# Patient Record
Sex: Male | Born: 1980 | Race: White | Hispanic: No | Marital: Married | Smoking: Current every day smoker
Health system: Southern US, Community
[De-identification: ages and names within clinical notes are randomized; demographics above are authoritative.]

## PROBLEM LIST (undated history)

## (undated) DIAGNOSIS — G4730 Sleep apnea, unspecified: Secondary | ICD-10-CM

## (undated) DIAGNOSIS — F909 Attention-deficit hyperactivity disorder, unspecified type: Secondary | ICD-10-CM

## (undated) DIAGNOSIS — T7840XA Allergy, unspecified, initial encounter: Secondary | ICD-10-CM

## (undated) DIAGNOSIS — F32A Depression, unspecified: Secondary | ICD-10-CM

## (undated) DIAGNOSIS — I301 Infective pericarditis: Secondary | ICD-10-CM

## (undated) DIAGNOSIS — F4310 Post-traumatic stress disorder, unspecified: Secondary | ICD-10-CM

## (undated) DIAGNOSIS — G40909 Epilepsy, unspecified, not intractable, without status epilepticus: Secondary | ICD-10-CM

## (undated) DIAGNOSIS — F329 Major depressive disorder, single episode, unspecified: Secondary | ICD-10-CM

## (undated) DIAGNOSIS — E119 Type 2 diabetes mellitus without complications: Secondary | ICD-10-CM

## (undated) DIAGNOSIS — M722 Plantar fascial fibromatosis: Secondary | ICD-10-CM

## (undated) DIAGNOSIS — G43909 Migraine, unspecified, not intractable, without status migrainosus: Secondary | ICD-10-CM

## (undated) DIAGNOSIS — Z21 Asymptomatic human immunodeficiency virus [HIV] infection status: Secondary | ICD-10-CM

## (undated) DIAGNOSIS — M1990 Unspecified osteoarthritis, unspecified site: Secondary | ICD-10-CM

## (undated) DIAGNOSIS — G4733 Obstructive sleep apnea (adult) (pediatric): Secondary | ICD-10-CM

## (undated) DIAGNOSIS — B20 Human immunodeficiency virus [HIV] disease: Secondary | ICD-10-CM

## (undated) HISTORY — DX: Type 2 diabetes mellitus without complications: E11.9

## (undated) HISTORY — DX: Allergy, unspecified, initial encounter: T78.40XA

## (undated) HISTORY — DX: Depression, unspecified: F32.A

## (undated) HISTORY — DX: Plantar fascial fibromatosis: M72.2

## (undated) HISTORY — DX: Obstructive sleep apnea (adult) (pediatric): G47.33

## (undated) HISTORY — PX: HAND SURGERY: SHX662

## (undated) HISTORY — DX: Sleep apnea, unspecified: G47.30

## (undated) HISTORY — PX: OTHER SURGICAL HISTORY: SHX169

## (undated) HISTORY — DX: Post-traumatic stress disorder, unspecified: F43.10

## (undated) HISTORY — DX: Major depressive disorder, single episode, unspecified: F32.9

## (undated) HISTORY — DX: Asymptomatic human immunodeficiency virus (hiv) infection status: Z21

## (undated) HISTORY — DX: Attention-deficit hyperactivity disorder, unspecified type: F90.9

## (undated) HISTORY — DX: Unspecified osteoarthritis, unspecified site: M19.90

## (undated) HISTORY — DX: Migraine, unspecified, not intractable, without status migrainosus: G43.909

## (undated) HISTORY — DX: Human immunodeficiency virus (HIV) disease: B20

---

## 2015-11-29 ENCOUNTER — Other Ambulatory Visit: Payer: Self-pay

## 2015-11-29 ENCOUNTER — Encounter: Payer: Self-pay | Admitting: Neurology

## 2015-11-29 ENCOUNTER — Ambulatory Visit (INDEPENDENT_AMBULATORY_CARE_PROVIDER_SITE_OTHER): Payer: 59 | Admitting: Neurology

## 2015-11-29 VITALS — BP 124/84 | HR 80 | Resp 20 | Ht 68.0 in | Wt 178.0 lb

## 2015-11-29 DIAGNOSIS — G4733 Obstructive sleep apnea (adult) (pediatric): Secondary | ICD-10-CM | POA: Diagnosis not present

## 2015-11-29 DIAGNOSIS — G4710 Hypersomnia, unspecified: Secondary | ICD-10-CM | POA: Diagnosis not present

## 2015-11-29 DIAGNOSIS — B20 Human immunodeficiency virus [HIV] disease: Secondary | ICD-10-CM

## 2015-11-29 DIAGNOSIS — R0683 Snoring: Secondary | ICD-10-CM

## 2015-11-29 DIAGNOSIS — G4730 Sleep apnea, unspecified: Secondary | ICD-10-CM

## 2015-11-29 DIAGNOSIS — E782 Mixed hyperlipidemia: Secondary | ICD-10-CM

## 2015-11-29 MED ORDER — ELVITEG-COBIC-EMTRICIT-TENOFAF 150-150-200-10 MG PO TABS: 1.0000 | ORAL_TABLET | Freq: Every day | ORAL | Status: DC

## 2015-11-29 NOTE — Progress Notes (Signed)
New transfer. New patient labs needed.  Orders added.  Laurell Josephs, RN

## 2015-11-29 NOTE — Patient Instructions (Signed)

## 2015-11-29 NOTE — Telephone Encounter (Signed)
Patient is transferring care from [place] to [facility].  He will be out of medications in 4 days.   I have received his medical records and scheduled hiim for office visit and labs. Medical records will be in transfer folder.   Laurell Josephs, RN
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
## 2015-12-02 ENCOUNTER — Other Ambulatory Visit: Payer: Self-pay

## 2015-12-06 ENCOUNTER — Telehealth: Payer: Self-pay

## 2015-12-06 NOTE — Telephone Encounter (Signed)
Patient is call ing to say he is going to Primary Care on Tuesday for labs and wants to know if we can add our labs. I explained to the patient the need for speciality labs and it would be better to add labs here at our office  He will come on Tuesday, December 07, 2015.

## 2015-12-07 ENCOUNTER — Other Ambulatory Visit: Payer: 59

## 2015-12-07 ENCOUNTER — Other Ambulatory Visit: Payer: Self-pay

## 2015-12-07 DIAGNOSIS — E782 Mixed hyperlipidemia: Secondary | ICD-10-CM

## 2015-12-07 DIAGNOSIS — B20 Human immunodeficiency virus [HIV] disease: Secondary | ICD-10-CM

## 2015-12-07 DIAGNOSIS — Z136 Encounter for screening for cardiovascular disorders: Secondary | ICD-10-CM

## 2015-12-07 LAB — CBC WITH DIFFERENTIAL/PLATELET
BASOS ABS: 0 10*3/uL (ref 0.0–0.1)
BASOS PCT: 0 % (ref 0–1)
EOS PCT: 3 % (ref 0–5)
Eosinophils Absolute: 0.2 10*3/uL (ref 0.0–0.7)
HCT: 45 % (ref 39.0–52.0)
HEMOGLOBIN: 15.6 g/dL (ref 13.0–17.0)
LYMPHS PCT: 40 % (ref 12–46)
Lymphs Abs: 2.5 10*3/uL (ref 0.7–4.0)
MCH: 30.4 pg (ref 26.0–34.0)
MCHC: 34.7 g/dL (ref 30.0–36.0)
MCV: 87.5 fL (ref 78.0–100.0)
MONO ABS: 0.6 10*3/uL (ref 0.1–1.0)
MONOS PCT: 9 % (ref 3–12)
MPV: 9.8 fL (ref 8.6–12.4)
NEUTROS ABS: 3 10*3/uL (ref 1.7–7.7)
Neutrophils Relative %: 48 % (ref 43–77)
PLATELETS: 285 10*3/uL (ref 150–400)
RBC: 5.14 MIL/uL (ref 4.22–5.81)
RDW: 12.7 % (ref 11.5–15.5)
WBC: 6.3 10*3/uL (ref 4.0–10.5)

## 2015-12-07 LAB — COMPLETE METABOLIC PANEL WITH GFR
ALBUMIN: 4.4 g/dL (ref 3.6–5.1)
ALK PHOS: 58 U/L (ref 40–115)
ALT: 104 U/L — AB (ref 9–46)
AST: 39 U/L (ref 10–40)
BILIRUBIN TOTAL: 0.5 mg/dL (ref 0.2–1.2)
BUN: 15 mg/dL (ref 7–25)
CO2: 29 mmol/L (ref 20–31)
CREATININE: 0.99 mg/dL (ref 0.60–1.35)
Calcium: 9.5 mg/dL (ref 8.6–10.3)
Chloride: 100 mmol/L (ref 98–110)
GFR, Est Non African American: >89 mL/min (ref >=60)
GLUCOSE: 118 mg/dL — AB (ref 65–99)
Potassium: 4.7 mmol/L (ref 3.5–5.3)
SODIUM: 140 mmol/L (ref 135–146)
TOTAL PROTEIN: 7.4 g/dL (ref 6.1–8.1)

## 2015-12-07 LAB — TSH: TSH: 1.17 m[IU]/L (ref 0.40–4.50)

## 2015-12-07 LAB — LIPID PANEL
Cholesterol: 202 mg/dL — ABNORMAL HIGH (ref 125–200)
HDL: 30 mg/dL — ABNORMAL LOW (ref >=40)
LDL CALC: 132 mg/dL — AB (ref <130)
TRIGLYCERIDES: 198 mg/dL — AB (ref <150)
Total CHOL/HDL Ratio: 6.7 Ratio — ABNORMAL HIGH (ref <=5.0)
VLDL: 40 mg/dL — AB (ref <30)

## 2015-12-08 LAB — URINALYSIS
Bilirubin Urine: NEGATIVE
Glucose, UA: NEGATIVE
HGB URINE DIPSTICK: NEGATIVE
Ketones, ur: NEGATIVE
LEUKOCYTES UA: NEGATIVE
NITRITE: NEGATIVE
PH: 6 (ref 5.0–8.0)
PROTEIN: NEGATIVE
Specific Gravity, Urine: 1.023 (ref 1.001–1.035)

## 2015-12-08 LAB — HEPATITIS C ANTIBODY: HCV Ab: NEGATIVE

## 2015-12-08 LAB — RPR

## 2015-12-08 LAB — HEPATITIS A ANTIBODY, TOTAL: HEP A TOTAL AB: REACTIVE — AB

## 2015-12-08 LAB — HEPATITIS B SURFACE ANTIBODY,QUALITATIVE: Hep B S Ab: POSITIVE — AB

## 2015-12-08 LAB — HIV-1 RNA ULTRAQUANT REFLEX TO GENTYP+
HIV 1 RNA Quant: <20 copies/mL (ref <20)
HIV-1 RNA Quant, Log: <1.3 Log copies/mL (ref <1.30)

## 2015-12-08 LAB — URINE CYTOLOGY ANCILLARY ONLY
Chlamydia: NEGATIVE
Neisseria Gonorrhea: NEGATIVE

## 2015-12-08 LAB — T-HELPER CELL (CD4) - (RCID CLINIC ONLY)
CD4 % Helper T Cell: 33 % (ref 33–55)
CD4 T CELL ABS: 830 /uL (ref 400–2700)

## 2015-12-08 LAB — HEPATITIS B CORE ANTIBODY, TOTAL: Hep B Core Total Ab: NONREACTIVE

## 2015-12-08 LAB — HEPATITIS B SURFACE ANTIGEN: Hepatitis B Surface Ag: NEGATIVE

## 2015-12-09 LAB — QUANTIFERON TB GOLD ASSAY (BLOOD)
INTERFERON GAMMA RELEASE ASSAY: NEGATIVE
Mitogen value: >10 IU/mL
QUANTIFERON TB AG MINUS NIL: 0.1 [IU]/mL
Quantiferon Nil Value: 0.05 IU/mL
TB AG VALUE: 0.15 [IU]/mL

## 2015-12-15 ENCOUNTER — Encounter: Payer: Self-pay | Admitting: Internal Medicine

## 2015-12-15 DIAGNOSIS — Z87891 Personal history of nicotine dependence: Secondary | ICD-10-CM | POA: Insufficient documentation

## 2015-12-15 DIAGNOSIS — F4310 Post-traumatic stress disorder, unspecified: Secondary | ICD-10-CM | POA: Insufficient documentation

## 2015-12-15 DIAGNOSIS — F909 Attention-deficit hyperactivity disorder, unspecified type: Secondary | ICD-10-CM | POA: Insufficient documentation

## 2015-12-15 DIAGNOSIS — E785 Hyperlipidemia, unspecified: Secondary | ICD-10-CM | POA: Insufficient documentation

## 2015-12-15 DIAGNOSIS — G4730 Sleep apnea, unspecified: Secondary | ICD-10-CM | POA: Insufficient documentation

## 2015-12-15 DIAGNOSIS — R739 Hyperglycemia, unspecified: Secondary | ICD-10-CM | POA: Insufficient documentation

## 2015-12-15 DIAGNOSIS — B20 Human immunodeficiency virus [HIV] disease: Secondary | ICD-10-CM | POA: Insufficient documentation

## 2015-12-15 LAB — HLA B*5701: HLA-B 5701 W/RFLX HLA-B HIGH: NEGATIVE

## 2015-12-16 ENCOUNTER — Encounter: Payer: Self-pay | Admitting: Internal Medicine

## 2015-12-16 ENCOUNTER — Ambulatory Visit: Payer: 59 | Admitting: *Deleted

## 2015-12-16 ENCOUNTER — Ambulatory Visit (INDEPENDENT_AMBULATORY_CARE_PROVIDER_SITE_OTHER): Payer: 59 | Admitting: Internal Medicine

## 2015-12-16 VITALS — BP 134/87 | HR 77 | Temp 98.4°F | Ht 68.0 in | Wt 192.0 lb

## 2015-12-16 DIAGNOSIS — R739 Hyperglycemia, unspecified: Secondary | ICD-10-CM

## 2015-12-16 DIAGNOSIS — E785 Hyperlipidemia, unspecified: Secondary | ICD-10-CM | POA: Diagnosis not present

## 2015-12-16 DIAGNOSIS — F4310 Post-traumatic stress disorder, unspecified: Secondary | ICD-10-CM

## 2015-12-16 DIAGNOSIS — G4730 Sleep apnea, unspecified: Secondary | ICD-10-CM | POA: Diagnosis not present

## 2015-12-16 DIAGNOSIS — B20 Human immunodeficiency virus [HIV] disease: Secondary | ICD-10-CM | POA: Diagnosis not present

## 2015-12-16 DIAGNOSIS — Z87891 Personal history of nicotine dependence: Secondary | ICD-10-CM

## 2015-12-16 NOTE — Assessment & Plan Note (Signed)
I talked to him about our approach to his healthcare. We have a multidisciplinary team including behavioral health counselors. He did meet with Jenel Lucks, our behavioral health counselor today and agreed to schedule a follow-up visit with her.
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
## 2015-12-16 NOTE — Assessment & Plan Note (Addendum)
His HIV infection is under excellent control. He has had complete CD4 reconstitution. He will continue on Genvoya and follow-up here after lab work in 6 months.  His wife [name] has been tested and is HIV negative. They do not use condoms. They did discuss the use of PrEP with his doctor in [place] but she never started it. I encouraged him to have her come to his next visit so we can discuss this again.

## 2015-12-16 NOTE — Assessment & Plan Note (Signed)
He has dyslipidemia. He has never been on therapy for this. Given his HIV infection and family history he will probably benefit from statin therapy. I also talked him about the importance of lifestyle modification for his lipids and mild hyperglycemia. We will consider statin therapy at the time of his next visit and he will also discuss this with his primary care physician.

## 2016-01-05 ENCOUNTER — Ambulatory Visit: Payer: 59 | Admitting: *Deleted

## 2016-01-05 DIAGNOSIS — F411 Generalized anxiety disorder: Secondary | ICD-10-CM

## 2016-01-05 DIAGNOSIS — F430 Acute stress reaction: Principal | ICD-10-CM
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
## 2016-01-10 ENCOUNTER — Ambulatory Visit: Payer: 59 | Admitting: *Deleted

## 2016-01-10 DIAGNOSIS — F411 Generalized anxiety disorder: Secondary | ICD-10-CM
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
## 2016-01-28 ENCOUNTER — Ambulatory Visit: Payer: 59 | Admitting: *Deleted

## 2016-01-31 ENCOUNTER — Ambulatory Visit: Payer: 59 | Admitting: *Deleted

## 2016-01-31 DIAGNOSIS — F411 Generalized anxiety disorder: Secondary | ICD-10-CM

## 2016-01-31 DIAGNOSIS — Z605 Target of (perceived) adverse discrimination and persecution: Secondary | ICD-10-CM

## 2016-01-31 NOTE — BH Specialist Note (Signed)
Algernon HuxleyGlen was present for his scheduled appointment today with counselor.  Patient was oriented times four with good affect and dress.  Patient was alert and talkative.  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  Patient shared that he cheats chronically no matter who he is with, watches excessive porn, is dishonest about his whereabouts at times, feels need to be satisfied sexually multiple times a day etc.  Counselor provided support and encouragement with patient.  Counselor asked patient if he feels that is sexual choices are making his life unmanageable and or if he feels powerless over how he acts sexually.  Patient said "all he above".  Counselor encouraged patient to discuss with his wife the boundaries and deal breakers regarding him using porn as a coping mechanism when his wife is not feeling well and does not want to engage in sexual intimacy.  Patient agreed he should have a conversation with his wife and make sure they are on the same page. Counselor educated patient on genetic predisposition to emotional dysregulation and or fluctuating levels of hormones.  Patient indicated that he would like to proceed with counseling to address both these areas in his life.  Patient made another appointment for two weeks out.   Jenel LucksJodi Sitlaly Gudiel, MA [facility]

## 2016-02-09 ENCOUNTER — Encounter: Payer: Self-pay | Admitting: *Deleted

## 2016-02-16 ENCOUNTER — Ambulatory Visit: Payer: 59 | Admitting: *Deleted

## 2016-02-16 DIAGNOSIS — F411 Generalized anxiety disorder: Secondary | ICD-10-CM
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
## 2016-02-28 ENCOUNTER — Other Ambulatory Visit: Payer: Self-pay | Admitting: *Deleted

## 2016-02-28 DIAGNOSIS — B20 Human immunodeficiency virus [HIV] disease: Secondary | ICD-10-CM

## 2016-02-28 MED ORDER — ELVITEG-COBIC-EMTRICIT-TENOFAF 150-150-200-10 MG PO TABS
1.0000 | ORAL_TABLET | Freq: Every day | ORAL | Status: DC
Start: 2016-02-28 — End: 2016-06-06

## 2016-02-29 ENCOUNTER — Ambulatory Visit: Payer: 59 | Admitting: *Deleted

## 2016-03-08 ENCOUNTER — Ambulatory Visit: Payer: 59 | Admitting: *Deleted

## 2016-03-15 ENCOUNTER — Ambulatory Visit: Payer: 59 | Admitting: *Deleted

## 2016-05-23 ENCOUNTER — Other Ambulatory Visit: Payer: 59

## 2016-05-23 DIAGNOSIS — B20 Human immunodeficiency virus [HIV] disease: Secondary | ICD-10-CM

## 2016-05-23 LAB — CBC
HCT: 44.9 % (ref 38.5–50.0)
HEMOGLOBIN: 15.3 g/dL (ref 13.2–17.1)
MCH: 30.8 pg (ref 27.0–33.0)
MCHC: 34.1 g/dL (ref 32.0–36.0)
MCV: 90.5 fL (ref 80.0–100.0)
MPV: 10 fL (ref 7.5–12.5)
PLATELETS: 279 10*3/uL (ref 140–400)
RBC: 4.96 MIL/uL (ref 4.20–5.80)
RDW: 13 % (ref 11.0–15.0)
WBC: 8.2 10*3/uL (ref 3.8–10.8)

## 2016-05-24 LAB — COMPREHENSIVE METABOLIC PANEL
ALBUMIN: 4.7 g/dL (ref 3.6–5.1)
ALT: 61 U/L — ABNORMAL HIGH (ref 9–46)
AST: 32 U/L (ref 10–40)
Alkaline Phosphatase: 57 U/L (ref 40–115)
BILIRUBIN TOTAL: 0.4 mg/dL (ref 0.2–1.2)
BUN: 13 mg/dL (ref 7–25)
CHLORIDE: 101 mmol/L (ref 98–110)
CO2: 29 mmol/L (ref 20–31)
CREATININE: 1.12 mg/dL (ref 0.60–1.35)
Calcium: 9.6 mg/dL (ref 8.6–10.3)
GLUCOSE: 98 mg/dL (ref 65–99)
Potassium: 4.5 mmol/L (ref 3.5–5.3)
SODIUM: 139 mmol/L (ref 135–146)
Total Protein: 7.5 g/dL (ref 6.1–8.1)

## 2016-05-24 LAB — RPR

## 2016-05-24 LAB — LIPID PANEL
Cholesterol: 187 mg/dL (ref 125–200)
HDL: 27 mg/dL — ABNORMAL LOW (ref >=40)
LDL CALC: 87 mg/dL (ref <130)
TRIGLYCERIDES: 365 mg/dL — AB (ref <150)
Total CHOL/HDL Ratio: 6.9 Ratio — ABNORMAL HIGH (ref <=5.0)
VLDL: 73 mg/dL — AB (ref <30)

## 2016-05-25 LAB — HIV-1 RNA QUANT-NO REFLEX-BLD

## 2016-05-25 LAB — T-HELPER CELL (CD4) - (RCID CLINIC ONLY)
CD4 % Helper T Cell: 34 % (ref 33–55)
CD4 T Cell Abs: 1040 /uL (ref 400–2700)

## 2016-06-06 ENCOUNTER — Encounter: Payer: Self-pay | Admitting: Internal Medicine

## 2016-06-06 ENCOUNTER — Ambulatory Visit (INDEPENDENT_AMBULATORY_CARE_PROVIDER_SITE_OTHER): Payer: 59 | Admitting: Internal Medicine

## 2016-06-06 DIAGNOSIS — B20 Human immunodeficiency virus [HIV] disease: Secondary | ICD-10-CM | POA: Diagnosis not present

## 2016-06-06 DIAGNOSIS — F4310 Post-traumatic stress disorder, unspecified: Secondary | ICD-10-CM | POA: Diagnosis not present

## 2016-06-06 DIAGNOSIS — R634 Abnormal weight loss: Secondary | ICD-10-CM | POA: Diagnosis not present

## 2016-06-06 MED ORDER — ELVITEG-COBIC-EMTRICIT-TENOFAF 150-150-200-10 MG PO TABS
1.0000 | ORAL_TABLET | Freq: Every day | ORAL | 11 refills | Status: DC
Start: 2016-06-06 — End: 2016-11-21

## 2016-06-06 NOTE — Assessment & Plan Note (Signed)
I suspect his unintentional weight loss is due to the stress and depression. However, that may not be a bad thing. His glucose has normalized and carrying less weight may help prevent more wear and tear from arthritis on his left knee. I did encourage him to start getting some regular exercise.

## 2016-06-06 NOTE — Progress Notes (Signed)
Patient Active Problem List   Diagnosis Date Noted  . HIV disease (HCC) 12/15/2015    Priority: High  . Unintentional weight loss 06/06/2016  . Dyslipidemia 12/15/2015  . Hyperglycemia 12/15/2015  . Sleep apnea 12/15/2015  . Former smoker 12/15/2015  . Attention deficit disorder with hyperactivity 12/15/2015  . PTSD (post-traumatic stress disorder) 12/15/2015    Patient's Medications  New Prescriptions   No medications on file  Previous Medications   BUTALBITAL-ACETAMINOPHEN-CAFFEINE (FIORICET WITH CODEINE) 50-325-40-30 MG CAPSULE    Take 1 capsule by mouth every 4 (four) hours as needed for headache.   MONTELUKAST (SINGULAIR) 10 MG TABLET    Take 10 mg by mouth at bedtime.   MULTIPLE VITAMIN (MULTIVITAMIN) TABLET    Take 1 tablet by mouth daily.   ZINC GLUCONATE 50 MG TABLET    Take 50 mg by mouth daily.  Modified Medications   Modified Medication Previous Medication   ELVITEGRAVIR-COBICISTAT-EMTRICITABINE-TENOFOVIR (GENVOYA) 150-150-200-10 MG TABS TABLET elvitegravir-cobicistat-emtricitabine-tenofovir (GENVOYA) 150-150-200-10 MG TABS tablet      Take 1 tablet by mouth daily with breakfast.    Take 1 tablet by mouth daily with breakfast.  Discontinued Medications   No medications on file    Subjective: Jason Sheppard is in for his routine HIV follow-up visit. He denies missing any doses of his Genvoya since his last visit. He has been under a great deal of stress recently. He and his wife are divorcing and [place] back to [place]. They had initially agreed that it would be an amicable, uncontrasted divorce but it has turned out to be quite messy. He tells me that she has been posting all sorts of bad things about him on Facebook. He has been stressed and depressed. Last week he thought he was having an outbreak of shingles on his left trunk but the small red spot that he noted went away in 24 hours. He has not been eating well and has lost some weight. He is still not  getting any regular exercise although he wants to. He tells me that he is afraid that he might hurt his knee if he started exercising and then not be able to work.  Review of Systems: Review of Systems  Constitutional: Positive for weight loss. Negative for chills, diaphoresis, fever and malaise/fatigue.  HENT: Negative for sore throat.   Respiratory: Negative for cough, sputum production and shortness of breath.   Cardiovascular: Negative for chest pain.  Gastrointestinal: Negative for abdominal pain, diarrhea, heartburn, nausea and vomiting.  Genitourinary: Negative for dysuria.  Musculoskeletal: Positive for joint pain. Negative for myalgias.  Skin: Negative for rash.  Neurological: Negative for dizziness and headaches.  Psychiatric/Behavioral: Positive for depression. Negative for substance abuse. The patient is not nervous/anxious.     Past Medical History:  Diagnosis Date  . Allergy   . Attention deficit disorder with hyperactivity   . Depression   . HIV (human immunodeficiency virus infection) (HCC)   . Migraine   . OSA (obstructive sleep apnea)   . Plantar fascial fibromatosis   . PTSD (post-traumatic stress disorder)     Social History  Substance Use Topics  . Smoking status: Former Smoker    Quit date: 10/23/2005  . Smokeless tobacco: Never Used  . Alcohol use No    Family History  Problem Relation Age of Onset  . Diabetes Mother   . Heart disease Mother   . Hypertension Mother   . Diabetes Father   .  Heart disease Father   . Hyperlipidemia Father     Allergies  Allergen Reactions  . Iodine   . Latex   . Sulfa Antibiotics     Objective:  Vitals:   06/06/16 1606  BP: 134/85  Pulse: 88  Temp: 97.5 F (36.4 C)  TempSrc: Oral  Weight: 177 lb (80.3 kg)   Body mass index is 26.91 kg/m.  Physical Exam  Constitutional: He is oriented to person, place, and time.  He is calm but obviously upset when talking about his divorce. He is tearful at times.  He has lost 15 pounds since his last visit.  HENT:  Mouth/Throat: No oropharyngeal exudate.  Eyes: Conjunctivae are normal.  Cardiovascular: Normal rate and regular rhythm.   No murmur heard. Pulmonary/Chest: Effort normal and breath sounds normal. He has no rales.  Abdominal: Soft. He exhibits no mass. There is no tenderness.  Musculoskeletal: Normal range of motion. He exhibits no edema or tenderness.  Neurological: He is alert and oriented to person, place, and time.  Skin: No rash noted.  Psychiatric: Affect normal.    Lab Results Lab Results  Component Value Date   WBC 8.2 05/23/2016   HGB 15.3 05/23/2016   HCT 44.9 05/23/2016   MCV 90.5 05/23/2016   PLT 279 05/23/2016    Lab Results  Component Value Date   CREATININE 1.12 05/23/2016   BUN 13 05/23/2016   NA 139 05/23/2016   K 4.5 05/23/2016   CL 101 05/23/2016   CO2 29 05/23/2016    Lab Results  Component Value Date   ALT 61 (H) 05/23/2016   AST 32 05/23/2016   ALKPHOS 57 05/23/2016   BILITOT 0.4 05/23/2016    Lab Results  Component Value Date   CHOL 187 05/23/2016   HDL 27 (L) 05/23/2016   LDLCALC 87 05/23/2016   TRIG 365 (H) 05/23/2016   CHOLHDL 6.9 (H) 05/23/2016   HIV 1 RNA Quant (copies/mL)  Date Value  05/23/2016 <20  12/07/2015 <20   CD4 T Cell Abs (/uL)  Date Value  05/23/2016 1,040  12/07/2015 830     Problem List Items Addressed This Visit      High   HIV disease (HCC)    His infection remains under excellent control. He will continue Genvoya in follow-up after lab work in 6 months.      Relevant Medications   elvitegravir-cobicistat-emtricitabine-tenofovir (GENVOYA) 150-150-200-10 MG TABS tablet   Other Relevant Orders   T-helper cell (CD4)- ([facility] clinic only)   HIV 1 RNA quant-no reflex-bld   CBC   Comprehensive metabolic panel   Lipid panel   RPR     Unprioritized   PTSD (post-traumatic stress disorder)    He has been under a great deal of stress recently and is feeling  depressed. I had him meet with our behavioral health counselor, Jason Sheppard, today.      Unintentional weight loss    I suspect his unintentional weight loss is due to the stress and depression. However, that may not be a bad thing. His glucose has normalized and carrying less weight may help prevent more wear and tear from arthritis on his left knee. I did encourage him to start getting some regular exercise.       Other Visit Diagnoses    Human immunodeficiency virus (HIV) disease (HCC)       Relevant Medications   elvitegravir-cobicistat-emtricitabine-tenofovir (GENVOYA) 150-150-200-10 MG TABS tablet        Jason AstersJohn Pearlee Arvizu,  MD [facility] [phone] pager   [phone] cell 06/06/2016, 4:53 PM

## 2016-06-06 NOTE — Assessment & Plan Note (Signed)
His infection remains under excellent control. He will continue Genvoya in follow-up after lab work in 6 months. 

## 2016-06-06 NOTE — Assessment & Plan Note (Signed)
He has been under a great deal of stress recently and is feeling depressed. I had him meet with our behavioral health counselor, Jenel LucksJodi Herring, today.

## 2016-09-27 ENCOUNTER — Ambulatory Visit: Payer: 59 | Admitting: *Deleted

## 2016-09-27 DIAGNOSIS — F6081 Narcissistic personality disorder: Secondary | ICD-10-CM

## 2016-09-29 NOTE — Progress Notes (Signed)
Algernon HuxleyGlen showed today for his scheduled appointment. Patient was oriented times four with flat affect but talkative.  Patient was alert and shared freely.  Patient indicated that he has recently started having nightmares and lively dreams. Patient has a pattern of speaking of terms of victimization and entitlement.  Today was no different.  Counselor has not seen or heard from patient in several months.  Patient was a no show the last two appointments. Patient communicated today that he was divorced, in love, living with a woman now and was getting ready to marry her.  Counselor was taken back as patient was married and had just bought a new house with his wife at the last appointment which was in April.  Patient shared that he got divorced at the end of May, started dating a new woman, moved in together after 2 months dating, and was going to marry her.  Counselor inquired with patient as to if he had truly thought this through and did he not think he was moving fast.  Counselor educated patient about the need to heal prior to starting another relationship with someone, that this is a loss and adequate grieving time if necessary to be whole emotionally.  Patient said that he did not feel counselor was being supported of him.  Counselor explained that the role of the counselor is too try and work on the stated problem which is having nightmares recently.  Counselor educated patient that personal conflict with self can cause serious negative internalization and can trigger the nightmares he is having. Counselor shared with patient that she is working a notice and would no longer be his therapist.  Counselor referred patient to the other therapist on staff.  Patient agreed to meet with him to see if he clicks with him.   Jenel LucksJodi Shelli Portilla, MA, LPC [facility]

## 2016-11-14 ENCOUNTER — Other Ambulatory Visit: Payer: 59

## 2016-11-21 ENCOUNTER — Other Ambulatory Visit: Payer: Self-pay

## 2016-11-21 ENCOUNTER — Telehealth: Payer: Self-pay

## 2016-11-21 DIAGNOSIS — B20 Human immunodeficiency virus [HIV] disease: Secondary | ICD-10-CM

## 2016-11-21 MED ORDER — ELVITEG-COBIC-EMTRICIT-TENOFAF 150-150-200-10 MG PO TABS: 1.0000 | ORAL_TABLET | Freq: Every day | ORAL | 6 refills | Status: DC

## 2016-11-21 NOTE — Telephone Encounter (Signed)
Patient request new pharmacy with new script.   Medication sent to pharmacy.

## 2016-11-21 NOTE — Telephone Encounter (Signed)
Patient left message on voicemail. His insurance has changed and he will need a new script sent to pharmacy.  [facility] [address]   I will send Genvoya to pharmacy as requested.    Laurell Josephsammy K Exavior Kimmons, RN

## 2016-11-28 ENCOUNTER — Ambulatory Visit (INDEPENDENT_AMBULATORY_CARE_PROVIDER_SITE_OTHER): Payer: Managed Care, Other (non HMO) | Admitting: Internal Medicine

## 2016-11-28 ENCOUNTER — Encounter: Payer: Self-pay | Admitting: Internal Medicine

## 2016-11-28 DIAGNOSIS — R634 Abnormal weight loss: Secondary | ICD-10-CM | POA: Diagnosis not present

## 2016-11-28 DIAGNOSIS — F4310 Post-traumatic stress disorder, unspecified: Secondary | ICD-10-CM

## 2016-11-28 DIAGNOSIS — B20 Human immunodeficiency virus [HIV] disease: Secondary | ICD-10-CM

## 2016-11-28 LAB — LIPID PANEL
CHOLESTEROL: 178 mg/dL (ref <200)
HDL: 27 mg/dL — ABNORMAL LOW (ref >40)
LDL CALC: 75 mg/dL (ref <100)
TRIGLYCERIDES: 382 mg/dL — AB (ref <150)
Total CHOL/HDL Ratio: 6.6 Ratio — ABNORMAL HIGH (ref <5.0)
VLDL: 76 mg/dL — ABNORMAL HIGH (ref <30)

## 2016-11-28 LAB — CBC
HCT: 46.8 % (ref 38.5–50.0)
Hemoglobin: 16.2 g/dL (ref 13.2–17.1)
MCH: 31.1 pg (ref 27.0–33.0)
MCHC: 34.6 g/dL (ref 32.0–36.0)
MCV: 89.8 fL (ref 80.0–100.0)
MPV: 9.6 fL (ref 7.5–12.5)
PLATELETS: 257 10*3/uL (ref 140–400)
RBC: 5.21 MIL/uL (ref 4.20–5.80)
RDW: 13.7 % (ref 11.0–15.0)
WBC: 11 10*3/uL — ABNORMAL HIGH (ref 3.8–10.8)

## 2016-11-28 LAB — COMPREHENSIVE METABOLIC PANEL
ALBUMIN: 4.8 g/dL (ref 3.6–5.1)
ALK PHOS: 56 U/L (ref 40–115)
ALT: 29 U/L (ref 9–46)
AST: 22 U/L (ref 10–40)
BUN: 9 mg/dL (ref 7–25)
CO2: 30 mmol/L (ref 20–31)
CREATININE: 1.1 mg/dL (ref 0.60–1.35)
Calcium: 9.4 mg/dL (ref 8.6–10.3)
Chloride: 100 mmol/L (ref 98–110)
GLUCOSE: 97 mg/dL (ref 65–99)
Potassium: 4 mmol/L (ref 3.5–5.3)
SODIUM: 139 mmol/L (ref 135–146)
Total Bilirubin: 0.5 mg/dL (ref 0.2–1.2)
Total Protein: 7.5 g/dL (ref 6.1–8.1)

## 2016-11-29 LAB — RPR

## 2016-11-29 NOTE — Assessment & Plan Note (Signed)
His infection has been under excellent long-term control. He will continue Genvoya and get repeat lab work today. He will follow-up in 6 months. I did talk to him about having his new partner tested on a regular basis here with free partner testing. I also talked to him about condom use and the possibility of having her take Truvada for PrEP.

## 2016-11-29 NOTE — Assessment & Plan Note (Signed)
Although he is not expressing any recent depression, anger or anxiety I am concerned about his rapid rebound relationships. He was seen Jenel LucksJodi Herring our behavioral health counselor before she left recently and she also expressed concern about this. He does not see this as a problem.

## 2016-11-29 NOTE — Assessment & Plan Note (Signed)
He has not had any further weight loss.

## 2016-11-29 NOTE — Progress Notes (Signed)
[facility]  Patient Active Problem List   Diagnosis Date Noted  . HIV disease (HCC) 12/15/2015    Priority: High  . Unintentional weight loss 06/06/2016  . Dyslipidemia 12/15/2015  . Hyperglycemia 12/15/2015  . Sleep apnea 12/15/2015  . Former smoker 12/15/2015  . Attention deficit disorder with hyperactivity 12/15/2015  . PTSD (post-traumatic stress disorder) 12/15/2015    Patient's Medications  New Prescriptions   No medications on file  Previous Medications   BUTALBITAL-ACETAMINOPHEN-CAFFEINE (FIORICET WITH CODEINE) 50-325-40-30 MG CAPSULE    Take 1 capsule by mouth every 4 (four) hours as needed for headache.   ELVITEGRAVIR-COBICISTAT-EMTRICITABINE-TENOFOVIR (GENVOYA) 150-150-200-10 MG TABS TABLET    Take 1 tablet by mouth daily with breakfast.   MONTELUKAST (SINGULAIR) 10 MG TABLET    Take 10 mg by mouth at bedtime.   MULTIPLE VITAMIN (MULTIVITAMIN) TABLET    Take 1 tablet by mouth daily.   ZINC GLUCONATE 50 MG TABLET    Take 50 mg by mouth daily.  Modified Medications   No medications on file  Discontinued Medications   No medications on file    Subjective: Jason Sheppard is in for his routine HIV follow-up visit. He has had no problems obtaining, taking or tolerating his Genvoya. However, he did change insurance and when he got to the pharmacy in January he was told that there would be a $2000 co-pay. He went online and found a [employer] account planner at co-pay card and only had to pay $45 which was manageable. He does not recall missing any doses. He does not have any side effects. He went through with his divorce with his wife but it was quite acrimonious. He is already in a new relationship with another woman. I have talked about being sexually active but has not acted on that yet. She is HIV negative. He states that his mood is good. He has not had any unusual problems with depression, anger or anxiety recently.  Review of Systems: Review of Systems    Constitutional: Negative for chills, diaphoresis, fever, malaise/fatigue and weight loss.  HENT: Negative for sore throat.   Respiratory: Negative for cough, sputum production and shortness of breath.   Cardiovascular: Negative for chest pain.  Gastrointestinal: Negative for abdominal pain, diarrhea, heartburn, nausea and vomiting.  Genitourinary: Negative for dysuria and frequency.  Musculoskeletal: Negative for joint pain and myalgias.  Skin: Negative for rash.  Neurological: Negative for dizziness and headaches.  Psychiatric/Behavioral: Negative for depression and substance abuse. The patient is not nervous/anxious.     Past Medical History:  Diagnosis Date  . Allergy   . Attention deficit disorder with hyperactivity   . Depression   . HIV (human immunodeficiency virus infection) (HCC)   . Migraine   . OSA (obstructive sleep apnea)   . Plantar fascial fibromatosis   . PTSD (post-traumatic stress disorder)     Social History  Substance Use Topics  . Smoking status: Former Smoker    Packs/day: 0.50    Types: Cigarettes    Quit date: 10/23/2005  . Smokeless tobacco: Never Used  . Alcohol use No    Family History  Problem Relation Age of Onset  . Diabetes Mother   . Heart disease Mother   . Hypertension Mother   . Diabetes Father   . Heart disease Father   . Hyperlipidemia Father     Allergies  Allergen Reactions  . Iodine   . Latex   . Sulfa Antibiotics  Objective: Vitals:   11/28/16 1049  BP: (!) 137/94  Pulse: 81  Temp: 98.3 F (36.8 C)  TempSrc: Oral  Weight: 179 lb 12 oz (81.5 kg)  Height: 5\' 8"  (1.727 m)   Body mass index is 27.33 kg/m.  Physical Exam  Constitutional: He is oriented to person, place, and time.  He is in good spirits.  HENT:  Mouth/Throat: No oropharyngeal exudate.  Eyes: Conjunctivae are normal.  Cardiovascular: Normal rate and regular rhythm.   No murmur heard. Pulmonary/Chest: Effort normal and breath sounds normal.   Abdominal: Soft. He exhibits no mass. There is no tenderness.  Musculoskeletal: Normal range of motion.  Neurological: He is alert and oriented to person, place, and time.  Skin: No rash noted.  Psychiatric: Mood and affect normal.    Lab Results    Problem List Items Addressed This Visit      High   HIV disease (HCC)    His infection has been under excellent long-term control. He will continue Genvoya and get repeat lab work today. He will follow-up in 6 months. I did talk to him about having his new partner tested on a regular basis here with free partner testing. I also talked to him about condom use and the possibility of having her take Truvada for PrEP.      Relevant Orders   T-helper cell (CD4)- ([facility] clinic only)   HIV 1 RNA quant-no reflex-bld (Completed)   CBC (Completed)   Comprehensive metabolic panel (Completed)   RPR (Completed)   Lipid panel (Completed)     Unprioritized   PTSD (post-traumatic stress disorder)    Although he is not expressing any recent depression, anger or anxiety I am concerned about his rapid rebound relationships. He was seen Jenel LucksJodi Herring our behavioral health counselor before she left recently and she also expressed concern about this. He does not see this as a problem.      Unintentional weight loss    He has not had any further weight loss.          Jason AstersJohn Jaymason Ledesma, MD [facility] [phone] pager   [phone] cell 11/29/2016, 1:00 PM

## 2016-11-30 LAB — T-HELPER CELL (CD4) - (RCID CLINIC ONLY)
CD4 T CELL HELPER: 33 % (ref 33–55)
CD4 T Cell Abs: 820 /uL (ref 400–2700)

## 2016-12-01 LAB — HIV-1 RNA QUANT-NO REFLEX-BLD
HIV 1 RNA QUANT: NOT DETECTED {copies}/mL
HIV-1 RNA QUANT, LOG: NOT DETECTED {Log_copies}/mL

## 2017-01-09 ENCOUNTER — Encounter (HOSPITAL_COMMUNITY): Payer: Self-pay | Admitting: Emergency Medicine

## 2017-01-09 ENCOUNTER — Emergency Department (HOSPITAL_COMMUNITY)
Admission: EM | Admit: 2017-01-09 | Discharge: 2017-01-09 | Disposition: A | Discharge status: Home / Self Care | Payer: Managed Care, Other (non HMO) | Attending: Emergency Medicine | Admitting: Emergency Medicine

## 2017-01-09 DIAGNOSIS — F419 Anxiety disorder, unspecified: Secondary | ICD-10-CM | POA: Diagnosis not present

## 2017-01-09 DIAGNOSIS — Z79899 Other long term (current) drug therapy: Secondary | ICD-10-CM | POA: Diagnosis not present

## 2017-01-09 DIAGNOSIS — Z791 Long term (current) use of non-steroidal anti-inflammatories (NSAID): Secondary | ICD-10-CM | POA: Diagnosis not present

## 2017-01-09 DIAGNOSIS — Z21 Asymptomatic human immunodeficiency virus [HIV] infection status: Secondary | ICD-10-CM | POA: Insufficient documentation

## 2017-01-09 DIAGNOSIS — F17210 Nicotine dependence, cigarettes, uncomplicated: Secondary | ICD-10-CM | POA: Insufficient documentation

## 2017-01-09 LAB — COMPREHENSIVE METABOLIC PANEL
ALT: 35 U/L (ref 17–63)
ANION GAP: 8 (ref 5–15)
AST: 28 U/L (ref 15–41)
Albumin: 4.7 g/dL (ref 3.5–5.0)
Alkaline Phosphatase: 59 U/L (ref 38–126)
BUN: 17 mg/dL (ref 6–20)
CALCIUM: 9.4 mg/dL (ref 8.9–10.3)
CO2: 30 mmol/L (ref 22–32)
CREATININE: 1.07 mg/dL (ref 0.61–1.24)
Chloride: 101 mmol/L (ref 101–111)
GFR calc Af Amer: >60 mL/min (ref >60)
Glucose, Bld: 109 mg/dL — ABNORMAL HIGH (ref 65–99)
Potassium: 4 mmol/L (ref 3.5–5.1)
SODIUM: 139 mmol/L (ref 135–145)
TOTAL PROTEIN: 7.9 g/dL (ref 6.5–8.1)
Total Bilirubin: 1 mg/dL (ref 0.3–1.2)

## 2017-01-09 LAB — RAPID URINE DRUG SCREEN, HOSP PERFORMED
AMPHETAMINES: NOT DETECTED
BARBITURATES: NOT DETECTED
BENZODIAZEPINES: NOT DETECTED
Cocaine: NOT DETECTED
Opiates: NOT DETECTED
Tetrahydrocannabinol: NOT DETECTED

## 2017-01-09 LAB — CBC WITH DIFFERENTIAL/PLATELET
Basophils Absolute: 0 10*3/uL (ref 0.0–0.1)
Basophils Relative: 0 %
EOS ABS: 0.1 10*3/uL (ref 0.0–0.7)
EOS PCT: 1 %
HCT: 45.8 % (ref 39.0–52.0)
Hemoglobin: 16.4 g/dL (ref 13.0–17.0)
Lymphocytes Relative: 20 %
Lymphs Abs: 2.7 10*3/uL (ref 0.7–4.0)
MCH: 31.5 pg (ref 26.0–34.0)
MCHC: 35.8 g/dL (ref 30.0–36.0)
MCV: 87.9 fL (ref 78.0–100.0)
MONO ABS: 1 10*3/uL (ref 0.1–1.0)
Monocytes Relative: 7 %
Neutro Abs: 9.6 10*3/uL — ABNORMAL HIGH (ref 1.7–7.7)
Neutrophils Relative %: 72 %
PLATELETS: 291 10*3/uL (ref 150–400)
RBC: 5.21 MIL/uL (ref 4.22–5.81)
RDW: 12.7 % (ref 11.5–15.5)
WBC: 13.4 10*3/uL — AB (ref 4.0–10.5)

## 2017-01-09 LAB — ETHANOL: Alcohol, Ethyl (B): <5 mg/dL (ref <5)

## 2017-01-09 MED ORDER — LORAZEPAM 1 MG PO TABS
1.0000 mg | ORAL_TABLET | Freq: Once | ORAL | Status: AC
  Administered 2017-01-09: 1 mg via ORAL
  Filled 2017-01-09: qty 1

## 2017-01-09 NOTE — ED Notes (Addendum)
[facility] reported pt is on list to be assessed and reports telepsych should be completed within the hour.

## 2017-01-09 NOTE — ED Provider Notes (Signed)
[facility]-EM[facility]Y DEPT Provider Note   CSN: [id number] Arrival date & time: 01/09/17  1032   By signing my name below, I, Jason Sheppard, attest that this documentation has been prepared under the direction and in the presence of Bethann BerkshireJoseph Cheila Wickstrom, MD. Electronically Signed: Bobbie Stackhristopher Sheppard, Scribe. 01/09/17. 12:04 PM. History   Chief Complaint Chief Complaint  Patient presents with  . Anxiety   HPI Comments: Jason Sheppard is a 36 y.o. male who presents to the Emergency Department complaining of anxiety problems for the past couple of days. The patient states that he has a hx of PTSD from his time in the [employer]. He was told to come to the ED yesterday by police but he decided to wait. He denies any suicidal or homicidal ideas. He also denies any vomiting. He states that he has a hx of night terrors and is afraid that if he doesn't seek help something bad might happen.  The history is provided by the patient. No language interpreter was used.  Anxiety  This is a chronic problem. The current episode started yesterday. The problem has been gradually worsening. Pertinent negatives include no chest pain, no abdominal pain and no headaches. The symptoms are aggravated by stress. Nothing relieves the symptoms.    Past Medical History:  Diagnosis Date  . Allergy   . Attention deficit disorder with hyperactivity   . Depression   . HIV (human immunodeficiency virus infection) (HCC)   . Migraine   . OSA (obstructive sleep apnea)   . Plantar fascial fibromatosis   . PTSD (post-traumatic stress disorder)     Patient Active Problem List   Diagnosis Date Noted  . Unintentional weight loss 06/06/2016  . HIV disease (HCC) 12/15/2015  . Dyslipidemia 12/15/2015  . Hyperglycemia 12/15/2015  . Sleep apnea 12/15/2015  . Former smoker 12/15/2015  . Attention deficit disorder with hyperactivity 12/15/2015  . PTSD (post-traumatic stress disorder) 12/15/2015    Past Surgical History:  Procedure  Laterality Date  . HAND SURGERY    . shrapnel removal         Home Medications    Prior to Admission medications   Medication Sig Start Date End Date Taking? Authorizing Provider  butalbital-acetaminophen-caffeine (FIORICET WITH CODEINE) 50-325-40-30 MG capsule Take 1 capsule by mouth every 4 (four) hours as needed for headache.    Historical Provider, MD  elvitegravir-cobicistat-emtricitabine-tenofovir (GENVOYA) 150-150-200-10 MG TABS tablet Take 1 tablet by mouth daily with breakfast. 11/21/16   Cliffton AstersJohn Campbell, MD  montelukast (SINGULAIR) 10 MG tablet Take 10 mg by mouth at bedtime.    Historical Provider, MD  Multiple Vitamin (MULTIVITAMIN) tablet Take 1 tablet by mouth daily.    Historical Provider, MD  zinc gluconate 50 MG tablet Take 50 mg by mouth daily.    Historical Provider, MD    Family History Family History  Problem Relation Age of Onset  . Diabetes Mother   . Heart disease Mother   . Hypertension Mother   . Diabetes Father   . Heart disease Father   . Hyperlipidemia Father     Social History Social History  Substance Use Topics  . Smoking status: Current Every Day Smoker    Packs/day: 1.00    Types: Cigarettes    Last attempt to quit: 10/23/2005  . Smokeless tobacco: Never Used  . Alcohol use No     Allergies   Iodine; Latex; and Sulfa antibiotics   Review of Systems Review of Systems  Constitutional: Negative for appetite change and fatigue.  HENT: Negative for congestion, ear discharge and sinus pressure.   Eyes: Negative for discharge.  Respiratory: Negative for cough.   Cardiovascular: Negative for chest pain.  Gastrointestinal: Negative for abdominal pain and diarrhea.  Genitourinary: Negative for frequency and hematuria.  Musculoskeletal: Negative for back pain.  Skin: Negative for rash.  Neurological: Negative for seizures and headaches.  Psychiatric/Behavioral: Negative for hallucinations, self-injury and suicidal ideas. The patient is  nervous/anxious.   All other systems reviewed and are negative.    Physical Exam Updated Vital Signs BP (!) 140/92 (BP Location: Right Arm)   Pulse 95   Temp 98.2 F (36.8 C) (Oral)   Resp 18   Ht 5\' 7"  (1.702 m)   Wt 179 lb (81.2 kg)   SpO2 96%   BMI 28.04 kg/m   Physical Exam  Constitutional: He is oriented to person, place, and time. He appears well-developed.  HENT:  Head: Normocephalic.  Eyes: Conjunctivae and EOM are normal. No scleral icterus.  Neck: Neck supple. No thyromegaly present.  Cardiovascular: Normal rate and regular rhythm.  Exam reveals no gallop and no friction rub.   No murmur heard. Pulmonary/Chest: No stridor. He has no wheezes. He has no rales. He exhibits no tenderness.  Abdominal: He exhibits no distension. There is no tenderness. There is no rebound.  Musculoskeletal: Normal range of motion. He exhibits no edema.  Lymphadenopathy:    He has no cervical adenopathy.  Neurological: He is oriented to person, place, and time. He exhibits normal muscle tone. Coordination normal.  Skin: No rash noted. No erythema.  Psychiatric: His behavior is normal. His mood appears anxious. He expresses no homicidal and no suicidal ideation.  He is severely anxious.     ED Treatments / Results  DIAGNOSTIC STUDIES: Oxygen Saturation is 96% on RA, adequate by my interpretation.    COORDINATION OF CARE: 11:57 AM Discussed treatment plan with pt at bedside and pt agreed to plan. I will consult psychiatry for the patient.  Labs (all labs ordered are listed, but only abnormal results are displayed) Labs Reviewed  CBC WITH DIFFERENTIAL/PLATELET  COMPREHENSIVE METABOLIC PANEL  ETHANOL  RAPID URINE DRUG SCREEN, HOSP PERFORMED    EKG  EKG Interpretation None       Radiology No results found.  Procedures Procedures (including critical care time)  Medications Ordered in ED Medications  LORazepam (ATIVAN) tablet 1 mg (not administered)     Initial  Impression / Assessment and Plan / ED Course  I have reviewed the triage vital signs and the nursing notes.  Pertinent labs & imaging results that were available during my care of the patient were reviewed by me and considered in my medical decision making (see chart for details).   pt with severe anxiety from ptsd,  tts consult pending    Final Clinical Impressions(s) / ED Diagnoses   Final diagnoses:  None    New Prescriptions New Prescriptions   No medications on file      Bethann Berkshire, MD 01/10/17 1243

## 2017-01-09 NOTE — ED Notes (Signed)
Pt c/o worsening PTSD. Pt reports increasing paranoia and night terrors. Pt also reports increasing tension and arguments with girlfriend (with whom he lives). Pt denies SI/HI. But states he is fearful of how he will act during a night terror or if he has a flashback.
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
## 2017-01-09 NOTE — ED Triage Notes (Signed)
Patient states that he suffers from PTSD from his time in the [employer]. Patient states he has been having night terrors since his girl friend has been living with him. Patient denies intent to hurt himself or anyone else. Patient states that he wants help and feels threatened at home and is afraid what will happen if he continues to have night terrors.

## 2017-01-09 NOTE — ED Notes (Signed)
telepsy complete 

## 2017-01-09 NOTE — ED Provider Notes (Signed)
Behavioral Health reports no homicidal or suicidal ideation. They feel the patient can follow-up with the community mental health resources.   Jason Sheppard [name], MD 01/09/17 [phone]

## 2017-01-09 NOTE — Discharge Instructions (Signed)
Recommend follow-up with community counseling center.  Phone number given.

## 2017-01-09 NOTE — ED Notes (Signed)
Patient given discharge instruction, verbalized understand. Patient ambulatory out of the department.  

## 2017-05-09 ENCOUNTER — Other Ambulatory Visit: Payer: Managed Care, Other (non HMO)

## 2017-05-09 DIAGNOSIS — B20 Human immunodeficiency virus [HIV] disease: Secondary | ICD-10-CM

## 2017-05-09 LAB — COMPREHENSIVE METABOLIC PANEL
ALBUMIN: 4.9 g/dL (ref 3.6–5.1)
ALK PHOS: 61 U/L (ref 40–115)
ALT: 27 U/L (ref 9–46)
AST: 21 U/L (ref 10–40)
BILIRUBIN TOTAL: 0.5 mg/dL (ref 0.2–1.2)
BUN: 14 mg/dL (ref 7–25)
CO2: 28 mmol/L (ref 20–31)
CREATININE: 1.09 mg/dL (ref 0.60–1.35)
Calcium: 9.8 mg/dL (ref 8.6–10.3)
Chloride: 100 mmol/L (ref 98–110)
Glucose, Bld: 90 mg/dL (ref 65–99)
Potassium: 4.4 mmol/L (ref 3.5–5.3)
SODIUM: 139 mmol/L (ref 135–146)
TOTAL PROTEIN: 7.5 g/dL (ref 6.1–8.1)

## 2017-05-09 LAB — LIPID PANEL
CHOLESTEROL: 202 mg/dL — AB (ref <200)
HDL: 31 mg/dL — ABNORMAL LOW (ref >40)
LDL Cholesterol: 95 mg/dL (ref <100)
TRIGLYCERIDES: 380 mg/dL — AB (ref <150)
Total CHOL/HDL Ratio: 6.5 Ratio — ABNORMAL HIGH (ref <5.0)
VLDL: 76 mg/dL — ABNORMAL HIGH (ref <30)

## 2017-05-09 LAB — CBC
HEMATOCRIT: 46.2 % (ref 38.5–50.0)
HEMOGLOBIN: 16.3 g/dL (ref 13.2–17.1)
MCH: 31.7 pg (ref 27.0–33.0)
MCHC: 35.3 g/dL (ref 32.0–36.0)
MCV: 89.9 fL (ref 80.0–100.0)
MPV: 9.9 fL (ref 7.5–12.5)
Platelets: 290 10*3/uL (ref 140–400)
RBC: 5.14 MIL/uL (ref 4.20–5.80)
RDW: 12.9 % (ref 11.0–15.0)
WBC: 7.6 10*3/uL (ref 3.8–10.8)

## 2017-05-10 LAB — RPR

## 2017-05-11 LAB — HIV-1 RNA QUANT-NO REFLEX-BLD
HIV 1 RNA Quant: <20 copies/mL
HIV-1 RNA QUANT, LOG: NOT DETECTED {Log_copies}/mL

## 2017-05-11 LAB — T-HELPER CELL (CD4) - (RCID CLINIC ONLY)
CD4 % Helper T Cell: 35 % (ref 33–55)
CD4 T CELL ABS: 930 /uL (ref 400–2700)

## 2017-05-23 ENCOUNTER — Ambulatory Visit (INDEPENDENT_AMBULATORY_CARE_PROVIDER_SITE_OTHER): Payer: Managed Care, Other (non HMO) | Admitting: Internal Medicine

## 2017-05-23 ENCOUNTER — Encounter: Payer: Self-pay | Admitting: Internal Medicine

## 2017-05-23 ENCOUNTER — Ambulatory Visit (INDEPENDENT_AMBULATORY_CARE_PROVIDER_SITE_OTHER): Payer: Managed Care, Other (non HMO) | Admitting: Licensed Clinical Social Worker

## 2017-05-23 DIAGNOSIS — E785 Hyperlipidemia, unspecified: Secondary | ICD-10-CM | POA: Diagnosis not present

## 2017-05-23 DIAGNOSIS — B20 Human immunodeficiency virus [HIV] disease: Secondary | ICD-10-CM | POA: Diagnosis not present

## 2017-05-23 DIAGNOSIS — F4310 Post-traumatic stress disorder, unspecified: Secondary | ICD-10-CM | POA: Diagnosis not present

## 2017-05-23 MED ORDER — MONTELUKAST SODIUM 10 MG PO TABS: 10.0000 mg | ORAL_TABLET | Freq: Every day | ORAL | 0 refills | Status: DC

## 2017-05-23 NOTE — Assessment & Plan Note (Signed)
Fortunately, his infection remains under excellent long-term control. I had a very frank discussion with him with Jason MeadJeri present throughout the legal ramifications of not disclosing HIV status to a sexual partner. Jason Sheppard will be tested here today. They have been using condoms since she learned of his infection. Jason Sheppard will continue Genvoya and follow-up after lab work in 6 months.

## 2017-05-23 NOTE — Assessment & Plan Note (Signed)
He has not seen his primary care physician in over a year. I talked to him about the increased risk for metabolic syndrome and cancer even when his HIV is suppressed. Encouraged him to get back to see Dr. Duanne Guessewey soon.

## 2017-05-23 NOTE — Assessment & Plan Note (Signed)
I had him meet with our counselor, Vergia AlbertsSherry Royster, to talk about potential resources available through the [facility].
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
## 2017-05-29 ENCOUNTER — Other Ambulatory Visit: Payer: Self-pay | Admitting: Internal Medicine

## 2017-05-29 DIAGNOSIS — B20 Human immunodeficiency virus [HIV] disease: Secondary | ICD-10-CM

## 2017-09-19 ENCOUNTER — Telehealth: Payer: Self-pay | Admitting: *Deleted

## 2017-09-19 NOTE — Telephone Encounter (Signed)
Received call from Marisa SprinklesPat Hilliard, [facility], requesting chart to be faxed to [phone] before his appointment in December.  RN placed signed request for [employer] scanning, faxed last office note/immunization record/last labs/intake labs as requested. Andree CossHowell, Adasyn Mcadams M, RN

## 2017-09-27 ENCOUNTER — Other Ambulatory Visit: Payer: Self-pay | Admitting: Internal Medicine

## 2017-09-27 DIAGNOSIS — B20 Human immunodeficiency virus [HIV] disease: Secondary | ICD-10-CM

## 2019-02-24 ENCOUNTER — Encounter (HOSPITAL_COMMUNITY): Payer: Self-pay

## 2019-02-24 ENCOUNTER — Other Ambulatory Visit: Payer: Self-pay

## 2019-02-24 ENCOUNTER — Inpatient Hospital Stay (HOSPITAL_COMMUNITY)
Admission: EM | Admit: 2019-02-24 | Discharge: 2019-02-27 | DRG: 975 | Disposition: A | Discharge status: Home / Self Care | Payer: No Typology Code available for payment source | Attending: Internal Medicine | Admitting: Internal Medicine

## 2019-02-24 ENCOUNTER — Emergency Department (HOSPITAL_COMMUNITY): Payer: No Typology Code available for payment source

## 2019-02-24 DIAGNOSIS — Z20828 Contact with and (suspected) exposure to other viral communicable diseases: Secondary | ICD-10-CM | POA: Diagnosis present

## 2019-02-24 DIAGNOSIS — Z91040 Latex allergy status: Secondary | ICD-10-CM

## 2019-02-24 DIAGNOSIS — F17200 Nicotine dependence, unspecified, uncomplicated: Secondary | ICD-10-CM | POA: Diagnosis present

## 2019-02-24 DIAGNOSIS — Z882 Allergy status to sulfonamides status: Secondary | ICD-10-CM

## 2019-02-24 DIAGNOSIS — F4310 Post-traumatic stress disorder, unspecified: Secondary | ICD-10-CM | POA: Diagnosis present

## 2019-02-24 DIAGNOSIS — Z91041 Radiographic dye allergy status: Secondary | ICD-10-CM

## 2019-02-24 DIAGNOSIS — R079 Chest pain, unspecified: Secondary | ICD-10-CM | POA: Diagnosis present

## 2019-02-24 DIAGNOSIS — G4733 Obstructive sleep apnea (adult) (pediatric): Secondary | ICD-10-CM | POA: Diagnosis present

## 2019-02-24 DIAGNOSIS — R0789 Other chest pain: Secondary | ICD-10-CM

## 2019-02-24 DIAGNOSIS — B20 Human immunodeficiency virus [HIV] disease: Secondary | ICD-10-CM | POA: Diagnosis present

## 2019-02-24 DIAGNOSIS — G43909 Migraine, unspecified, not intractable, without status migrainosus: Secondary | ICD-10-CM | POA: Diagnosis present

## 2019-02-24 DIAGNOSIS — F329 Major depressive disorder, single episode, unspecified: Secondary | ICD-10-CM | POA: Diagnosis present

## 2019-02-24 DIAGNOSIS — I959 Hypotension, unspecified: Secondary | ICD-10-CM | POA: Diagnosis not present

## 2019-02-24 DIAGNOSIS — E781 Pure hyperglyceridemia: Secondary | ICD-10-CM | POA: Diagnosis present

## 2019-02-24 DIAGNOSIS — R0902 Hypoxemia: Secondary | ICD-10-CM | POA: Diagnosis present

## 2019-02-24 DIAGNOSIS — R7989 Other specified abnormal findings of blood chemistry: Secondary | ICD-10-CM | POA: Diagnosis present

## 2019-02-24 DIAGNOSIS — Z8349 Family history of other endocrine, nutritional and metabolic diseases: Secondary | ICD-10-CM

## 2019-02-24 DIAGNOSIS — J181 Lobar pneumonia, unspecified organism: Secondary | ICD-10-CM | POA: Diagnosis not present

## 2019-02-24 DIAGNOSIS — I319 Disease of pericardium, unspecified: Secondary | ICD-10-CM | POA: Diagnosis present

## 2019-02-24 DIAGNOSIS — J189 Pneumonia, unspecified organism: Secondary | ICD-10-CM | POA: Diagnosis present

## 2019-02-24 DIAGNOSIS — R778 Other specified abnormalities of plasma proteins: Secondary | ICD-10-CM

## 2019-02-24 DIAGNOSIS — Z833 Family history of diabetes mellitus: Secondary | ICD-10-CM

## 2019-02-24 DIAGNOSIS — Z8249 Family history of ischemic heart disease and other diseases of the circulatory system: Secondary | ICD-10-CM

## 2019-02-24 DIAGNOSIS — Z79899 Other long term (current) drug therapy: Secondary | ICD-10-CM

## 2019-02-24 LAB — BASIC METABOLIC PANEL
Anion gap: 11 (ref 5–15)
BUN: 13 mg/dL (ref 6–20)
CO2: 24 mmol/L (ref 22–32)
Calcium: 8.8 mg/dL — ABNORMAL LOW (ref 8.9–10.3)
Chloride: 100 mmol/L (ref 98–111)
Creatinine, Ser: 1.13 mg/dL (ref 0.61–1.24)
GFR calc Af Amer: >60 mL/min (ref >60)
GFR calc non Af Amer: >60 mL/min (ref >60)
Glucose, Bld: 148 mg/dL — ABNORMAL HIGH (ref 70–99)
Potassium: 3.4 mmol/L — ABNORMAL LOW (ref 3.5–5.1)
Sodium: 135 mmol/L (ref 135–145)

## 2019-02-24 LAB — CBC WITH DIFFERENTIAL/PLATELET
Abs Immature Granulocytes: 0.06 10*3/uL (ref 0.00–0.07)
Basophils Absolute: 0 10*3/uL (ref 0.0–0.1)
Basophils Relative: 0 %
Eosinophils Absolute: 0.1 10*3/uL (ref 0.0–0.5)
Eosinophils Relative: 1 %
HCT: 41.7 % (ref 39.0–52.0)
Hemoglobin: 14.8 g/dL (ref 13.0–17.0)
Immature Granulocytes: 1 %
Lymphocytes Relative: 22 %
Lymphs Abs: 2 10*3/uL (ref 0.7–4.0)
MCH: 30.8 pg (ref 26.0–34.0)
MCHC: 35.5 g/dL (ref 30.0–36.0)
MCV: 86.7 fL (ref 80.0–100.0)
Monocytes Absolute: 1.1 10*3/uL — ABNORMAL HIGH (ref 0.1–1.0)
Monocytes Relative: 12 %
Neutro Abs: 6 10*3/uL (ref 1.7–7.7)
Neutrophils Relative %: 64 %
Platelets: 248 10*3/uL (ref 150–400)
RBC: 4.81 MIL/uL (ref 4.22–5.81)
RDW: 12.4 % (ref 11.5–15.5)
WBC: 9.4 10*3/uL (ref 4.0–10.5)
nRBC: 0 % (ref 0.0–0.2)

## 2019-02-24 LAB — TROPONIN I: Troponin I: 0.05 ng/mL (ref <0.03)

## 2019-02-24 MED ORDER — ACETAMINOPHEN 500 MG PO TABS
1000.0000 mg | ORAL_TABLET | Freq: Once | ORAL | Status: AC
  Administered 2019-02-24: 23:00:00 1000 mg via ORAL
  Filled 2019-02-24: qty 2

## 2019-02-24 MED ORDER — AMOXICILLIN-POT CLAVULANATE 875-125 MG PO TABS
1.0000 | ORAL_TABLET | Freq: Once | ORAL | Status: AC
  Administered 2019-02-24: 23:00:00 1 via ORAL
  Filled 2019-02-24: qty 1

## 2019-02-24 MED ORDER — AZITHROMYCIN 250 MG PO TABS
500.0000 mg | ORAL_TABLET | Freq: Once | ORAL | Status: AC
  Administered 2019-02-24: 23:00:00 500 mg via ORAL
  Filled 2019-02-24: qty 2

## 2019-02-24 NOTE — ED Provider Notes (Signed)
[facility] EMERGENCY DEPARTMENT Provider Note   CSN: [id number] Arrival date & time: 02/24/19  2048    History   Chief Complaint Chief Complaint  Patient presents with  . Shortness of Breath    HPI Jason Sheppard is a 38 y.o. male with a past medical history significant for HIV with undetectable virus load, PTSD, migraine and allergies presenting with a one-week history of cough which has become more frequent and is now associated with shortness of breath and midsternal chest pressure sensation since yesterday afternoon.  He also endorses fever with T-max of 101.  His cough has occasionally been productive of a white sputum.  He is a smoker.  He also reports potential COVID exposures.  There are 3 coworkers out with COVID, one with potential direct contact with him.  He denies nausea or vomiting, but has had some mild diarrhea since yesterday.  He reports generalized headache.  Denies sore throat, chest pain or abdominal pain.  He has had no medications or treatments prior to arrival.     The history is provided by the patient.    Past Medical History:  Diagnosis Date  . Allergy   . Attention deficit disorder with hyperactivity   . Depression   . HIV (human immunodeficiency virus infection) (HCC)   . Migraine   . OSA (obstructive sleep apnea)   . Plantar fascial fibromatosis   . PTSD (post-traumatic stress disorder)     Patient Active Problem List   Diagnosis Date Noted  . Unintentional weight loss 06/06/2016  . HIV disease (HCC) 12/15/2015  . Dyslipidemia 12/15/2015  . Hyperglycemia 12/15/2015  . Sleep apnea 12/15/2015  . Former smoker 12/15/2015  . Attention deficit disorder with hyperactivity 12/15/2015  . PTSD (post-traumatic stress disorder) 12/15/2015    Past Surgical History:  Procedure Laterality Date  . HAND SURGERY    . shrapnel removal          Home Medications    Prior to Admission medications   Medication Sig Start Date End Date Taking?  Authorizing Provider  bictegravir-emtricitabine-tenofovir AF (BIKTARVY) 50-200-25 MG TABS tablet Take 1 tablet by mouth daily.   Yes [provider]  citalopram (CELEXA) 10 MG tablet Take 10 mg by mouth daily.   Yes [provider]  montelukast (SINGULAIR) 10 MG tablet Take 1 tablet (10 mg total) by mouth at bedtime. Patient taking differently: Take 10 mg by mouth every morning.  05/23/17  Yes Cliffton Asters, MD  prazosin (MINIPRESS) 2 MG capsule Take 2 mg by mouth at bedtime.   Yes [provider]    Family History Family History  Problem Relation Age of Onset  . Diabetes Mother   . Heart disease Mother   . Hypertension Mother   . Diabetes Father   . Heart disease Father   . Hyperlipidemia Father     Social History Social History   Tobacco Use  . Smoking status: Current Every Day Smoker    Packs/day: 1.00    Types: Cigarettes    Last attempt to quit: 10/23/2005    Years since quitting: 13.3  . Smokeless tobacco: Never Used  Substance Use Topics  . Alcohol use: No    Alcohol/week: 0.0 standard drinks  . Drug use: No     Allergies   Sulfa antibiotics; Iodine; and Latex   Review of Systems Review of Systems  Constitutional: Positive for fatigue and fever.  HENT: Negative for congestion and sore throat.   Eyes: Negative.  Respiratory: Positive for cough and shortness of breath. Negative for chest tightness.   Cardiovascular: Negative for chest pain, palpitations and leg swelling.  Gastrointestinal: Positive for diarrhea. Negative for abdominal pain, nausea and vomiting.  Genitourinary: Negative.   Musculoskeletal: Negative for arthralgias, joint swelling and neck pain.  Skin: Negative.  Negative for rash and wound.  Neurological: Negative for dizziness, weakness, light-headedness, numbness and headaches.  Psychiatric/Behavioral: Negative.      Physical Exam Updated Vital Signs BP 134/89   Pulse 77   Temp 98.7 F (37.1 C) (Oral)   Resp  18   Ht 5\' 9"  (1.753 m)   Wt 89.4 kg   SpO2 94%   BMI 29.09 kg/m   Physical Exam Vitals signs and nursing note reviewed.  Constitutional:      Appearance: He is well-developed.  HENT:     Head: Normocephalic and atraumatic.  Eyes:     Conjunctiva/sclera: Conjunctivae normal.  Neck:     Musculoskeletal: Normal range of motion.  Cardiovascular:     Rate and Rhythm: Normal rate and regular rhythm.     Heart sounds: Normal heart sounds.  Pulmonary:     Effort: Pulmonary effort is normal.     Breath sounds: Decreased breath sounds present. No wheezing, rhonchi or rales.  Chest:     Chest wall: No tenderness.  Abdominal:     General: Bowel sounds are normal.     Palpations: Abdomen is soft.     Tenderness: There is no abdominal tenderness.  Musculoskeletal: Normal range of motion.  Skin:    General: Skin is warm and dry.  Neurological:     Mental Status: He is alert.      ED Treatments / Results  Labs (all labs ordered are listed, but only abnormal results are displayed) Labs Reviewed  BASIC METABOLIC PANEL - Abnormal; Notable for the following components:      Result Value   Potassium 3.4 (*)    Glucose, Bld 148 (*)    Calcium 8.8 (*)    All other components within normal limits  CBC WITH DIFFERENTIAL/PLATELET - Abnormal; Notable for the following components:   Monocytes Absolute 1.1 (*)    All other components within normal limits  TROPONIN I - Abnormal; Notable for the following components:   Troponin I 0.05 (*)    All other components within normal limits  TROPONIN I - Abnormal; Notable for the following components:   Troponin I 0.07 (*)    All other components within normal limits  NOVEL CORONAVIRUS, NAA (HOSPITAL ORDER, SEND-OUT TO REF LAB)  SARS CORONAVIRUS 2 (HOSPITAL ORDER, [facility])    EKG EKG Interpretation  Date/Time:  Monday Feb 24 2019 21:07:23 EDT Ventricular Rate:  91 PR Interval:    QRS Duration: 107 QT  Interval:  352 QTC Calculation: 433 R Axis:   95 Text Interpretation:  Sinus rhythm Consider right ventricular hypertrophy No old tracing to compare Confirmed by Eber HongMiller, Brian ([id number]) on 02/24/2019 10:20:15 PM   Radiology Dg Chest Port 1 View  Result Date: 02/24/2019 CLINICAL DATA:  Shortness of breath and cough for 1 week, fevers EXAM: PORTABLE CHEST 1 VIEW COMPARISON:  None. FINDINGS: Cardiac shadows within normal limits. The lungs are well aerated bilaterally. Patchy infiltrate is noted in the left lung base. No sizable effusion is seen. No bony abnormality is noted. IMPRESSION: Patchy left basilar infiltrate. Electronically Signed   By: Alcide CleverMark  Lukens M.D.   On: 02/24/2019 22:21  Procedures Procedures (including critical care time)  Medications Ordered in ED Medications  acetaminophen (TYLENOL) tablet 1,000 mg (1,000 mg Oral Given 02/24/19 2246)  amoxicillin-clavulanate (AUGMENTIN) 875-125 MG per tablet 1 tablet (1 tablet Oral Given 02/24/19 2303)  azithromycin (ZITHROMAX) tablet 500 mg (500 mg Oral Given 02/24/19 2303)     Initial Impression / Assessment and Plan / ED Course  I have reviewed the triage vital signs and the nursing notes.  Pertinent labs & imaging results that were available during my care of the patient were reviewed by me and considered in my medical decision making (see chart for details).        Pt was also seen by Dr Hyacinth Meeker during this ed visit. Pt with left lower lobe pneumonia in setting of possible covid exposure. 24 hour covid testing performed.  CXR does not have classic covid findings as this being a lobar pneumonia.  He was started on augmentin and zithromax here.  First troponin elevated at 0.05. Second at two hour repeated to assess for potential myocarditis - this is more elevated at 0.07.  Consult with cardiology Dr. Meredeth Ide with [facility], troponins are low, but there have been cases of myocarditis associated with Covid, recommends echocardiogram to rule out  myocarditis.  Discussed with pt who is agreeable to stay.  Initially 24 hour covid collected when pt was planning to be dispo'd home.  Will need rapid test which was ordered.  Discussed with Dr. Sharl Ma who will admit pt once the rapid test is resulted.     Jason Sheppard was evaluated in Emergency Department on 02/25/2019 for the symptoms described in the history of present illness. He was evaluated in the context of the global COVID-19 pandemic, which necessitated consideration that the patient might be at risk for infection with the SARS-CoV-2 virus that causes COVID-19. Institutional protocols and algorithms that pertain to the evaluation of patients at risk for COVID-19 are in a state of rapid change based on information released by regulatory bodies including the CDC and federal and state organizations. These policies and algorithms were followed during the patient's care in the ED.   Final Clinical Impressions(s) / ED Diagnoses   Final diagnoses:  Community acquired pneumonia of left lower lobe of lung (HCC)  Chest pressure  Elevated troponin level not due to acute coronary syndrome    ED Discharge Orders    None       Victoriano Lain 02/25/19 Jerrel Ivory, MD 03/03/19 [phone]

## 2019-02-24 NOTE — ED Triage Notes (Signed)
Pt presents to ED with complaints of SOB that started yesterday. Pt states he has had a cough x 1 week. Pt states multiple cases of Covid at his current employer.

## 2019-02-24 NOTE — ED Notes (Signed)
CRITICAL VALUE ALERT  Critical Value: Troponin 0.05 Date & Time Notied:  02/24/19 @ 2218 Provider Notified: Dr Hyacinth Meeker Orders Received/Actions taken: None yet

## 2019-02-24 NOTE — ED Provider Notes (Signed)
Pt presents with c/o sob, mild cp and fever with cough - on exam has subtle rales at the L base - has normal VS, speaks in full sentences, no coughign in room, no hypoxia, and normal metabolic panel, no leukocytosis, and though he has had some coronavirus exposure.  We will treat for possible community-acquired pneumonia but send off outpatient coronavirus test.  Patient agreeable, stable for discharge, doubt that the troponin at 0.05 is significant at all.  EKG unremarkable.  R/o myocarditis with second trop - doubt ischemia Antibiotics given for possibel bacterial pna   EKG Interpretation  Date/Time:  Monday Feb 24 2019 21:07:23 EDT Ventricular Rate:  91 PR Interval:    QRS Duration: 107 QT Interval:  352 QTC Calculation: 433 R Axis:   95 Text Interpretation:  Sinus rhythm Consider right ventricular hypertrophy No old tracing to compare Confirmed by Eber Hong ([id number]) on 02/24/2019 10:20:15 PM       Medical screening examination/treatment/procedure(s) were conducted as a shared visit with non-physician practitioner(s) and myself.  I personally evaluated the patient during the encounter.  Clinical Impression:   Final diagnoses:  Community acquired pneumonia of left lower lobe of lung (HCC)  Chest pressure  Elevated troponin level not due to acute coronary syndrome         Eber Hong, MD 03/03/19 [phone]

## 2019-02-25 ENCOUNTER — Inpatient Hospital Stay (HOSPITAL_COMMUNITY): Payer: No Typology Code available for payment source

## 2019-02-25 DIAGNOSIS — F17200 Nicotine dependence, unspecified, uncomplicated: Secondary | ICD-10-CM | POA: Diagnosis present

## 2019-02-25 DIAGNOSIS — J181 Lobar pneumonia, unspecified organism: Principal | ICD-10-CM

## 2019-02-25 DIAGNOSIS — Z833 Family history of diabetes mellitus: Secondary | ICD-10-CM | POA: Diagnosis not present

## 2019-02-25 DIAGNOSIS — B20 Human immunodeficiency virus [HIV] disease: Secondary | ICD-10-CM | POA: Diagnosis present

## 2019-02-25 DIAGNOSIS — R7989 Other specified abnormal findings of blood chemistry: Secondary | ICD-10-CM

## 2019-02-25 DIAGNOSIS — Z8249 Family history of ischemic heart disease and other diseases of the circulatory system: Secondary | ICD-10-CM | POA: Diagnosis not present

## 2019-02-25 DIAGNOSIS — Z882 Allergy status to sulfonamides status: Secondary | ICD-10-CM | POA: Diagnosis not present

## 2019-02-25 DIAGNOSIS — G4733 Obstructive sleep apnea (adult) (pediatric): Secondary | ICD-10-CM | POA: Diagnosis present

## 2019-02-25 DIAGNOSIS — F4310 Post-traumatic stress disorder, unspecified: Secondary | ICD-10-CM | POA: Diagnosis present

## 2019-02-25 DIAGNOSIS — R0789 Other chest pain: Secondary | ICD-10-CM | POA: Diagnosis not present

## 2019-02-25 DIAGNOSIS — G43909 Migraine, unspecified, not intractable, without status migrainosus: Secondary | ICD-10-CM | POA: Diagnosis present

## 2019-02-25 DIAGNOSIS — I959 Hypotension, unspecified: Secondary | ICD-10-CM | POA: Diagnosis not present

## 2019-02-25 DIAGNOSIS — F329 Major depressive disorder, single episode, unspecified: Secondary | ICD-10-CM | POA: Diagnosis present

## 2019-02-25 DIAGNOSIS — E781 Pure hyperglyceridemia: Secondary | ICD-10-CM | POA: Diagnosis present

## 2019-02-25 DIAGNOSIS — I300 Acute nonspecific idiopathic pericarditis: Secondary | ICD-10-CM | POA: Diagnosis not present

## 2019-02-25 DIAGNOSIS — R079 Chest pain, unspecified: Secondary | ICD-10-CM | POA: Diagnosis present

## 2019-02-25 DIAGNOSIS — Z91040 Latex allergy status: Secondary | ICD-10-CM | POA: Diagnosis not present

## 2019-02-25 DIAGNOSIS — Z20828 Contact with and (suspected) exposure to other viral communicable diseases: Secondary | ICD-10-CM | POA: Diagnosis present

## 2019-02-25 DIAGNOSIS — Z79899 Other long term (current) drug therapy: Secondary | ICD-10-CM | POA: Diagnosis not present

## 2019-02-25 DIAGNOSIS — Z91041 Radiographic dye allergy status: Secondary | ICD-10-CM | POA: Diagnosis not present

## 2019-02-25 DIAGNOSIS — Z8349 Family history of other endocrine, nutritional and metabolic diseases: Secondary | ICD-10-CM | POA: Diagnosis not present

## 2019-02-25 DIAGNOSIS — I319 Disease of pericardium, unspecified: Secondary | ICD-10-CM | POA: Diagnosis present

## 2019-02-25 DIAGNOSIS — J189 Pneumonia, unspecified organism: Secondary | ICD-10-CM | POA: Diagnosis present

## 2019-02-25 DIAGNOSIS — R0902 Hypoxemia: Secondary | ICD-10-CM | POA: Diagnosis present

## 2019-02-25 LAB — COMPREHENSIVE METABOLIC PANEL
ALT: 51 U/L — ABNORMAL HIGH (ref 0–44)
AST: 23 U/L (ref 15–41)
Albumin: 4.3 g/dL (ref 3.5–5.0)
Alkaline Phosphatase: 57 U/L (ref 38–126)
Anion gap: 9 (ref 5–15)
BUN: 12 mg/dL (ref 6–20)
CO2: 28 mmol/L (ref 22–32)
Calcium: 9 mg/dL (ref 8.9–10.3)
Chloride: 100 mmol/L (ref 98–111)
Creatinine, Ser: 1.02 mg/dL (ref 0.61–1.24)
GFR calc Af Amer: >60 mL/min (ref >60)
GFR calc non Af Amer: >60 mL/min (ref >60)
Glucose, Bld: 131 mg/dL — ABNORMAL HIGH (ref 70–99)
Potassium: 4 mmol/L (ref 3.5–5.1)
Sodium: 137 mmol/L (ref 135–145)
Total Bilirubin: 1.1 mg/dL (ref 0.3–1.2)
Total Protein: 7.8 g/dL (ref 6.5–8.1)

## 2019-02-25 LAB — URINALYSIS, ROUTINE W REFLEX MICROSCOPIC
Bilirubin Urine: NEGATIVE
Glucose, UA: NEGATIVE mg/dL
Hgb urine dipstick: NEGATIVE
Ketones, ur: NEGATIVE mg/dL
Leukocytes,Ua: NEGATIVE
Nitrite: NEGATIVE
Protein, ur: NEGATIVE mg/dL
Specific Gravity, Urine: 1.011 (ref 1.005–1.030)
pH: 6 (ref 5.0–8.0)

## 2019-02-25 LAB — TROPONIN I
Troponin I: 0.07 ng/mL (ref <0.03)
Troponin I: 0.08 ng/mL (ref <0.03)
Troponin I: 0.08 ng/mL (ref <0.03)
Troponin I: 0.57 ng/mL (ref <0.03)

## 2019-02-25 LAB — HEMOGLOBIN A1C
Hgb A1c MFr Bld: 5.7 % — ABNORMAL HIGH (ref 4.8–5.6)
Mean Plasma Glucose: 116.89 mg/dL

## 2019-02-25 LAB — CBC
HCT: 44.8 % (ref 39.0–52.0)
Hemoglobin: 15.4 g/dL (ref 13.0–17.0)
MCH: 30.2 pg (ref 26.0–34.0)
MCHC: 34.4 g/dL (ref 30.0–36.0)
MCV: 87.8 fL (ref 80.0–100.0)
Platelets: 241 10*3/uL (ref 150–400)
RBC: 5.1 MIL/uL (ref 4.22–5.81)
RDW: 12.2 % (ref 11.5–15.5)
WBC: 8.9 10*3/uL (ref 4.0–10.5)
nRBC: 0 % (ref 0.0–0.2)

## 2019-02-25 LAB — T-HELPER CELLS (CD4) COUNT (NOT AT ARMC)
CD4 % Helper T Cell: 42 % (ref 33–65)
CD4 T Cell Abs: 1097 /uL (ref 400–1790)

## 2019-02-25 LAB — HEPARIN LEVEL (UNFRACTIONATED): Heparin Unfractionated: <0.1 IU/mL — ABNORMAL LOW (ref 0.30–0.70)

## 2019-02-25 LAB — TSH: TSH: 1.922 u[IU]/mL (ref 0.350–4.500)

## 2019-02-25 LAB — SARS CORONAVIRUS 2 BY RT PCR (HOSPITAL ORDER, PERFORMED IN ~~LOC~~ HOSPITAL LAB): SARS Coronavirus 2: NEGATIVE

## 2019-02-25 LAB — STREP PNEUMONIAE URINARY ANTIGEN: Strep Pneumo Urinary Antigen: NEGATIVE

## 2019-02-25 LAB — APTT: aPTT: 40 seconds — ABNORMAL HIGH (ref 24–36)

## 2019-02-25 MED ORDER — SODIUM CHLORIDE 0.9 % IV SOLN
INTRAVENOUS | Status: DC
  Administered 2019-02-25 – 2019-02-26 (×3): via INTRAVENOUS

## 2019-02-25 MED ORDER — ACETAMINOPHEN 325 MG PO TABS
650.0000 mg | ORAL_TABLET | Freq: Four times a day (QID) | ORAL | Status: DC | PRN
  Administered 2019-02-25: 650 mg via ORAL
  Filled 2019-02-25: qty 2

## 2019-02-25 MED ORDER — ACETAMINOPHEN 650 MG RE SUPP: 650.0000 mg | Freq: Four times a day (QID) | RECTAL | Status: DC | PRN

## 2019-02-25 MED ORDER — SODIUM CHLORIDE 0.9 % IV SOLN
500.0000 mg | INTRAVENOUS | Status: DC
  Administered 2019-02-25 – 2019-02-26 (×2): 500 mg via INTRAVENOUS
  Filled 2019-02-25 (×3): qty 500

## 2019-02-25 MED ORDER — SODIUM CHLORIDE 0.9 % IV BOLUS
250.0000 mL | Freq: Once | INTRAVENOUS | Status: AC
  Administered 2019-02-25: 250 mL via INTRAVENOUS

## 2019-02-25 MED ORDER — SODIUM CHLORIDE 0.9 % IV SOLN
1.0000 g | INTRAVENOUS | Status: DC
  Administered 2019-02-25 – 2019-02-27 (×3): 1 g via INTRAVENOUS
  Filled 2019-02-25 (×3): qty 10

## 2019-02-25 MED ORDER — ONDANSETRON HCL 4 MG PO TABS: 4.0000 mg | ORAL_TABLET | Freq: Four times a day (QID) | ORAL | Status: DC | PRN

## 2019-02-25 MED ORDER — POTASSIUM CHLORIDE CRYS ER 20 MEQ PO TBCR: 20.0000 meq | EXTENDED_RELEASE_TABLET | Freq: Once | ORAL | Status: DC

## 2019-02-25 MED ORDER — HEPARIN (PORCINE) 25000 UT/250ML-% IV SOLN
1000.0000 [IU]/h | INTRAVENOUS | Status: DC
  Administered 2019-02-25 – 2019-02-26 (×2): 1000 [IU]/h via INTRAVENOUS
  Filled 2019-02-25 (×2): qty 250

## 2019-02-25 MED ORDER — CITALOPRAM HYDROBROMIDE 20 MG PO TABS
10.0000 mg | ORAL_TABLET | Freq: Every day | ORAL | Status: DC
  Administered 2019-02-25 – 2019-02-26 (×2): 10 mg via ORAL
  Filled 2019-02-25 (×3): qty 1

## 2019-02-25 MED ORDER — NITROGLYCERIN 0.4 MG SL SUBL: 0.4000 mg | SUBLINGUAL_TABLET | SUBLINGUAL | Status: DC | PRN

## 2019-02-25 MED ORDER — PANTOPRAZOLE SODIUM 40 MG PO TBEC
40.0000 mg | DELAYED_RELEASE_TABLET | Freq: Every day | ORAL | Status: DC
  Administered 2019-02-25 – 2019-02-27 (×3): 40 mg via ORAL
  Filled 2019-02-25 (×3): qty 1

## 2019-02-25 MED ORDER — PRAZOSIN HCL 1 MG PO CAPS
2.0000 mg | ORAL_CAPSULE | Freq: Every day | ORAL | Status: DC
  Administered 2019-02-25: 22:00:00 2 mg via ORAL
  Filled 2019-02-25: qty 1

## 2019-02-25 MED ORDER — MONTELUKAST SODIUM 10 MG PO TABS
10.0000 mg | ORAL_TABLET | Freq: Every morning | ORAL | Status: DC
  Administered 2019-02-25 – 2019-02-27 (×3): 10 mg via ORAL
  Filled 2019-02-25 (×3): qty 1

## 2019-02-25 MED ORDER — BICTEGRAVIR-EMTRICITAB-TENOFOV 50-200-25 MG PO TABS
1.0000 | ORAL_TABLET | Freq: Every day | ORAL | Status: DC
  Administered 2019-02-25 – 2019-02-27 (×3): 1 via ORAL
  Filled 2019-02-25 (×5): qty 1

## 2019-02-25 MED ORDER — ONDANSETRON HCL 4 MG/2ML IJ SOLN: 4.0000 mg | Freq: Four times a day (QID) | INTRAMUSCULAR | Status: DC | PRN

## 2019-02-25 MED ORDER — ENOXAPARIN SODIUM 40 MG/0.4ML ~~LOC~~ SOLN: 40.0000 mg | SUBCUTANEOUS | Status: DC

## 2019-02-25 MED ORDER — NICOTINE 21 MG/24HR TD PT24
21.0000 mg | MEDICATED_PATCH | Freq: Every day | TRANSDERMAL | Status: DC
  Administered 2019-02-25 – 2019-02-27 (×3): 21 mg via TRANSDERMAL
  Filled 2019-02-25 (×3): qty 1

## 2019-02-25 MED ORDER — ENOXAPARIN SODIUM 40 MG/0.4ML ~~LOC~~ SOLN
40.0000 mg | SUBCUTANEOUS | Status: DC
  Administered 2019-02-25: 13:00:00 40 mg via SUBCUTANEOUS
  Filled 2019-02-25: qty 0.4

## 2019-02-25 NOTE — Progress Notes (Signed)
Upon walking into patients room, patient stated that "No doctor has been in here after I got to this room." Patient stated concern about COVID-19 test sent to [facility]. Patient stated "my first test came back negative so I sent my grandkids to someone else's house. My whole family could be exposed if its positive." Patient requesting to speak with doctor about plan of care. Dr. Sharl Ma notified.

## 2019-02-25 NOTE — Progress Notes (Signed)
Called by RN that patient troponin went up to 0.58.  He still continues to have left-sided chest pain. Repeat EKG showed no acute changes.  I called and discussed with cardiology fellow Carncelli at [facility] who recommended to start heparin, nitroglycerin as needed and transfer patient to [facility] for further evaluation including cardiac catheterization.  Patient was admitted this morning, echocardiogram was ordered and is currently pending.  Plan Start heparin per pharmacy Nitroglycerin 0.4 mg sublingual every 5 minutes x3 Discussed with my colleague Dr. Toniann Fail, for transferring patient to [facility]. Cardiology consultation once patient reaches at [facility].

## 2019-02-25 NOTE — H&P (Addendum)
TRH H&P    Patient Demographics:    Enrigue CatenaGlen Kneale, is a 38 y.o. male  MRN: [id number]  DOB - 05/06/1981  Admit Date - 02/24/2019  Referring MD/NP/PA: Burgess AmorJulie Idol  [facility]  Patient coming from: Home  Chief complaint-cough, shortness of breath   HPI:    Enrigue CatenaGlen Algeo  is a 38 y.o. male, with history of HIV with undetectable viral load, [facility] infectious disease [facility], PTSD, migraine came with 1 week history of cough, nonproductive.  Also complained of midsternal chest pain with shortness of breath.  Which developed this afternoon.  Patient also had fever with T-max of 101, in the ED temperature 100.5.  Patient also said that he had potential COVID 19 exposure at workplace.  There are 3 coworkers who have tested positive for COVID and he had a potential direct contact with them. Denies abdominal pain or dysuria. Denies headache or myalgias. Denies nausea vomiting or diarrhea.  In the ED patient was found to have mild elevation of troponin 0.05, 0.07.  ED provider [facility], who recommended echocardiogram to rule out myocarditis if patient has COVID-19 positive.  Chest x-ray showed patchy left basilar infiltrate. COVID-19 test in the ED came back negative    Review of systems:    In addition to the HPI above,    All other systems reviewed and are negative.    Past History of the following :    Past Medical History:  Diagnosis Date  . Allergy   . Attention deficit disorder with hyperactivity   . Depression   . HIV (human immunodeficiency virus infection) (HCC)   . Migraine   . OSA (obstructive sleep apnea)   . Plantar fascial fibromatosis   . PTSD (post-traumatic stress disorder)       Past Surgical History:  Procedure Laterality Date  . HAND SURGERY    . shrapnel removal        Social History:       Social History   Tobacco Use  . Smoking status: Current Every Day Smoker    Packs/day: 1.00    Types: Cigarettes    Last attempt to quit: 10/23/2005    Years since quitting: 13.3  . Smokeless tobacco: Never Used  Substance Use Topics  . Alcohol use: No    Alcohol/week: 0.0 standard drinks       Family History :     Family History  Problem Relation Age of Onset  . Diabetes Mother   . Heart disease Mother   . Hypertension Mother   . Diabetes Father   . Heart disease Father   . Hyperlipidemia Father      Home Medications:   Prior to Admission medications   Medication Sig Start Date End Date Taking? Authorizing Provider  bictegravir-emtricitabine-tenofovir AF (BIKTARVY) 50-200-25 MG TABS tablet Take 1 tablet by mouth daily.   Yes [provider]  citalopram (CELEXA) 10 MG tablet Take 10 mg by mouth daily.  Yes [provider]  montelukast (SINGULAIR) 10 MG tablet Take 1 tablet (10 mg total) by mouth at bedtime. Patient taking differently: Take 10 mg by mouth every morning.  05/23/17  Yes Cliffton Asters, MD  prazosin (MINIPRESS) 2 MG capsule Take 2 mg by mouth at bedtime.   Yes [provider]     Allergies:     Allergies  Allergen Reactions  . Sulfa Antibiotics Anaphylaxis  . Iodine Rash  . Latex Rash     Physical Exam:   Vitals  Blood pressure 109/75, pulse 79, temperature 98.2 F (36.8 C), temperature source Oral, resp. rate 17, height 5\' 9"  (1.753 m), weight 89.4 kg, SpO2 96 %.  1.  General: Appears in no acute distress  2. Psychiatric: Alert, oriented x3, intact insight and judgment  3. Neurologic: Cranial nerves II through XII grossly intact, no focal deficit noted  4. HEENMT:  Atraumatic normocephalic, extraocular muscles intact  5. Respiratory : Clear to auscultation bilaterally, no wheezing or crackles auscultated  6. Cardiovascular : S1-S2, regular, no murmur auscultated  7. Gastrointestinal:  Abdominal  soft, nontender, no organomegaly      Data Review:    CBC Recent Labs  Lab 02/24/19 2118  WBC 9.4  HGB 14.8  HCT 41.7  PLT 248  MCV 86.7  MCH 30.8  MCHC 35.5  RDW 12.4  LYMPHSABS 2.0  MONOABS 1.1*  EOSABS 0.1  BASOSABS 0.0   ------------------------------------------------------------------------------------------------------------------  Results for orders placed or performed during the hospital encounter of 02/24/19 (from the past 48 hour(s))  Basic metabolic panel     Status: Abnormal   Collection Time: 02/24/19  9:18 PM  Result Value Ref Range   Sodium 135 135 - 145 mmol/L   Potassium 3.4 (L) 3.5 - 5.1 mmol/L   Chloride 100 98 - 111 mmol/L   CO2 24 22 - 32 mmol/L   Glucose, Bld 148 (H) 70 - 99 mg/dL   BUN 13 6 - 20 mg/dL   Creatinine, Ser 9.03 0.61 - 1.24 mg/dL   Calcium 8.8 (L) 8.9 - 10.3 mg/dL   GFR calc non Af Amer >60 >60 mL/min   GFR calc Af Amer >60 >60 mL/min   Anion gap 11 5 - 15    Comment: Performed at [facility], [address] [place], [place] [address]  CBC WITH DIFFERENTIAL     Status: Abnormal   Collection Time: 02/24/19  9:18 PM  Result Value Ref Range   WBC 9.4 4.0 - 10.5 K/uL   RBC 4.81 4.22 - 5.81 MIL/uL   Hemoglobin 14.8 13.0 - 17.0 g/dL   HCT 32.9 19.1 - 66.0 %   MCV 86.7 80.0 - 100.0 fL   MCH 30.8 26.0 - 34.0 pg   MCHC 35.5 30.0 - 36.0 g/dL   RDW 60.0 45.9 - 97.7 %   Platelets 248 150 - 400 K/uL   nRBC 0.0 0.0 - 0.2 %   Neutrophils Relative % 64 %   Neutro Abs 6.0 1.7 - 7.7 K/uL   Lymphocytes Relative 22 %   Lymphs Abs 2.0 0.7 - 4.0 K/uL   Monocytes Relative 12 %   Monocytes Absolute 1.1 (H) 0.1 - 1.0 K/uL   Eosinophils Relative 1 %   Eosinophils Absolute 0.1 0.0 - 0.5 K/uL   Basophils Relative 0 %   Basophils Absolute 0.0 0.0 - 0.1 K/uL   Immature Granulocytes 1 %   Abs Immature Granulocytes 0.06 0.00 - 0.07 K/uL  Comment: Performed at [facility], [address], [place], [place] [address]  Troponin I - ONCE - STAT      Status: Abnormal   Collection Time: 02/24/19  9:18 PM  Result Value Ref Range   Troponin I 0.05 (HH) <0.03 ng/mL    Comment: CRITICAL RESULT CALLED TO, READ BACK BY AND VERIFIED WITH: PRUITT,RN AT 2215 ON 5.4.20 BY ISLEY,B Performed at [facility], [address] [place], [place] [address]   Troponin I - ONCE - STAT     Status: Abnormal   Collection Time: 02/25/19 12:00 AM  Result Value Ref Range   Troponin I 0.07 (HH) <0.03 ng/mL    Comment: CRITICAL VALUE NOTED.  VALUE IS CONSISTENT WITH PREVIOUSLY REPORTED AND CALLED VALUE. Performed at [facility], [address] [place], [place] [address]   SARS Coronavirus 2 (CEPHEID- Performed in [facility] hospital lab), Hosp Order     Status: None   Collection Time: 02/25/19  2:00 AM  Result Value Ref Range   SARS Coronavirus 2 NEGATIVE NEGATIVE    Comment: (NOTE) If result is NEGATIVE SARS-CoV-2 target nucleic acids are NOT DETECTED. The SARS-CoV-2 RNA is generally detectable in upper and lower  respiratory specimens during the acute phase of infection. The lowest  concentration of SARS-CoV-2 viral copies this assay can detect is 250  copies / mL. A negative result does not preclude SARS-CoV-2 infection  and should not be used as the sole basis for treatment or other  patient management decisions.  A negative result may occur with  improper specimen collection / handling, submission of specimen other  than nasopharyngeal swab, presence of viral mutation(s) within the  areas targeted by this assay, and inadequate number of viral copies  (<250 copies / mL). A negative result must be combined with clinical  observations, patient history, and epidemiological information. If result is POSITIVE SARS-CoV-2 target nucleic acids are DETECTED. The SARS-CoV-2 RNA is generally detectable in upper and lower  respiratory specimens dur ing the acute phase of infection.  Positive  results are indicative of active infection with SARS-CoV-2.   Clinical  correlation with patient history and other diagnostic information is  necessary to determine patient infection status.  Positive results do  not rule out bacterial infection or co-infection with other viruses. If result is PRESUMPTIVE POSTIVE SARS-CoV-2 nucleic acids MAY BE PRESENT.   A presumptive positive result was obtained on the submitted specimen  and confirmed on repeat testing.  While 2019 novel coronavirus  (SARS-CoV-2) nucleic acids may be present in the submitted sample  additional confirmatory testing may be necessary for epidemiological  and / or clinical management purposes  to differentiate between  SARS-CoV-2 and other Sarbecovirus currently known to infect humans.  If clinically indicated additional testing with an alternate test  methodology [phone]) is advised. The SARS-CoV-2 RNA is generally  detectable in upper and lower respiratory sp ecimens during the acute  phase of infection. The expected result is Negative. Fact Sheet for Patients:  BoilerBrush.com.cy Fact Sheet for Healthcare Providers: https://pope.com/ This test is not yet approved or cleared by the [place] FDA and has been authorized for detection and/or diagnosis of SARS-CoV-2 by FDA under an Emergency Use Authorization (EUA).  This EUA will remain in effect (meaning this test can be used) for the duration of the COVID-19 declaration under Section 564(b)(1) of the Act, 21 U.S.C. section 360bbb-3(b)(1), unless the authorization is terminated or revoked sooner. Performed at [facility], [address]  [place], [place] [address]     Chemistries  Recent Labs  Lab 02/24/19 2118  NA 135  K 3.4*  CL 100  CO2 24  GLUCOSE 148*  BUN 13  CREATININE 1.13  CALCIUM 8.8*   Cardiac Enzymes: Recent Labs  Lab 02/24/19 2118 02/25/19 0000  TROPONINI 0.05* 0.07*     --------------------------------------------------------------------------------------------------------------- Urine analysis:    Component Value Date/Time   COLORURINE YELLOW 12/07/2015 1003   APPEARANCEUR CLEAR 12/07/2015 1003   LABSPEC 1.023 12/07/2015 1003   PHURINE 6.0 12/07/2015 1003   GLUCOSEU NEGATIVE 12/07/2015 1003   HGBUR NEGATIVE 12/07/2015 1003   BILIRUBINUR NEGATIVE 12/07/2015 1003   KETONESUR NEGATIVE 12/07/2015 1003   PROTEINUR NEGATIVE 12/07/2015 1003   NITRITE NEGATIVE 12/07/2015 1003   LEUKOCYTESUR NEGATIVE 12/07/2015 1003      Imaging Results:    Dg Chest Port 1 View  Result Date: 02/24/2019 CLINICAL DATA:  Shortness of breath and cough for 1 week, fevers EXAM: PORTABLE CHEST 1 VIEW COMPARISON:  None. FINDINGS: Cardiac shadows within normal limits. The lungs are well aerated bilaterally. Patchy infiltrate is noted in the left lung base. No sizable effusion is seen. No bony abnormality is noted. IMPRESSION: Patchy left basilar infiltrate. Electronically Signed   By: Alcide Clever M.D.   On: 02/24/2019 22:21    My personal review of EKG: Rhythm NSR   Assessment & Plan:    Active Problems:   CAP (community acquired pneumonia)   1. Community-acquired pneumonia-COVID-19 test in the ED negative.  Will start ceftriaxone and Zithromax.  Check urinary strep pneumo antigen, blood cultures x 2.  2. Chest pain/elevated troponin-patient has mild elevation of troponin, complaint of chest pain.  Initially cardiology fellow at [facility] was consulted who recommended echocardiogram.  Will obtain echocardiogram and consult cardiology in a.m.  Patient has significant family history of CAD.  Will cycle troponin every 6 hours x3.  3. HIV-patient has undetectable viral load as per patient, last CD4 count in 2018 was 930.  Will check CD4 count today.  Patient follows ID clinic at [facility] every 6 months.  Continue antiretroviral therapy.    DVT Prophylaxis-   Lovenox   AM  Labs Ordered, also please review Full Orders  Family Communication: Admission, patients condition and plan of care including tests being ordered have been discussed with the patient  who indicate understanding and agree with the plan and Code Status.  Code Status: Full code  Admission status: Inpatient: Based on patients clinical presentation and evaluation of above clinical data, I have made determination that patient meets Inpatient criteria at this time.  Time spent in minutes : 60 minutes   Meredeth Ide M.D on 02/25/2019 at 4:06 AM

## 2019-02-25 NOTE — Progress Notes (Signed)
Patient placed on telemetry box 24.

## 2019-02-25 NOTE — Progress Notes (Signed)
Called by RN that patient heart rate went down to 30s as soon as she started heparin.  Went to see patient, heart rate improved to 60s to 70s.  Patient is alert oriented x3.  Blood pressure 73/44.  Patient takes prazosin for PTSD and was given prazosin at 10:10 PM. Denies any chest pain.  Plan Stat EKG Hold heparin for now Fluid bolus of IV normal saline 2501 Continue to monitor patient's blood pressure  Called and discussed with cardiology fellow Dr. Liane Comber, who says is likely vasovagal syncope.  He does not feel that heparin has to do anything with this.  I will still hold heparin for now and it can be started once patient reaches at [facility].

## 2019-02-25 NOTE — Progress Notes (Signed)
CRITICAL VALUE ALERT  Critical Value:  Troponin 0.08  Date & Time Notied:  02/25/2019 1497  Provider Notified: Dr. Sharl Ma  Orders Received/Actions taken:

## 2019-02-25 NOTE — Progress Notes (Signed)
ANTICOAGULATION CONSULT NOTE - Initial Consult  Pharmacy Consult for heparin dosing Indication:  ACS/STEMI  Allergies  Allergen Reactions  . Sulfa Antibiotics Anaphylaxis  . Iodine Rash  . Latex Rash    Patient Measurements: Height: 5\' 9"  (175.3 cm) Weight: 187 lb 9.8 oz (85.1 kg) IBW/kg (Calculated) : 70.7 Heparin Dosing Weight: HEPARIN DW (KG): 85.1  Vital Signs: Temp: 99.3 F (37.4 C) (05/05 2203) Temp Source: Oral (05/05 2203) BP: 121/82 (05/05 2203) Pulse Rate: 69 (05/05 2203)  Labs: Recent Labs    02/24/19 2118  02/25/19 0551 02/25/19 1224 02/25/19 1810  HGB 14.8  --  15.4  --   --   HCT 41.7  --  44.8  --   --   PLT 248  --  241  --   --   CREATININE 1.13  --  1.02  --   --   TROPONINI 0.05*   < > 0.08* 0.08* 0.57*   < > = values in this interval not displayed.    Estimated Creatinine Clearance: 104.2 mL/min (by C-G formula based on SCr of 1.02 mg/dL).   Medical History: Past Medical History:  Diagnosis Date  . Allergy   . Attention deficit disorder with hyperactivity   . Depression   . HIV (human immunodeficiency virus infection) (HCC)   . Migraine   . OSA (obstructive sleep apnea)   . Plantar fascial fibromatosis   . PTSD (post-traumatic stress disorder)      Assessment:  Pharmacy consulted to dose heparin for this 38 yo male with elevated troponin I.  Patient was not on any anti-coagulant medications prior to admission, but he did receive one dose of Lovenox 40mg  sub-q at ~1200 this afternoon, so no heparin bolus will be given.   Goal of Therapy:  Heparin level 0.3-0.7 units/ml Monitor platelets by anticoagulation protocol: Yes   Plan:  Start heparin infusion at 1000 units/hr Check anti-Xa level in 6-8 hours and daily while on heparin Continue to monitor H&H and platelets  Tama High 02/25/2019,10:28 PM

## 2019-02-25 NOTE — Progress Notes (Signed)
CRITICAL VALUE ALERT  Critical Value:  Troponin 0.57  Date & Time Notied:  02/25/19 2137  Provider Notified: Dr. Sharl Ma  Orders Received/Actions taken:

## 2019-02-25 NOTE — Progress Notes (Signed)
Patient seen and examined.  Admitted after midnight secondary to fever, dry cough and chest discomfort.  Patient has had exposure to coworkers that have tested positive for COVID-19; as an underlying risk patient is HIV positive (with undetectable viral load and a CD4 count of 934 for the latest available report).  He is currently hemodynamically stable, low-grade temperature appreciated.  No requiring oxygen supplementation.  Chest x-ray with left lower lobe infiltrates.  Actively receiving treatment for community-acquired pneumonia.  CEPHEID test for COVID done in ED and negative.  Please refer to H&P written by Dr. Sharl Ma for further info/details on admission.  Plan: -Case discussed with infectious disease (Dr. Merceda Elks); recommendations given to maintain patient on droplet precautions, continue treatment for community-acquired pneumonia, repeat COVID-19 test and sent to [facility]. -Minimize any interventional treatment that could increase aerolization. -ok to keep at [facility] while investigating him in current circumstances. -follow results of Echo and continue cycling troponin and monitoring him on telemetry as already ordered.   Vassie Loll MD [phone]

## 2019-02-26 ENCOUNTER — Inpatient Hospital Stay (HOSPITAL_COMMUNITY): Payer: No Typology Code available for payment source

## 2019-02-26 DIAGNOSIS — B20 Human immunodeficiency virus [HIV] disease: Secondary | ICD-10-CM

## 2019-02-26 DIAGNOSIS — I300 Acute nonspecific idiopathic pericarditis: Secondary | ICD-10-CM

## 2019-02-26 DIAGNOSIS — R0789 Other chest pain: Secondary | ICD-10-CM

## 2019-02-26 LAB — HEPARIN LEVEL (UNFRACTIONATED)
Heparin Unfractionated: <0.1 IU/mL — ABNORMAL LOW (ref 0.30–0.70)
Heparin Unfractionated: 0.19 IU/mL — ABNORMAL LOW (ref 0.30–0.70)

## 2019-02-26 LAB — TROPONIN I
Troponin I: 0.11 ng/mL (ref <0.03)
Troponin I: 0.1 ng/mL (ref <0.03)

## 2019-02-26 LAB — CBC
HCT: 39.1 % (ref 39.0–52.0)
Hemoglobin: 14 g/dL (ref 13.0–17.0)
MCH: 31 pg (ref 26.0–34.0)
MCHC: 35.8 g/dL (ref 30.0–36.0)
MCV: 86.7 fL (ref 80.0–100.0)
Platelets: 222 10*3/uL (ref 150–400)
RBC: 4.51 MIL/uL (ref 4.22–5.81)
RDW: 12.1 % (ref 11.5–15.5)
WBC: 6.4 10*3/uL (ref 4.0–10.5)
nRBC: 0 % (ref 0.0–0.2)

## 2019-02-26 LAB — NOVEL CORONAVIRUS, NAA (HOSP ORDER, SEND-OUT TO REF LAB; TAT 18-24 HRS): SARS-CoV-2, NAA: NOT DETECTED

## 2019-02-26 LAB — ECHOCARDIOGRAM LIMITED
Height: 69 in
Weight: 2984.15 oz

## 2019-02-26 LAB — SARS CORONAVIRUS 2 BY RT PCR (HOSPITAL ORDER, PERFORMED IN ~~LOC~~ HOSPITAL LAB): SARS Coronavirus 2: NEGATIVE

## 2019-02-26 LAB — MRSA PCR SCREENING: MRSA by PCR: NEGATIVE

## 2019-02-26 MED ORDER — ASPIRIN 81 MG PO CHEW
81.0000 mg | CHEWABLE_TABLET | Freq: Every day | ORAL | Status: DC
  Administered 2019-02-26 – 2019-02-27 (×2): 81 mg via ORAL
  Filled 2019-02-26 (×2): qty 1

## 2019-02-26 MED ORDER — ATORVASTATIN CALCIUM 40 MG PO TABS
40.0000 mg | ORAL_TABLET | Freq: Every day | ORAL | Status: DC
  Administered 2019-02-26: 40 mg via ORAL
  Filled 2019-02-26: qty 1

## 2019-02-26 MED ORDER — HEPARIN BOLUS VIA INFUSION
2000.0000 [IU] | Freq: Once | INTRAVENOUS | Status: AC
  Administered 2019-02-27: 2000 [IU] via INTRAVENOUS
  Filled 2019-02-26: qty 2000

## 2019-02-26 MED ORDER — HEPARIN BOLUS VIA INFUSION
2500.0000 [IU] | Freq: Once | INTRAVENOUS | Status: AC
  Administered 2019-02-26: 2500 [IU] via INTRAVENOUS
  Filled 2019-02-26: qty 2500

## 2019-02-26 MED ORDER — HEPARIN (PORCINE) 25000 UT/250ML-% IV SOLN
1450.0000 [IU]/h | INTRAVENOUS | Status: DC
  Administered 2019-02-26: 1300 [IU]/h via INTRAVENOUS
  Filled 2019-02-26: qty 250

## 2019-02-26 NOTE — Progress Notes (Signed)
Dr. Sharl Ma notified BP 87/56, heart rate 67. New order to increase NS to 167mls/hr and transfer to [facility] ICU at this time. MD notified of EKG results.

## 2019-02-26 NOTE — Progress Notes (Signed)
Patient transferred to [facility] unit. Report given to Sallis, Charity fundraiser. Patient alert and oriented. Patient requests that if he is unable to communicate at any time he would like his wife, Pierson Gainous to be given updates regarding care. Her number is [phone].

## 2019-02-26 NOTE — Progress Notes (Signed)
ANTICOAGULATION CONSULT NOTE  Pharmacy Consult for heparin dosing Indication:  Chest pain, elevated troponin   Allergies  Allergen Reactions  . Sulfa Antibiotics Anaphylaxis  . Iodine Rash  . Latex Rash    Patient Measurements: Height: 5\' 9"  (175.3 cm) Weight: 186 lb 8.2 oz (84.6 kg) IBW/kg (Calculated) : 70.7 Heparin Dosing Weight: actual body weight   Vital Signs: Temp: 98.2 F (36.8 C) (05/06 1207) Temp Source: Oral (05/06 1207) BP: 116/66 (05/06 1207) Pulse Rate: 77 (05/06 1207)  Labs: Recent Labs    02/24/19 2118  02/25/19 0551 02/25/19 1224 02/25/19 1810 02/25/19 2241 02/26/19 0854 02/26/19 1209  HGB 14.8  --  15.4  --   --   --  14.0  --   HCT 41.7  --  44.8  --   --   --  39.1  --   PLT 248  --  241  --   --   --  222  --   APTT  --   --   --   --   --  40*  --   --   HEPARINUNFRC  --   --   --   --   --  <0.10*  --  <0.10*  CREATININE 1.13  --  1.02  --   --   --   --   --   TROPONINI 0.05*   < > 0.08* 0.08* 0.57*  --   --  0.11*   < > = values in this interval not displayed.    Estimated Creatinine Clearance: 96.3 mL/min (by C-G formula based on SCr of 1.02 mg/dL).   Medical History: Past Medical History:  Diagnosis Date  . Allergy   . Attention deficit disorder with hyperactivity   . Depression   . HIV (human immunodeficiency virus infection) (HCC)   . Migraine   . OSA (obstructive sleep apnea)   . Plantar fascial fibromatosis   . PTSD (post-traumatic stress disorder)      Assessment: Pharmacy consulted to dose heparin for this 38 y/o male with elevated troponin. Patient not on any anticoagulation PTA. Cardiology evaluation pending.  Today, 02/26/19  Heparin level this afternoon < 0.1 units/mL, subtherapeutic  CBC WNL  Discussed with RN-confirmed running at specified rate, no complications/interruptions in therapy since heparin infusion resumed at 0524 this AM.  No bleeding issues per nursing   Goal of Therapy:  Heparin level  0.3-0.7 units/ml Monitor platelets by anticoagulation protocol: Yes   Plan:   Heparin 2500 units bolus x 1, then increase heparin infusion to 1300 units/hr  Heparin level 6 hours after rate change  Daily CBC and heparin level  Monitor closely for s/sx of bleeding  Greer Pickerel M 02/26/2019,2:41 PM

## 2019-02-26 NOTE — Progress Notes (Signed)
  Echocardiogram 2D Echocardiogram has been performed.  Jason Sheppard 02/26/2019, 3:34 PM

## 2019-02-26 NOTE — Progress Notes (Signed)
Patient ID: Jason Sheppard, male   DOB: 05/07/1981, 38 y.o.   MRN: [id number]  PROGRESS NOTE    Jason Sheppard  OXB:[id number] DOB: 1980/12/28 DOA: 02/24/2019 PCP: [facility]   Brief Narrative:  38 year old male with history of HIV on antiretroviral therapy with undetectable viral load currently and followed by infectious disease at [facility], PTSD, migraine presented with cough and shortness of breath.  He was found to be febrile with potential COVID-19 exposure at workplace.  Chest x-ray showed patchy left basilar infiltrate.  COVID-19 test was negative.  He was found to have elevated troponin with chest pain.  Cardiology recommended transfer to [facility].  Assessment & Plan:   Active Problems:   CAP (community acquired pneumonia)  Community-acquired bacterial left lower lobar pneumonia with hypoxia -Continue Rocephin and Zithromax.  COVID-19 rapid testing was negative on admission in the ED at [facility].  Will repeat rapid COVID-19 testing.  If that is negative, will discontinue isolation -Currently on room air. -Strep pneumo urinary antigen negative -Cultures negative so far  Chest pain with mildly positive troponins -Currently chest pain-free.  Cardiology recommended transfer to [facility].  We will switch to n.p.o. for now.  2D echo pending.  Troponin only slightly elevated. -Awaiting cardiology evaluation   HIV on antiretrovirals  -Continue antiretroviral.  Patient has undetectable viral load.  Outpatient follow-up with ID at [facility].   DVT prophylaxis: Lovenox Code Status: Full Family Communication: Spoke to patient and wife on video call Disposition Plan: Home in 1 to 2 days once clinically improved  Consultants: Cardiology  Procedures: None Antimicrobials:  Rocephin and Zithromax from 02/25/2019 onwards  Subjective: Patient seen and examined at bedside.  He feels slightly better.  Currently denies any chest pain.  His breathing  is improved.  Still coughing intermittently.  Objective: Vitals:   02/26/19 0200 02/26/19 0450 02/26/19 0540 02/26/19 0731  BP: 102/67 106/65 106/67 96/61  Pulse: 78 73 70   Resp: (!) 25 19 20    Temp:  98.2 F (36.8 C)  98.1 F (36.7 C)  TempSrc:  Oral  Oral  SpO2: 94% 96% 96% 97%  Weight:  84.6 kg    Height: 5\' 9"  (1.753 m) 5\' 9"  (1.753 m)      Intake/Output Summary (Last 24 hours) at 02/26/2019 1150 Last data filed at 02/26/2019 0700 Gross per 24 hour  Intake 3367.06 ml  Output 4 ml  Net 3363.06 ml   Filed Weights   02/25/19 0446 02/26/19 0156 02/26/19 0450  Weight: 85.1 kg 83.8 kg 84.6 kg    Examination:  General exam: Appears calm and comfortable  Respiratory system: Bilateral decreased breath sounds at bases with some scattered crackles mostly on the left side Cardiovascular system: S1 & S2 heard, Rate controlled Gastrointestinal system: Abdomen is nondistended, soft and nontender. Normal bowel sounds heard. Extremities: No cyanosis, clubbing, edema    Data Reviewed: I have personally reviewed following labs and imaging studies  CBC: Recent Labs  Lab 02/24/19 2118 02/25/19 0551 02/26/19 0854  WBC 9.4 8.9 6.4  NEUTROABS 6.0  --   --   HGB 14.8 15.4 14.0  HCT 41.7 44.8 39.1  MCV 86.7 87.8 86.7  PLT 248 241 222   Basic Metabolic Panel: Recent Labs  Lab 02/24/19 2118 02/25/19 0551  NA 135 137  K 3.4* 4.0  CL 100 100  CO2 24 28  GLUCOSE 148* 131*  BUN 13 12  CREATININE 1.13 1.02  CALCIUM  8.8* 9.0   GFR: Estimated Creatinine Clearance: 96.3 mL/min (by C-G formula based on SCr of 1.02 mg/dL). Liver Function Tests: Recent Labs  Lab 02/25/19 0551  AST 23  ALT 51*  ALKPHOS 57  BILITOT 1.1  PROT 7.8  ALBUMIN 4.3   No results for input(s): LIPASE, AMYLASE in the last 168 hours. No results for input(s): AMMONIA in the last 168 hours. Coagulation Profile: No results for input(s): INR, PROTIME in the last 168 hours. Cardiac Enzymes: Recent Labs   Lab 02/24/19 2118 02/25/19 0000 02/25/19 0551 02/25/19 1224 02/25/19 1810  TROPONINI 0.05* 0.07* 0.08* 0.08* 0.57*   BNP (last 3 results) No results for input(s): PROBNP in the last 8760 hours. HbA1C: Recent Labs    02/25/19 0836  HGBA1C 5.7*   CBG: No results for input(s): GLUCAP in the last 168 hours. Lipid Profile: No results for input(s): CHOL, HDL, LDLCALC, TRIG, CHOLHDL, LDLDIRECT in the last 72 hours. Thyroid Function Tests: Recent Labs    02/25/19 0836  TSH 1.922   Anemia Panel: No results for input(s): VITAMINB12, FOLATE, FERRITIN, TIBC, IRON, RETICCTPCT in the last 72 hours. Sepsis Labs: No results for input(s): PROCALCITON, LATICACIDVEN in the last 168 hours.  Recent Results (from the past 240 hour(s))  SARS Coronavirus 2 (CEPHEID- Performed in [facility] hospital lab), Hosp Order     Status: None   Collection Time: 02/25/19  2:00 AM  Result Value Ref Range Status   SARS Coronavirus 2 NEGATIVE NEGATIVE Final    Comment: (NOTE) If result is NEGATIVE SARS-CoV-2 target nucleic acids are NOT DETECTED. The SARS-CoV-2 RNA is generally detectable in upper and lower  respiratory specimens during the acute phase of infection. The lowest  concentration of SARS-CoV-2 viral copies this assay can detect is 250  copies / mL. A negative result does not preclude SARS-CoV-2 infection  and should not be used as the sole basis for treatment or other  patient management decisions.  A negative result may occur with  improper specimen collection / handling, submission of specimen other  than nasopharyngeal swab, presence of viral mutation(s) within the  areas targeted by this assay, and inadequate number of viral copies  (<250 copies / mL). A negative result must be combined with clinical  observations, patient history, and epidemiological information. If result is POSITIVE SARS-CoV-2 target nucleic acids are DETECTED. The SARS-CoV-2 RNA is generally detectable in upper and  lower  respiratory specimens dur ing the acute phase of infection.  Positive  results are indicative of active infection with SARS-CoV-2.  Clinical  correlation with patient history and other diagnostic information is  necessary to determine patient infection status.  Positive results do  not rule out bacterial infection or co-infection with other viruses. If result is PRESUMPTIVE POSTIVE SARS-CoV-2 nucleic acids MAY BE PRESENT.   A presumptive positive result was obtained on the submitted specimen  and confirmed on repeat testing.  While 2019 novel coronavirus  (SARS-CoV-2) nucleic acids may be present in the submitted sample  additional confirmatory testing may be necessary for epidemiological  and / or clinical management purposes  to differentiate between  SARS-CoV-2 and other Sarbecovirus currently known to infect humans.  If clinically indicated additional testing with an alternate test  methodology [phone]) is advised. The SARS-CoV-2 RNA is generally  detectable in upper and lower respiratory sp ecimens during the acute  phase of infection. The expected result is Negative. Fact Sheet for Patients:  BoilerBrush.com.cy Fact Sheet for Healthcare Providers: https://pope.com/ This  test is not yet approved or cleared by the [place] and has been authorized for detection and/or diagnosis of SARS-CoV-2 by FDA under an Emergency Use Authorization (EUA).  This EUA will remain in effect (meaning this test can be used) for the duration of the COVID-19 declaration under Section 564(b)(1) of the Act, 21 U.S.C. section 360bbb-3(b)(1), unless the authorization is terminated or revoked sooner. Performed at [facility], [address] [place], [place] [address]   Novel Coronavirus,NAA,(SEND-OUT TO REF LAB - TAT 24-48 hrs); Hosp Order     Status: None   Collection Time: 02/25/19  5:05 AM  Result Value Ref Range Status   SARS-CoV-2, NAA  NOT DETECTED NOT DETECTED Final    Comment: (NOTE) This test was developed and its performance characteristics determined by [employer]. This test has not been FDA cleared or approved. This test has been authorized by FDA under an Emergency Use Authorization (EUA). This test is only authorized for the duration of time the declaration that circumstances exist justifying the authorization of the emergency use of in vitro diagnostic tests for detection of SARS-CoV-2 virus and/or diagnosis of COVID-19 infection under section 564(b)(1) of the Act, 21 U.S.C. 604VWU-9(W)(1), unless the authorization is terminated or revoked sooner. When diagnostic testing is negative, the possibility of a false negative result should be considered in the context of a patient's recent exposures and the presence of clinical signs and symptoms consistent with COVID-19. An individual without symptoms of COVID-19 and who is not shedding SARS-CoV-2 virus would expect to have a negative (not detected) result in this assay. Performed  At: [facility] [address] [place], [place] [id number] Jolene Schimke MD AO:[id number]    Coronavirus Source NASOPHARYNGEAL  Final    Comment: Performed at [facility], [address], [place], [place] [address]  Culture, blood (Routine X 2) w Reflex to ID Panel     Status: None (Preliminary result)   Collection Time: 02/25/19  5:50 AM  Result Value Ref Range Status   Specimen Description BLOOD LEFT ARM  Final   Special Requests   Final    BOTTLES DRAWN AEROBIC AND ANAEROBIC Blood Culture adequate volume   Culture   Final    NO GROWTH 1 DAY Performed at [facility], [address], [place], [place] [address]    Report Status PENDING  Incomplete  Culture, blood (Routine X 2) w Reflex to ID Panel     Status: None (Preliminary result)   Collection Time: 02/25/19  5:54 AM  Result Value Ref Range Status   Specimen Description BLOOD RIGHT HAND  Final   Special  Requests   Final    BOTTLES DRAWN AEROBIC AND ANAEROBIC Blood Culture adequate volume   Culture   Final    NO GROWTH 1 DAY Performed at [facility], [address], [place], [place] [address]    Report Status PENDING  Incomplete  MRSA PCR Screening     Status: None   Collection Time: 02/26/19  3:45 AM  Result Value Ref Range Status   MRSA by PCR NEGATIVE NEGATIVE Final    Comment:        The GeneXpert MRSA Assay (FDA approved for NASAL specimens only), is one component of a comprehensive MRSA colonization surveillance program. It is not intended to diagnose MRSA infection nor to guide or monitor treatment for MRSA infections. Performed at [facility], [address] [place], [place] [address]   SARS Coronavirus 2 (CEPHEID- Performed in [facility] hospital lab), Hosp Order     Status: None   Collection Time: 02/26/19  9:02 AM  Result Value Ref Range Status   SARS Coronavirus 2 NEGATIVE NEGATIVE Final    Comment: (NOTE) If result is NEGATIVE SARS-CoV-2 target nucleic acids are NOT DETECTED. The SARS-CoV-2 RNA is generally detectable in upper and lower  respiratory specimens during the acute phase of infection. The lowest  concentration of SARS-CoV-2 viral copies this assay can detect is 250  copies / mL. A negative result does not preclude SARS-CoV-2 infection  and should not be used as the sole basis for treatment or other  patient management decisions.  A negative result may occur with  improper specimen collection / handling, submission of specimen other  than nasopharyngeal swab, presence of viral mutation(s) within the  areas targeted by this assay, and inadequate number of viral copies  (<250 copies / mL). A negative result must be combined with clinical  observations, patient history, and epidemiological information. If result is POSITIVE SARS-CoV-2 target nucleic acids are DETECTED. The SARS-CoV-2 RNA is generally detectable in upper and lower  respiratory  specimens dur ing the acute phase of infection.  Positive  results are indicative of active infection with SARS-CoV-2.  Clinical  correlation with patient history and other diagnostic information is  necessary to determine patient infection status.  Positive results do  not rule out bacterial infection or co-infection with other viruses. If result is PRESUMPTIVE POSTIVE SARS-CoV-2 nucleic acids MAY BE PRESENT.   A presumptive positive result was obtained on the submitted specimen  and confirmed on repeat testing.  While 2019 novel coronavirus  (SARS-CoV-2) nucleic acids may be present in the submitted sample  additional confirmatory testing may be necessary for epidemiological  and / or clinical management purposes  to differentiate between  SARS-CoV-2 and other Sarbecovirus currently known to infect humans.  If clinically indicated additional testing with an alternate test  methodology [phone]) is advised. The SARS-CoV-2 RNA is generally  detectable in upper and lower respiratory sp ecimens during the acute  phase of infection. The expected result is Negative. Fact Sheet for Patients:  BoilerBrush.com.cy Fact Sheet for Healthcare Providers: https://pope.com/ This test is not yet approved or cleared by the [place] FDA and has been authorized for detection and/or diagnosis of SARS-CoV-2 by FDA under an Emergency Use Authorization (EUA).  This EUA will remain in effect (meaning this test can be used) for the duration of the COVID-19 declaration under Section 564(b)(1) of the Act, 21 U.S.C. section 360bbb-3(b)(1), unless the authorization is terminated or revoked sooner. Performed at [facility], [address] [place], [place] [address]          Radiology Studies: Dg Chest [facility] 1 View  Result Date: 02/24/2019 CLINICAL DATA:  Shortness of breath and cough for 1 week, fevers EXAM: PORTABLE CHEST 1 VIEW COMPARISON:   None. FINDINGS: Cardiac shadows within normal limits. The lungs are well aerated bilaterally. Patchy infiltrate is noted in the left lung base. No sizable effusion is seen. No bony abnormality is noted. IMPRESSION: Patchy left basilar infiltrate. Electronically Signed   By: Alcide Clever M.D.   On: 02/24/2019 22:21        Scheduled Meds: . aspirin  81 mg Oral Daily  . atorvastatin  40 mg Oral q1800  . bictegravir-emtricitabine-tenofovir AF  1 tablet Oral Daily  . citalopram  10 mg Oral Daily  . montelukast  10 mg Oral q morning - 10a  .  nicotine  21 mg Transdermal Daily  . pantoprazole  40 mg Oral Daily   Continuous Infusions: . sodium chloride 125 mL/hr at 02/26/19 0700  . azithromycin 500 mg (02/26/19 1047)  . cefTRIAXone (ROCEPHIN)  IV 1 g (02/26/19 0924)  . heparin 1,000 Units/hr (02/26/19 0700)     LOS: 1 day        Glade LloydKshitiz Chaye Misch, MD [facility] 02/26/2019, 11:50 AM

## 2019-02-26 NOTE — Progress Notes (Signed)
Upon starting heparin. Patient stated "I feel dizzy and really weird." Heart rate dropped to 30's on telemetry before coming up to the 50's. BP 73/44. Heart rate 59. Dr. Sharl Ma on the floor to assess patient. Patient given prazosin at 2212. New order placed for EKG, 250 ml bolus, and hold heparin at this time.

## 2019-02-26 NOTE — Progress Notes (Signed)
Pt resting comfortably. He is still reporting chest pain at 6/10 mid-sternal. Pt describes the pain as "pressure" and hasn't changed except intensity. He is alert and oriented and understands that he will be transferred to [facility] 2W by [facility]. I updated his list of contacts per his request. He is requesting that his wife Jason Sheppard be contacted upon admission at [facility].

## 2019-02-26 NOTE — Progress Notes (Signed)
Present for PIV start.  States IV was hurting earlier but fell fine,  PIV WNL  Pt. Rather not get new PIV at this time.  Will return if needed.

## 2019-02-26 NOTE — Consult Note (Signed)
Cardiology Consultation:   Patient ID: Jason Sheppard; [id number]; 06/01/1981   Admit date: 02/24/2019 Date of Consult: 02/26/2019  [facility] Primary Cardiologist: [facility]  Patient Profile:   Jason CatenaGlen Sheppard is a 38 y.o. male with a hx of HIV with undetectable viral load ([facility]), PTSD, OSA, depression, hx of tobacco (quit 2007) use and migraines who is being seen today for the evaluation of chest pain and elevated troponin at the request of Dr. Gwenlyn PerkingMadera.  History of Present Illness:   Mr. Jason Sheppard is a 38 year old male with a history stated above who initially presented to [facility] with a one-week history of nonproductive cough and fever of 101F.  He also reported midsternal chest pain with radiation to his back and shortness of breath that began on the afternoon of admission (02/25/2019). He has known workplace exposure to COVID-19 as 3 coworkers have tested positive. He denies LE swelling, palpitations, orthopnea, dizziness or syncope.  He denies worsening pain with inspiration or changes with movement or meal consumption. He has no prior history of PE or risk factors. Has a significant family history of premature CAD in his father at 40yo and mother who died of massive MI at 40yo. He currently reports that his chest pain has improved to a 2/10 since this morning. He has no other complaints.   In the ED, patient was found to have a mildly elevated troponin at 0.05>0.07>0.08>0.08>0.57.  EKG from 02/25/2019 with no acute abnormalities, NSR. ED provider spoke with on-call cardiologist, Dr. Noel Geroldohen who recommended obtaining an echocardiogram to rule out myocarditis>> not yet performed. CXR showed patchy left basilar infiltrate. ED COVID-19 testing came back negative. Triglycerides were found to be elevated at 380.    AP [facility] who agreed to transfer the patient given the above for further evaluation of chest pain, COVID-19.    Past Medical History:  Diagnosis Date   Allergy    Attention deficit disorder with hyperactivity    Depression    HIV (human immunodeficiency virus infection) (HCC)    Migraine    OSA (obstructive sleep apnea)    Plantar fascial fibromatosis    PTSD (post-traumatic stress disorder)     Past Surgical History:  Procedure Laterality Date   HAND SURGERY     shrapnel removal      Prior to Admission medications   Medication Sig Start Date End Date Taking? Authorizing Provider  bictegravir-emtricitabine-tenofovir AF (BIKTARVY) 50-200-25 MG TABS tablet Take 1 tablet by mouth daily.   Yes [provider]  citalopram (CELEXA) 10 MG tablet Take 10 mg by mouth daily.   Yes [provider]  montelukast (SINGULAIR) 10 MG tablet Take 1 tablet (10 mg total) by mouth at bedtime. Patient taking differently: Take 10 mg by mouth every morning.  05/23/17  Yes Cliffton Astersampbell, John, MD  prazosin (MINIPRESS) 2 MG capsule Take 2 mg by mouth at bedtime.   Yes [provider]    Inpatient Medications: Scheduled Meds:  bictegravir-emtricitabine-tenofovir AF  1 tablet Oral Daily   citalopram  10 mg Oral Daily   montelukast  10 mg Oral q morning - 10a   nicotine  21 mg Transdermal Daily   pantoprazole  40 mg Oral Daily   Continuous Infusions:  sodium chloride 125 mL/hr at 02/26/19 0700   azithromycin 500 mg (02/25/19 1125)   cefTRIAXone (ROCEPHIN)  IV 1 g (02/26/19 0924)   heparin 1,000 Units/hr (  02/26/19 0700)   PRN Meds: acetaminophen **OR** acetaminophen, nitroGLYCERIN, ondansetron **OR** ondansetron (ZOFRAN) IV  Allergies:    Allergies  Allergen Reactions   Sulfa Antibiotics Anaphylaxis   Iodine Rash   Latex Rash    Social History:   Social History   Socioeconomic History   Marital status: Married    Spouse name: Not on file   Number of children: Not on file   Years of education: Not on file   Highest education level: Not on file   Occupational History    Comment[place]  Ecologist strain: Not on file   Food insecurity:    Worry: Not on file    Inability: Not on file   Transportation needs:    Medical: Not on file    Non-medical: Not on file  Tobacco Use   Smoking status: Current Every Day Smoker    Packs/day: 1.00    Types: Cigarettes    Last attempt to quit: 10/23/2005    Years since quitting: 13.3   Smokeless tobacco: Never Used  Substance and Sexual Activity   Alcohol use: No    Alcohol/week: 0.0 standard drinks   Drug use: No   Sexual activity: Yes    Partners: Female  Lifestyle   Physical activity:    Days per week: Not on file    Minutes per session: Not on file   Stress: Not on file  Relationships   Social connections:    Talks on phone: Not on file    Gets together: Not on file    Attends religious service: Not on file    Active member of club or organization: Not on file    Attends meetings of clubs or organizations: Not on file    Relationship status: Not on file   Intimate partner violence:    Fear of current or ex partner: Not on file    Emotionally abused: Not on file    Physically abused: Not on file    Forced sexual activity: Not on file  Other Topics Concern   Not on file  Social History Narrative   Drinks 48 oz coffee daily.    Family History:   Family History  Problem Relation Age of Onset   Diabetes Mother    Heart disease Mother    Hypertension Mother    Diabetes Father    Heart disease Father    Hyperlipidemia Father    Family Status:  Family Status  Relation Name Status   Mother  Deceased   Father  Alive   ROS:  Please see the history of present illness.  All other ROS reviewed and negative.     Physical Exam/Data:   Vitals:   02/26/19 0200 02/26/19 0450 02/26/19 0540 02/26/19 0731  BP: 102/67 106/65 106/67 96/61  Pulse: 78 73 70   Resp: (!) 25 19 20    Temp:  98.2 F (36.8 C)  98.1 F (36.7  C)  TempSrc:  Oral  Oral  SpO2: 94% 96% 96% 97%  Weight:  84.6 kg    Height: 5\' 9"  (1.753 m) 5\' 9"  (1.753 m)      Intake/Output Summary (Last 24 hours) at 02/26/2019 0953 Last data filed at 02/26/2019 0700 Gross per 24 hour  Intake 3367.06 ml  Output 4 ml  Net 3363.06 ml   Filed Weights   02/25/19 0446 02/26/19 0156 02/26/19 0450  Weight: 85.1 kg 83.8 kg 84.6 kg   Body  mass index is 27.54 kg/m.   General:  Well nourished, well developed, in no acute distress HEENT: normal Lymph: no adenopathy Neck: no JVD Endocrine:  No thryomegaly Vascular: No carotid bruits; 4/4 extremity pulses 2+, without bruits  Cardiac:  normal S1, S2; RRR; no murmur  Lungs:  clear to auscultation bilaterally, no wheezing, rhonchi or rales  Abd: soft, nontender, no hepatomegaly  Ext: no edema Musculoskeletal:  No deformities, BUE and BLE strength normal and equal Skin: warm and dry  Neuro:  CNs 2-12 intact, no focal abnormalities noted Psych:  Normal affect   EKG:  The EKG was personally reviewed and demonstrates: 02/25/2019 NSR with no acute ischemic changes, incomplete bundle Telemetry:  Telemetry was personally reviewed and demonstrates:   NSR   Relevant CV Studies:  ECHO: Pending  CATH: None  Laboratory Data:  Chemistry Recent Labs  Lab 02/24/19 2118 02/25/19 0551  NA 135 137  K 3.4* 4.0  CL 100 100  CO2 24 28  GLUCOSE 148* 131*  BUN 13 12  CREATININE 1.13 1.02  CALCIUM 8.8* 9.0  GFRNONAA >60 >60  GFRAA >60 >60  ANIONGAP 11 9    Total Protein  Date Value Ref Range Status  02/25/2019 7.8 6.5 - 8.1 g/dL Final   Albumin  Date Value Ref Range Status  02/25/2019 4.3 3.5 - 5.0 g/dL Final   AST  Date Value Ref Range Status  02/25/2019 23 15 - 41 U/L Final   ALT  Date Value Ref Range Status  02/25/2019 51 (H) 0 - 44 U/L Final   Alkaline Phosphatase  Date Value Ref Range Status  02/25/2019 57 38 - 126 U/L Final   Total Bilirubin  Date Value Ref Range Status   02/25/2019 1.1 0.3 - 1.2 mg/dL Final   Hematology Recent Labs  Lab 02/24/19 2118 02/25/19 0551  WBC 9.4 8.9  RBC 4.81 5.10  HGB 14.8 15.4  HCT 41.7 44.8  MCV 86.7 87.8  MCH 30.8 30.2  MCHC 35.5 34.4  RDW 12.4 12.2  PLT 248 241   Cardiac Enzymes Recent Labs  Lab 02/24/19 2118 02/25/19 0000 02/25/19 0551 02/25/19 1224 02/25/19 1810  TROPONINI 0.05* 0.07* 0.08* 0.08* 0.57*   No results for input(s): TROPIPOC in the last 168 hours.  BNPNo results for input(s): BNP, PROBNP in the last 168 hours.  DDimer No results for input(s): DDIMER in the last 168 hours. TSH:  Lab Results  Component Value Date   TSH 1.922 02/25/2019   Lipids: Lab Results  Component Value Date   CHOL 202 (H) 05/09/2017   HDL 31 (L) 05/09/2017   LDLCALC 95 05/09/2017   TRIG 380 (H) 05/09/2017   CHOLHDL 6.5 (H) 05/09/2017   HgbA1c: Lab Results  Component Value Date   HGBA1C 5.7 (H) 02/25/2019    Radiology/Studies:  Dg Chest Port 1 View  Result Date: 02/24/2019 CLINICAL DATA:  Shortness of breath and cough for 1 week, fevers EXAM: PORTABLE CHEST 1 VIEW COMPARISON:  None. FINDINGS: Cardiac shadows within normal limits. The lungs are well aerated bilaterally. Patchy infiltrate is noted in the left lung base. No sizable effusion is seen. No bony abnormality is noted. IMPRESSION: Patchy left basilar infiltrate. Electronically Signed   By: Alcide Clever M.D.   On: 02/24/2019 22:21   Assessment and Plan:   1. NSTEMI: -Patient initially presented to [facility] with complaints of persistent cough and fever for 1 week prior to admission with known COVID-19 exposure from 3 coworkers -  Initial COVID-19 test, negative with plans to repeat and send to [facility] per primary team after curb-side recommendations with ID>>continue droplet precautions per ID request -Patient also had complaints of constant midsternal chest pain/pressure which began on day of hospital presentation. He reports associated SOB  and mild radiation to his back -Initial troponin is negative and flat  (0.05>0.05>0.07>0.08) however most recent troponin at 0.57 with continued complaints of left sided chest pressure>>initally rated a 6/10 in severity however reports more recently it has decreased to a 2/10 -Has no known personal history of CAD however significant history in mother  (died of massive MI) and father (initlay MI at 76yo with three subsequrent MIs) and prior history of tobacco abuse (quit 2007) -EKG with no acute ischemic changes, NSR>> no change from previous EKG -CXR with left lower infiltrate>> suspicious for CAP -Upon transfer to [facility] on 02/25/2019, patient placed on IV heparin per pharmacy for ACS -Echocardiogram to evaluate for possible myocarditis currently pending. If significant LV dysfunction, WMA>>will need to consider cardiac catheterization versus cardiac CT for further ischemic evaluation -Start ASA 81, no BB in the setting of hypotension   2.  Rule out COVID-19/ CAP: -Patient with a known history of recent exposure who presented to [facility] with complaints of persistent cough and fever for 1 week prior to admission  -Has known exposure to 3 coworkers who recently tested positive for COVID-19 -Initial ED testing, negative -Primary team spoke with ID specialist who suggested continuing treatment for Pneumonia and repeat COVID-19 testing with plans to send to [facility] -In the meantime keep patient on droplet precautions given significant risk factors with HIV positive -Continue currently Abx regimen per primary team   3.  HIV positive: -Patient with an undetectable viral load, last CD4 count 2018 was 930 -CD4 count today-follows with ID clinic at [facility] every 6 months -Continue antiretroviral therapy per primary team  4.  PTSD/depression: -Stable, not current issue -Continue Celexa  5. OSA: -Not currently on CPAP -Follows   6. Hypotension: -Had episode of hypotension while at [facility]  hospital -Was given IVF bolus with moderate response -Not currently on antihypertensive therapy>>on home minipress for PTSD which is currently on hold -Monitor closely for symptoms   7. Hypertriglyceridemia: -Trig, 380 -Needs further treatment with statin for cardiac risk -Will start atorvastatin  given mild ALT elevation at 51 -Needs repeat labs with LFT in 6 weeks     For questions or updates, please contact [facility] Please consult [email or web address] for contact info under Cardiology/STEMI.   Raliegh Ip NP-C [facility] Pager: [phone] 02/26/2019 9:53 AM  Attending Note:   The patient was seen and examined.  Agree with assessment and plan as noted above.  Changes made to the above note as needed.  Patient seen and independently examined with  Georgie Chard, NP .   We discussed all aspects of the encounter. I agree with the assessment and plan as stated above.  1   Respiratory  difficulty  Fever on monday of 101.4  Breathing has improved  No cough,  No fever in past couple days  2.  + Troponin:   : most recent level is 0.57 Has a constant chest pressure.   Has been there for 3-4 days. Not worsened with deep breath . Better when he leans forward ,  Worse when he lies back   Does not exercise  Works as a [employer] officer  Quit smoking Monday .   Symptoms are more c/w pericarditis  and are not c/w acute coronary syndrome   Echo to be done today     I have spent a total of 40 minutes with patient reviewing hospital  notes , telemetry, EKGs, labs and examining patient as well as establishing an assessment and plan that was discussed with the patient. > 50% of time was spent in direct patient care.    Vesta Mixer, Montez Hageman., MD, [facility] 02/26/2019, 1:26 PM [address]  [address] Office [phone] Pager [phone]

## 2019-02-27 DIAGNOSIS — R079 Chest pain, unspecified: Secondary | ICD-10-CM

## 2019-02-27 LAB — TROPONIN I: Troponin I: 0.1 ng/mL (ref <0.03)

## 2019-02-27 MED ORDER — IBUPROFEN 200 MG PO TABS
400.0000 mg | ORAL_TABLET | Freq: Three times a day (TID) | ORAL | Status: DC
  Administered 2019-02-27: 400 mg via ORAL
  Filled 2019-02-27: qty 2

## 2019-02-27 MED ORDER — MONTELUKAST SODIUM 10 MG PO TABS: 10.0000 mg | ORAL_TABLET | Freq: Every morning | ORAL | Status: AC

## 2019-02-27 MED ORDER — ONDANSETRON HCL 4 MG PO TABS: 4.0000 mg | ORAL_TABLET | Freq: Four times a day (QID) | ORAL | 0 refills | Status: DC | PRN

## 2019-02-27 MED ORDER — IBUPROFEN 400 MG PO TABS: 400.0000 mg | ORAL_TABLET | Freq: Three times a day (TID) | ORAL | 0 refills | Status: DC

## 2019-02-27 MED ORDER — PANTOPRAZOLE SODIUM 40 MG PO TBEC: 40.0000 mg | DELAYED_RELEASE_TABLET | Freq: Every day | ORAL | 0 refills | Status: DC

## 2019-02-27 MED ORDER — CEFUROXIME AXETIL 500 MG PO TABS: 500.0000 mg | ORAL_TABLET | Freq: Two times a day (BID) | ORAL | 0 refills | Status: AC

## 2019-02-27 MED ORDER — NITROGLYCERIN 0.4 MG SL SUBL: 0.4000 mg | SUBLINGUAL_TABLET | SUBLINGUAL | 0 refills | Status: DC | PRN

## 2019-02-27 NOTE — TOC Transition Note (Signed)
Transition of Care [facility]) - CM/SW Discharge Note   Patient Details  Name: Jason Sheppard MRN: [id number] Date of Birth: 1981/04/03  Transition of Care [facility] CM/SW Contact:  Leone Haven, RN Phone Number: 02/27/2019, 10:36 AM   Clinical Narrative:    From home with wife, CAP, covid negative, for dc today, he goes to [facility], provider is Dr. Jethro Poling, CSW is [place] Dom pager is [phone], phone [phone] ext [phone].  He has transportation at discharge, he states he can afford to get his medications, he does not need any assistance with that.  He made his follow up apt with Dr. Jethro Poling, he is just waiting to hear back from the [place]. He has no other needs.   Final next level of care: Home/Self Care Barriers to Discharge: No Barriers Identified   Patient Goals and CMS Choice Patient states their goals for this hospitalization and ongoing recovery are:: take it easy, go back to work on Tuesday   Choice offered to / list presented to : NA  Discharge Placement                       Discharge Plan and Services In-house Referral: NA Discharge Planning Services: CM Consult Post Acute Care Choice: NA          DME Arranged: N/A           HH Agency: NA        Social Determinants of Health (SDOH) Interventions     Readmission Risk Interventions Readmission Risk Prevention Plan 02/27/2019  Post Dischage Appt Complete  Medication Screening Complete  Transportation Screening Complete  Some recent data might be hidden

## 2019-02-27 NOTE — Discharge Summary (Signed)
Physician Discharge Summary  Jason Sheppard GNF:[id number] DOB: 1981/01/17 DOA: 02/24/2019  PCP: [facility]  Admit date: 02/24/2019 Discharge date: 02/27/2019  Admitted From: Home Disposition: Home  Recommendations for Outpatient Follow-up:  1. Follow up with PCP in 1 week with repeat CBC/BMP 2. Outpatient follow-up with cardiology 3. Follow up in ED if symptoms worsen or new appear   Home Health: No Equipment/Devices: None  Discharge Condition: Stable CODE STATUS: Full Diet recommendation: Heart healthy  Brief/Interim Summary: 38 year old male with history of HIV on antiretroviral therapy with undetectable viral load currently and followed by infectious disease at [facility], PTSD, migraine presented with cough and shortness of breath.  He was found to be febrile with potential COVID-19 exposure at workplace.  Chest x-ray showed patchy left basilar infiltrate.  COVID-19 test was negative.  He was found to have elevated troponin with chest pain.  Cardiology recommended transfer to [facility].  He was started on heparin drip.  Cardiology evaluated the patient and has cleared the patient for discharge.  His respiratory status has improved.  He will be discharged on oral antibiotics.  His repeat COVID-19 testing was negative again.   Discharge Diagnoses:  Active Problems:   CAP (community acquired pneumonia)  Community-acquired bacterial left lower lobar pneumonia with hypoxia -Treated with Rocephin and Zithromax.  Cultures have been negative so far. COVID-19 rapid testing has been negative x2 so far.  It is very unlikely that patient had COVID-19 infection. -Currently on room air. -Strep pneumo urinary antigen negative -Patient feels much better.  Will discharge on oral Ceftin for 5 more days.  Chest pain with mildly positive troponins -Currently chest pain-free.  Cardiology recommended transfer to [facility].  Started on heparin drip.  Troponin only  slightly elevated and did not trend upwards.  -Echo shows EF of 55 to 60%.  Cardiology evaluation appreciated.  Cardiology thinks the patient's chest pain is not consistent with an acute coronary syndrome and is most likely pleuritic.  He might have some degree of pericarditis as well.  Cardiology recommends ibuprofen 400 to 600 mg 3 times a day for the next 3 to 4 days.  Outpatient follow-up with cardiology.  HIV on antiretrovirals  -Continue antiretroviral.  Patient has undetectable viral load.  Outpatient follow-up with ID at [facility].   Discharge Instructions  Discharge Instructions    Ambulatory referral to Cardiology   Complete by:  As directed    Follow up for chest pain/?pericarditis   Call MD for:  difficulty breathing, headache or visual disturbances   Complete by:  As directed    Call MD for:  persistant nausea and vomiting   Complete by:  As directed    Call MD for:  severe uncontrolled pain   Complete by:  As directed    Diet - low sodium heart healthy   Complete by:  As directed    Increase activity slowly   Complete by:  As directed      Allergies as of 02/27/2019      Reactions   Sulfa Antibiotics Anaphylaxis   Iodine Rash   Latex Rash      Medication List    TAKE these medications   Biktarvy 50-200-25 MG Tabs tablet Generic drug:  bictegravir-emtricitabine-tenofovir AF Take 1 tablet by mouth daily.   cefUROXime 500 MG tablet Commonly known as:  CEFTIN Take 1 tablet (500 mg total) by mouth 2 (two) times daily for 5 days.   citalopram 10 MG tablet  Commonly known as:  CELEXA Take 10 mg by mouth daily.   ibuprofen 400 MG tablet Commonly known as:  ADVIL Take 1 tablet (400 mg total) by mouth 3 (three) times daily.   montelukast 10 MG tablet Commonly known as:  SINGULAIR Take 1 tablet (10 mg total) by mouth every morning.   nitroGLYCERIN 0.4 MG SL tablet Commonly known as:  NITROSTAT Place 1 tablet (0.4 mg total) under the tongue every 5 (five)  minutes as needed for chest pain.   ondansetron 4 MG tablet Commonly known as:  ZOFRAN Take 1 tablet (4 mg total) by mouth every 6 (six) hours as needed for nausea.   pantoprazole 40 MG tablet Commonly known as:  PROTONIX Take 1 tablet (40 mg total) by mouth daily.   prazosin 2 MG capsule Commonly known as:  MINIPRESS Take 2 mg by mouth at bedtime.      Follow-up Information    [facility] Schedule an appointment as soon as possible for a visit in 1 week(s).   Contact information: [address] [facility] [place] [address] [phone]          Allergies  Allergen Reactions  . Sulfa Antibiotics Anaphylaxis  . Iodine Rash  . Latex Rash    Consultations:  Cardiology   Procedures/Studies: Dg Chest Port 1 View  Result Date: 02/24/2019 CLINICAL DATA:  Shortness of breath and cough for 1 week, fevers EXAM: PORTABLE CHEST 1 VIEW COMPARISON:  None. FINDINGS: Cardiac shadows within normal limits. The lungs are well aerated bilaterally. Patchy infiltrate is noted in the left lung base. No sizable effusion is seen. No bony abnormality is noted. IMPRESSION: Patchy left basilar infiltrate. Electronically Signed   By: Alcide Clever M.D.   On: 02/24/2019 22:21    Echo IMPRESSIONS    1. The right ventricle has normal systolc function. The cavity was normal. There is no increase in right ventricular wall thickness.  2. No evidence of mitral valve stenosis. Trivial mitral regurgitation.  3. The aortic valve is tricuspid No stenosis of the aortic valve.  4. The inferior vena cava was dilated in size with <50% respiratory variability. No complete TR doppler jet so unable to estimate PA systolic pressure.  5. The left ventricle has normal systolic function, with an ejection fraction of 55-60%. The cavity size was normal. No evidence of left ventricular regional wall motion abnormalities.    Subjective: Patient seen and examined at bedside.  He feels  much better and denies any current chest pain.  He feels that he is ready to go home.  No overnight fever, nausea or vomiting.  Discharge Exam: Vitals:   02/27/19 0741 02/27/19 0800  BP: 112/76   Pulse: 68   Resp: 18 (!) 22  Temp: 97.7 F (36.5 C)   SpO2: 97%     General: Pt is alert, awake, not in acute distress Cardiovascular: rate controlled, S1/S2 + Respiratory: bilateral decreased breath sounds at bases with some left-sided crackles Abdominal: Soft, NT, ND, bowel sounds + Extremities: no edema, no cyanosis    The results of significant diagnostics from this hospitalization (including imaging, microbiology, ancillary and laboratory) are listed below for reference.     Microbiology: Recent Results (from the past 240 hour(s))  SARS Coronavirus 2 (CEPHEID- Performed in [facility] hospital lab), Hosp Order     Status: None   Collection Time: 02/25/19  2:00 AM  Result Value Ref Range Status   SARS Coronavirus 2 NEGATIVE NEGATIVE Final  Comment: (NOTE) If result is NEGATIVE SARS-CoV-2 target nucleic acids are NOT DETECTED. The SARS-CoV-2 RNA is generally detectable in upper and lower  respiratory specimens during the acute phase of infection. The lowest  concentration of SARS-CoV-2 viral copies this assay can detect is 250  copies / mL. A negative result does not preclude SARS-CoV-2 infection  and should not be used as the sole basis for treatment or other  patient management decisions.  A negative result may occur with  improper specimen collection / handling, submission of specimen other  than nasopharyngeal swab, presence of viral mutation(s) within the  areas targeted by this assay, and inadequate number of viral copies  (<250 copies / mL). A negative result must be combined with clinical  observations, patient history, and epidemiological information. If result is POSITIVE SARS-CoV-2 target nucleic acids are DETECTED. The SARS-CoV-2 RNA is generally detectable in  upper and lower  respiratory specimens dur ing the acute phase of infection.  Positive  results are indicative of active infection with SARS-CoV-2.  Clinical  correlation with patient history and other diagnostic information is  necessary to determine patient infection status.  Positive results do  not rule out bacterial infection or co-infection with other viruses. If result is PRESUMPTIVE POSTIVE SARS-CoV-2 nucleic acids MAY BE PRESENT.   A presumptive positive result was obtained on the submitted specimen  and confirmed on repeat testing.  While 2019 novel coronavirus  (SARS-CoV-2) nucleic acids may be present in the submitted sample  additional confirmatory testing may be necessary for epidemiological  and / or clinical management purposes  to differentiate between  SARS-CoV-2 and other Sarbecovirus currently known to infect humans.  If clinically indicated additional testing with an alternate test  methodology [phone]) is advised. The SARS-CoV-2 RNA is generally  detectable in upper and lower respiratory sp ecimens during the acute  phase of infection. The expected result is Negative. Fact Sheet for Patients:  BoilerBrush.com.cyhttps://www.fda.gov/media/136312/download Fact Sheet for Healthcare Providers: https://pope.com/https://www.fda.gov/media/136313/download This test is not yet approved or cleared by the [place] FDA and has been authorized for detection and/or diagnosis of SARS-CoV-2 by FDA under an Emergency Use Authorization (EUA).  This EUA will remain in effect (meaning this test can be used) for the duration of the COVID-19 declaration under Section 564(b)(1) of the Act, 21 U.S.C. section 360bbb-3(b)(1), unless the authorization is terminated or revoked sooner. Performed at [facility], [address] [place], [place] [id number]   Novel Coronavirus,NAA,(SEND-OUT TO REF LAB - TAT 24-48 hrs); Hosp Order     Status: None   Collection Time: 02/25/19  5:05 AM  Result Value Ref Range Status    SARS-CoV-2, NAA NOT DETECTED NOT DETECTED Final    Comment: (NOTE) This test was developed and its performance characteristics determined by [facility]. This test has not been FDA cleared or approved. This test has been authorized by FDA under an Emergency Use Authorization (EUA). This test is only authorized for the duration of time the declaration that circumstances exist justifying the authorization of the emergency use of in vitro diagnostic tests for detection of SARS-CoV-2 virus and/or diagnosis of COVID-19 infection under section 564(b)(1) of the Act, 21 U.S.C. 413KGM-0(N)(0360bbb-3(b)(1), unless the authorization is terminated or revoked sooner. When diagnostic testing is negative, the possibility of a false negative result should be considered in the context of a patient's recent exposures and the presence of clinical signs and symptoms consistent with COVID-19. An individual without symptoms of COVID-19 and who is not shedding  SARS-CoV-2 virus would expect to have a negative (not detected) result in this assay. Performed  At: [facility] [address] [place], [place] [id number] Jolene Schimke MD DG:[id number]    Coronavirus Source NASOPHARYNGEAL  Final    Comment: Performed at [facility], [address], [place], [place] [address]  Culture, blood (Routine X 2) w Reflex to ID Panel     Status: None (Preliminary result)   Collection Time: 02/25/19  5:50 AM  Result Value Ref Range Status   Specimen Description BLOOD LEFT ARM  Final   Special Requests   Final    BOTTLES DRAWN AEROBIC AND ANAEROBIC Blood Culture adequate volume   Culture   Final    NO GROWTH 2 DAYS Performed at [facility], [address], [place], [place] [address]    Report Status PENDING  Incomplete  Culture, blood (Routine X 2) w Reflex to ID Panel     Status: None (Preliminary result)   Collection Time: 02/25/19  5:54 AM  Result Value Ref Range Status   Specimen Description BLOOD RIGHT HAND   Final   Special Requests   Final    BOTTLES DRAWN AEROBIC AND ANAEROBIC Blood Culture adequate volume   Culture   Final    NO GROWTH 2 DAYS Performed at [facility], [address] [place], [place] [address]    Report Status PENDING  Incomplete  MRSA PCR Screening     Status: None   Collection Time: 02/26/19  3:45 AM  Result Value Ref Range Status   MRSA by PCR NEGATIVE NEGATIVE Final    Comment:        The GeneXpert MRSA Assay (FDA approved for NASAL specimens only), is one component of a comprehensive MRSA colonization surveillance program. It is not intended to diagnose MRSA infection nor to guide or monitor treatment for MRSA infections. Performed at [facility], [address] [place], [place] [address]   SARS Coronavirus 2 (CEPHEID- Performed in [facility] hospital lab), Hosp Order     Status: None   Collection Time: 02/26/19  9:02 AM  Result Value Ref Range Status   SARS Coronavirus 2 NEGATIVE NEGATIVE Final    Comment: (NOTE) If result is NEGATIVE SARS-CoV-2 target nucleic acids are NOT DETECTED. The SARS-CoV-2 RNA is generally detectable in upper and lower  respiratory specimens during the acute phase of infection. The lowest  concentration of SARS-CoV-2 viral copies this assay can detect is 250  copies / mL. A negative result does not preclude SARS-CoV-2 infection  and should not be used as the sole basis for treatment or other  patient management decisions.  A negative result may occur with  improper specimen collection / handling, submission of specimen other  than nasopharyngeal swab, presence of viral mutation(s) within the  areas targeted by this assay, and inadequate number of viral copies  (<250 copies / mL). A negative result must be combined with clinical  observations, patient history, and epidemiological information. If result is POSITIVE SARS-CoV-2 target nucleic acids are DETECTED. The SARS-CoV-2 RNA is generally detectable in upper and lower   respiratory specimens dur ing the acute phase of infection.  Positive  results are indicative of active infection with SARS-CoV-2.  Clinical  correlation with patient history and other diagnostic information is  necessary to determine patient infection status.  Positive results do  not rule out bacterial infection or co-infection with other viruses. If result is PRESUMPTIVE POSTIVE SARS-CoV-2 nucleic acids MAY BE PRESENT.  A presumptive positive result was obtained on the submitted specimen  and confirmed on repeat testing.  While 2019 novel coronavirus  (SARS-CoV-2) nucleic acids may be present in the submitted sample  additional confirmatory testing may be necessary for epidemiological  and / or clinical management purposes  to differentiate between  SARS-CoV-2 and other Sarbecovirus currently known to infect humans.  If clinically indicated additional testing with an alternate test  methodology [phone]) is advised. The SARS-CoV-2 RNA is generally  detectable in upper and lower respiratory sp ecimens during the acute  phase of infection. The expected result is Negative. Fact Sheet for Patients:  BoilerBrush.com.cy Fact Sheet for Healthcare Providers: https://pope.com/ This test is not yet approved or cleared by the [place] FDA and has been authorized for detection and/or diagnosis of SARS-CoV-2 by FDA under an Emergency Use Authorization (EUA).  This EUA will remain in effect (meaning this test can be used) for the duration of the COVID-19 declaration under Section 564(b)(1) of the Act, 21 U.S.C. section 360bbb-3(b)(1), unless the authorization is terminated or revoked sooner. Performed at [facility], [address] [place], [place] [address]      Labs: BNP (last 3 results) No results for input(s): BNP in the last 8760 hours. Basic Metabolic Panel: Recent Labs  Lab 02/24/19 2118 02/25/19 0551  NA 135 137   K 3.4* 4.0  CL 100 100  CO2 24 28  GLUCOSE 148* 131*  BUN 13 12  CREATININE 1.13 1.02  CALCIUM 8.8* 9.0   Liver Function Tests: Recent Labs  Lab 02/25/19 0551  AST 23  ALT 51*  ALKPHOS 57  BILITOT 1.1  PROT 7.8  ALBUMIN 4.3   No results for input(s): LIPASE, AMYLASE in the last 168 hours. No results for input(s): AMMONIA in the last 168 hours. CBC: Recent Labs  Lab 02/24/19 2118 02/25/19 0551 02/26/19 0854  WBC 9.4 8.9 6.4  NEUTROABS 6.0  --   --   HGB 14.8 15.4 14.0  HCT 41.7 44.8 39.1  MCV 86.7 87.8 86.7  PLT 248 241 222   Cardiac Enzymes: Recent Labs  Lab 02/25/19 1224 02/25/19 1810 02/26/19 1209 02/26/19 1613 02/26/19 2326  TROPONINI 0.08* 0.57* 0.11* 0.10* 0.10*   BNP: Invalid input(s): POCBNP CBG: No results for input(s): GLUCAP in the last 168 hours. D-Dimer No results for input(s): DDIMER in the last 72 hours. Hgb A1c Recent Labs    02/25/19 0836  HGBA1C 5.7*   Lipid Profile No results for input(s): CHOL, HDL, LDLCALC, TRIG, CHOLHDL, LDLDIRECT in the last 72 hours. Thyroid function studies Recent Labs    02/25/19 0836  TSH 1.922   Anemia work up No results for input(s): VITAMINB12, FOLATE, FERRITIN, TIBC, IRON, RETICCTPCT in the last 72 hours. Urinalysis    Component Value Date/Time   COLORURINE YELLOW 02/25/2019 0347   APPEARANCEUR CLEAR 02/25/2019 0347   LABSPEC 1.011 02/25/2019 0347   PHURINE 6.0 02/25/2019 0347   GLUCOSEU NEGATIVE 02/25/2019 0347   HGBUR NEGATIVE 02/25/2019 0347   BILIRUBINUR NEGATIVE 02/25/2019 0347   KETONESUR NEGATIVE 02/25/2019 0347   PROTEINUR NEGATIVE 02/25/2019 0347   NITRITE NEGATIVE 02/25/2019 0347   LEUKOCYTESUR NEGATIVE 02/25/2019 0347   Sepsis Labs Invalid input(s): PROCALCITONIN,  WBC,  LACTICIDVEN Microbiology Recent Results (from the past 240 hour(s))  SARS Coronavirus 2 (CEPHEID- Performed in [facility] hospital lab), Hosp Order     Status: None   Collection Time: 02/25/19  2:00 AM   Result Value Ref Range  Status   SARS Coronavirus 2 NEGATIVE NEGATIVE Final    Comment: (NOTE) If result is NEGATIVE SARS-CoV-2 target nucleic acids are NOT DETECTED. The SARS-CoV-2 RNA is generally detectable in upper and lower  respiratory specimens during the acute phase of infection. The lowest  concentration of SARS-CoV-2 viral copies this assay can detect is 250  copies / mL. A negative result does not preclude SARS-CoV-2 infection  and should not be used as the sole basis for treatment or other  patient management decisions.  A negative result may occur with  improper specimen collection / handling, submission of specimen other  than nasopharyngeal swab, presence of viral mutation(s) within the  areas targeted by this assay, and inadequate number of viral copies  (<250 copies / mL). A negative result must be combined with clinical  observations, patient history, and epidemiological information. If result is POSITIVE SARS-CoV-2 target nucleic acids are DETECTED. The SARS-CoV-2 RNA is generally detectable in upper and lower  respiratory specimens dur ing the acute phase of infection.  Positive  results are indicative of active infection with SARS-CoV-2.  Clinical  correlation with patient history and other diagnostic information is  necessary to determine patient infection status.  Positive results do  not rule out bacterial infection or co-infection with other viruses. If result is PRESUMPTIVE POSTIVE SARS-CoV-2 nucleic acids MAY BE PRESENT.   A presumptive positive result was obtained on the submitted specimen  and confirmed on repeat testing.  While 2019 novel coronavirus  (SARS-CoV-2) nucleic acids may be present in the submitted sample  additional confirmatory testing may be necessary for epidemiological  and / or clinical management purposes  to differentiate between  SARS-CoV-2 and other Sarbecovirus currently known to infect humans.  If clinically indicated additional  testing with an alternate test  methodology [phone]) is advised. The SARS-CoV-2 RNA is generally  detectable in upper and lower respiratory sp ecimens during the acute  phase of infection. The expected result is Negative. Fact Sheet for Patients:  BoilerBrush.com.cy Fact Sheet for Healthcare Providers: https://pope.com/ This test is not yet approved or cleared by the [place] FDA and has been authorized for detection and/or diagnosis of SARS-CoV-2 by FDA under an Emergency Use Authorization (EUA).  This EUA will remain in effect (meaning this test can be used) for the duration of the COVID-19 declaration under Section 564(b)(1) of the Act, 21 U.S.C. section 360bbb-3(b)(1), unless the authorization is terminated or revoked sooner. Performed at [facility], [address], [place], [place] [address]   Novel Coronavirus,NAA,(SEND-OUT TO REF LAB - TAT 24-48 hrs); Hosp Order     Status: None   Collection Time: 02/25/19  5:05 AM  Result Value Ref Range Status   SARS-CoV-2, NAA NOT DETECTED NOT DETECTED Final    Comment: (NOTE) This test was developed and its performance characteristics determined by [employer] [facility]. This test has not been FDA cleared or approved. This test has been authorized by FDA under an Emergency Use Authorization (EUA). This test is only authorized for the duration of time the declaration that circumstances exist justifying the authorization of the emergency use of in vitro diagnostic tests for detection of SARS-CoV-2 virus and/or diagnosis of COVID-19 infection under section 564(b)(1) of the Act, 21 U.S.C. 295MWU-1(L)(2), unless the authorization is terminated or revoked sooner. When diagnostic testing is negative, the possibility of a false negative result should be considered in the context of a patient's recent exposures and the presence of clinical signs and symptoms consistent with COVID-19.  An  individual without symptoms of COVID-19 and who is not shedding SARS-CoV-2 virus would expect to have a negative (not detected) result in this assay. Performed  At: [facility] [address] [place], [place] [id number] Jolene Schimke MD WU:[id number]    Coronavirus Source NASOPHARYNGEAL  Final    Comment: Performed at [facility], [address] [place], [place] [address]  Culture, blood (Routine X 2) w Reflex to ID Panel     Status: None (Preliminary result)   Collection Time: 02/25/19  5:50 AM  Result Value Ref Range Status   Specimen Description BLOOD LEFT ARM  Final   Special Requests   Final    BOTTLES DRAWN AEROBIC AND ANAEROBIC Blood Culture adequate volume   Culture   Final    NO GROWTH 2 DAYS Performed at [facility], [address] [place], [place] [address]    Report Status PENDING  Incomplete  Culture, blood (Routine X 2) w Reflex to ID Panel     Status: None (Preliminary result)   Collection Time: 02/25/19  5:54 AM  Result Value Ref Range Status   Specimen Description BLOOD RIGHT HAND  Final   Special Requests   Final    BOTTLES DRAWN AEROBIC AND ANAEROBIC Blood Culture adequate volume   Culture   Final    NO GROWTH 2 DAYS Performed at [facility], [address], [place], [place] [address]    Report Status PENDING  Incomplete  MRSA PCR Screening     Status: None   Collection Time: 02/26/19  3:45 AM  Result Value Ref Range Status   MRSA by PCR NEGATIVE NEGATIVE Final    Comment:        The GeneXpert MRSA Assay (FDA approved for NASAL specimens only), is one component of a comprehensive MRSA colonization surveillance program. It is not intended to diagnose MRSA infection nor to guide or monitor treatment for MRSA infections. Performed at [facility], [address], [place], [place] [address]   SARS Coronavirus 2 (CEPHEID- Performed in [facility] hospital lab), Hosp Order     Status: None   Collection Time: 02/26/19  9:02 AM  Result Value Ref  Range Status   SARS Coronavirus 2 NEGATIVE NEGATIVE Final    Comment: (NOTE) If result is NEGATIVE SARS-CoV-2 target nucleic acids are NOT DETECTED. The SARS-CoV-2 RNA is generally detectable in upper and lower  respiratory specimens during the acute phase of infection. The lowest  concentration of SARS-CoV-2 viral copies this assay can detect is 250  copies / mL. A negative result does not preclude SARS-CoV-2 infection  and should not be used as the sole basis for treatment or other  patient management decisions.  A negative result may occur with  improper specimen collection / handling, submission of specimen other  than nasopharyngeal swab, presence of viral mutation(s) within the  areas targeted by this assay, and inadequate number of viral copies  (<250 copies / mL). A negative result must be combined with clinical  observations, patient history, and epidemiological information. If result is POSITIVE SARS-CoV-2 target nucleic acids are DETECTED. The SARS-CoV-2 RNA is generally detectable in upper and lower  respiratory specimens dur ing the acute phase of infection.  Positive  results are indicative of active infection with SARS-CoV-2.  Clinical  correlation with patient history and other diagnostic information is  necessary to determine patient infection status.  Positive results do  not rule out bacterial infection or co-infection with other viruses. If result  is PRESUMPTIVE POSTIVE SARS-CoV-2 nucleic acids MAY BE PRESENT.   A presumptive positive result was obtained on the submitted specimen  and confirmed on repeat testing.  While 2019 novel coronavirus  (SARS-CoV-2) nucleic acids may be present in the submitted sample  additional confirmatory testing may be necessary for epidemiological  and / or clinical management purposes  to differentiate between  SARS-CoV-2 and other Sarbecovirus currently known to infect humans.  If clinically indicated additional testing with an  alternate test  methodology [phone]) is advised. The SARS-CoV-2 RNA is generally  detectable in upper and lower respiratory sp ecimens during the acute  phase of infection. The expected result is Negative. Fact Sheet for Patients:  BoilerBrush.com.cy Fact Sheet for Healthcare Providers: https://pope.com/ This test is not yet approved or cleared by the [place] FDA and has been authorized for detection and/or diagnosis of SARS-CoV-2 by FDA under an Emergency Use Authorization (EUA).  This EUA will remain in effect (meaning this test can be used) for the duration of the COVID-19 declaration under Section 564(b)(1) of the Act, 21 U.S.C. section 360bbb-3(b)(1), unless the authorization is terminated or revoked sooner. Performed at [facility], [address], [place], [place] [address]      Time coordinating discharge: 35 minutes  SIGNED:   Glade Lloyd, MD  [facility] 02/27/2019, 9:24 AM

## 2019-02-27 NOTE — Progress Notes (Signed)
Patient verbalize concerns for discharge home.  Patient NPO since midnight pending possible procedure.  Remains on Heparin drip, dose titrated.  Stable condition at shift change, will continue to monitor.

## 2019-02-27 NOTE — Discharge Instructions (Signed)
YOUR CARDIOLOGY TEAM WILL CONTACT YOU TO ARRANGE AN E-VISIT FOR YOUR FOLLOW-UP APPOINTMENT - PLEASE SEE MORE INFORMATION ABOUT E-VISITS BELOW:  Due to the recent COVID-19 pandemic, we are transitioning in-person office visits to tele-medicine visits in an effort to decrease unnecessary exposure to our patients, their families, and staff. These visits are billed to your insurance just like a normal visit is. We also encourage you to sign up for MyChart if you have not already done so. You will need a smartphone if possible. For patients that do not have this, we can still complete the visit using a regular telephone but do prefer a smartphone to enable video when possible. You may have a family member that lives with you that can help. If possible, we also ask that you have a blood pressure cuff and scale at home to measure your blood pressure, heart rate and weight prior to your scheduled appointment. Patients with clinical needs that need an in-person evaluation and testing will still be able to come to the office if absolutely necessary. If you have any questions, feel free to call our office.     YOUR PROVIDER WILL BE USING ONE OF THE FOLLOWING PLATFORM TO COMPLETE YOUR VISIT:  MyChart/Doximity/Doxy.Me (our office will contact you with more information)   IF USING MYCHART - How to Download the MyChart App to Your SmartPhone   - If Apple, go to [employer] and type in MyChart in the search bar and download the app. If Android, ask patient to go to [employer] and type in [place] in the search bar and download the app. The app is free but as with any other app downloads, your phone may require you to verify saved payment information or Apple/Android password.  - You will need to then log into the app with your MyChart username and password, and select [facility] as your healthcare provider to link the account.  - When it is time for your visit, go to the MyChart app, find appointments, and click  Begin Video Visit. Be sure to Select Allow for your device to access the Microphone and Camera for your visit. You will then be connected, and your provider will be with you shortly.  **If you have any issues connecting or need assistance, please contact MyChart service desk [phone])**  **If using a computer, in order to ensure the best quality for your visit, you will need to use either of the following Internet Browsers: Agricultural consultant or Microsoft Edge**   IF USING DOXIMITY or DOXY.ME - The staff will give you instructions on receiving your link to join the meeting the day of your visit.      2-3 DAYS BEFORE YOUR APPOINTMENT  You will receive a telephone call from one of our [facility] team members - your caller ID may say "Unknown caller." If this is a video visit, we will walk you through how to get the video launched on your phone. We will remind you check your blood pressure, heart rate and weight prior to your scheduled appointment. If you have an Apple Watch or Kardia, please upload any pertinent ECG strips the day before or morning of your appointment to MyChart. Our staff will also make sure you have reviewed the consent and agree to move forward with your scheduled tele-health visit.     THE DAY OF YOUR APPOINTMENT  Approximately 15 minutes prior to your scheduled appointment, you will receive a telephone call from one of HeartCare  team - your caller ID may say "Unknown caller."  Our staff will confirm medications, vital signs for the day and any symptoms you may be experiencing. Please have this information available prior to the time of visit start. It may also be helpful for you to have a pad of paper and pen handy for any instructions given during your visit. They will also walk you through joining the smartphone meeting if this is a video visit.    CONSENT FOR TELE-HEALTH VISIT - PLEASE REVIEW  I hereby voluntarily request, consent and authorize [facility] and its employed or contracted physicians, physician assistants, nurse practitioners or other licensed health care professionals (the Practitioner), to provide me with telemedicine health care services (the Services") as deemed necessary by the treating Practitioner. I acknowledge and consent to receive the Services by the Practitioner via telemedicine. I understand that the telemedicine visit will involve communicating with the Practitioner through live audiovisual communication technology and the disclosure of certain medical information by electronic transmission. I acknowledge that I have been given the opportunity to request an in-person assessment or other available alternative prior to the telemedicine visit and am voluntarily participating in the telemedicine visit.  I understand that I have the right to withhold or withdraw my consent to the use of telemedicine in the course of my care at any time, without affecting my right to future care or treatment, and that the Practitioner or I may terminate the telemedicine visit at any time. I understand that I have the right to inspect all information obtained and/or recorded in the course of the telemedicine visit and may receive copies of available information for a reasonable fee.  I understand that some of the potential risks of receiving the Services via telemedicine include:   Delay or interruption in medical evaluation due to technological equipment failure or disruption;  Information transmitted may not be sufficient (e.g. poor resolution of images) to allow for appropriate medical decision making by the Practitioner; and/or   In rare instances, security protocols could fail, causing a breach of personal health information.  Furthermore, I acknowledge that it is my responsibility to provide information about my medical history, conditions and care that is complete and accurate to the best of my ability. I acknowledge that Practitioner's  advice, recommendations, and/or decision may be based on factors not within their control, such as incomplete or inaccurate data provided by me or distortions of diagnostic images or specimens that may result from electronic transmissions. I understand that the practice of medicine is not an exact science and that Practitioner makes no warranties or guarantees regarding treatment outcomes. I acknowledge that I will receive a copy of this consent concurrently upon execution via email to the email address I last provided but may also request a printed copy by calling the office of [facility].    I understand that my insurance will be billed for this visit.   I have read or had this consent read to me.  I understand the contents of this consent, which adequately explains the benefits and risks of the Services being provided via telemedicine.   I have been provided ample opportunity to ask questions regarding this consent and the Services and have had my questions answered to my satisfaction.  I give my informed consent for the services to be provided through the use of telemedicine in my medical care  By participating in this telemedicine visit I agree to the above.

## 2019-02-27 NOTE — Progress Notes (Signed)
Progress Note  Patient Name: Jason Sheppard Date of Encounter: 02/27/2019  Primary Cardiologist:  Luvenia Heller   Subjective   Jason Sheppard is a 38 year old gentleman who was admitted with pneumonia.  He reported having some chest discomfort.  The chest pain has been constant for days.  There was a pleuritic component.  His troponin levels were fairly low and the trend has been flat.  The trend is not consistent with an acute coronary syndrome.  He is feeling much better on antibiotics.  He has no EKG changes.  Inpatient Medications    Scheduled Meds: . aspirin  81 mg Oral Daily  . atorvastatin  40 mg Oral q1800  . bictegravir-emtricitabine-tenofovir AF  1 tablet Oral Daily  . citalopram  10 mg Oral Daily  . ibuprofen  400 mg Oral TID  . montelukast  10 mg Oral q morning - 10a  . nicotine  21 mg Transdermal Daily  . pantoprazole  40 mg Oral Daily   Continuous Infusions: . azithromycin Stopped (02/26/19 1147)  . cefTRIAXone (ROCEPHIN)  IV Stopped (02/26/19 0954)   PRN Meds: acetaminophen **OR** acetaminophen, nitroGLYCERIN, ondansetron **OR** ondansetron (ZOFRAN) IV   Vital Signs    Vitals:   02/26/19 1207 02/26/19 1720 02/26/19 2300 02/27/19 0741  BP: 116/66 127/77 115/73 112/76  Pulse: 77 78 61 68  Resp:  17 18 18   Temp: 98.2 F (36.8 C) 98.3 F (36.8 C) 98.2 F (36.8 C) 97.7 F (36.5 C)  TempSrc: Oral Oral Oral Oral  SpO2: 98% 98% 97% 97%  Weight:      Height:        Intake/Output Summary (Last 24 hours) at 02/27/2019 0914 Last data filed at 02/27/2019 0800 Gross per 24 hour  Intake 1451.19 ml  Output -  Net 1451.19 ml   Last 3 Weights 02/26/2019 02/26/2019 02/25/2019  Weight (lbs) 186 lb 8.2 oz 184 lb 11.9 oz 187 lb 9.8 oz  Weight (kg) 84.6 kg 83.8 kg 85.1 kg      Telemetry     NSR  - Personally Reviewed  ECG     NSR , no ST or T wave changes - Personally Reviewed  Physical Exam   GEN: No acute distress.   Neck: No JVD Cardiac: RRR, no murmurs, no   rubs, or gallops.  Respiratory: Clear to auscultation bilaterally. GI: Soft, nontender, non-distended  MS: No edema; No deformity. Neuro:  Nonfocal  Psych: Normal affect   Labs    Chemistry Recent Labs  Lab 02/24/19 2118 02/25/19 0551  NA 135 137  K 3.4* 4.0  CL 100 100  CO2 24 28  GLUCOSE 148* 131*  BUN 13 12  CREATININE 1.13 1.02  CALCIUM 8.8* 9.0  PROT  --  7.8  ALBUMIN  --  4.3  AST  --  23  ALT  --  51*  ALKPHOS  --  57  BILITOT  --  1.1  GFRNONAA >60 >60  GFRAA >60 >60  ANIONGAP 11 9     Hematology Recent Labs  Lab 02/24/19 2118 02/25/19 0551 02/26/19 0854  WBC 9.4 8.9 6.4  RBC 4.81 5.10 4.51  HGB 14.8 15.4 14.0  HCT 41.7 44.8 39.1  MCV 86.7 87.8 86.7  MCH 30.8 30.2 31.0  MCHC 35.5 34.4 35.8  RDW 12.4 12.2 12.1  PLT 248 241 222    Cardiac Enzymes Recent Labs  Lab 02/25/19 1810 02/26/19 1209 02/26/19 1613 02/26/19 2326  TROPONINI 0.57* 0.11* 0.10* 0.10*  No results for input(s): TROPIPOC in the last 168 hours.   BNPNo results for input(s): BNP, PROBNP in the last 168 hours.   DDimer No results for input(s): DDIMER in the last 168 hours.   Radiology    No results found.  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  Chest pain: His symptoms were not consistent with with an acute coronary syndrome.  His chest pain was more pleuritic.  He was admitted with pneumonia and it is quite likely that he had some degree of pericarditis as well as pleuritis.  His symptoms have improved remarkably on antibiotic therapy.  He has no rub on exam.  His troponin levels are minimally elevated with a flat trend.  This suggest that he may have some mild myocarditis.  We will send him home on ibuprofen 400 mg to 600 mg 3 times a day for the next 3 to 4 days.  He may return to work on Monday from a cardiac standpoint.  I will see him in the office in several months for  follow-up visit. [facility] will sign off.   Medication Recommendations:   Other recommendations (labs, testing, etc):   Follow up as an outpatient:  With Dr. Elease HashimotoNahser or APP in 1-2 months   For questions or updates, please contact [facility] Please consult [email or web address] for contact info under        Signed, Kristeen MissPhilip Russia Scheiderer, MD  02/27/2019, 9:14 AM

## 2019-03-02 LAB — CULTURE, BLOOD (ROUTINE X 2)
Culture: NO GROWTH
Culture: NO GROWTH
Special Requests: ADEQUATE
Special Requests: ADEQUATE

## 2019-03-20 ENCOUNTER — Telehealth: Payer: Self-pay

## 2019-03-20 NOTE — Telephone Encounter (Signed)
YOUR CARDIOLOGY TEAM HAS ARRANGED FOR AN E-VISIT FOR YOUR APPOINTMENT - PLEASE REVIEW IMPORTANT INFORMATION BELOW SEVERAL DAYS PRIOR TO YOUR APPOINTMENT  Due to the recent COVID-19 pandemic, we are transitioning in-person office visits to tele-medicine visits in an effort to decrease unnecessary exposure to our patients, their families, and staff. These visits are billed to your insurance just like a normal visit is. We also encourage you to sign up for MyChart if you have not already done so. You will need a smartphone if possible. For patients that do not have this, we can still complete the visit using a regular telephone but do prefer a smartphone to enable video when possible. You may have a family member that lives with you that can help. If possible, we also ask that you have a blood pressure cuff and scale at home to measure your blood pressure, heart rate and weight prior to your scheduled appointment. Patients with clinical needs that need an in-person evaluation and testing will still be able to come to the office if absolutely necessary. If you have any questions, feel free to call our office.     YOUR PROVIDER WILL BE USING THE FOLLOWING PLATFORM TO COMPLETE YOUR VISIT: Doximity  . IF USING MYCHART - How to Download the MyChart App to Your SmartPhone   - If Apple, go to [employer] and type in MyChart in the search bar and download the app. If Android, ask patient to go to [employer] and type in MyChart in the search bar and download the app. The app is free but as with any other app downloads, your phone may require you to verify saved payment information or Apple/Android password.  - You will need to then log into the app with your MyChart username and password, and select [place] as your healthcare provider to link the account.  - When it is time for your visit, go to the MyChart app, find appointments, and click Begin Video Visit. Be sure to Select Allow for your device to  access the Microphone and Camera for your visit. You will then be connected, and your provider will be with you shortly.  **If you have any issues connecting or need assistance, please contact MyChart service desk [phone] ([phone])**  **If using a computer, in order to ensure the best quality for your visit, you will need to use either of the following Internet Browsers: Google Chrome or Microsoft Edge**  . IF USING DOXIMITY or DOXY.ME - The staff will give you instructions on receiving your link to join the meeting the day of your visit.      2-3 DAYS BEFORE YOUR APPOINTMENT  You will receive a telephone call from one of our HeartCare team members - your caller ID may say "Unknown caller." If this is a video visit, we will walk you through how to get the video launched on your phone. We will remind you check your blood pressure, heart rate and weight prior to your scheduled appointment. If you have an Apple Watch or Kardia, please upload any pertinent ECG strips the day before or morning of your appointment to MyChart. Our staff will also make sure you have reviewed the consent and agree to move forward with your scheduled tele-health visit.     THE DAY OF YOUR APPOINTMENT  Approximately 15 minutes prior to your scheduled appointment, you will receive a telephone call from one of HeartCare team - your caller ID may say "Unknown caller."    Our staff will confirm medications, vital signs for the day and any symptoms you may be experiencing. Please have this information available prior to the time of visit start. It may also be helpful for you to have a pad of paper and pen handy for any instructions given during your visit. They will also walk you through joining the smartphone meeting if this is a video visit.    CONSENT FOR TELE-HEALTH VISIT - PLEASE REVIEW  I hereby voluntarily request, consent and authorize [facility] and its employed or contracted physicians, physician  assistants, nurse practitioners or other licensed health care professionals (the Practitioner), to provide me with telemedicine health care services (the "Services") as deemed necessary by the treating Practitioner. I acknowledge and consent to receive the Services by the Practitioner via telemedicine. I understand that the telemedicine visit will involve communicating with the Practitioner through live audiovisual communication technology and the disclosure of certain medical information by electronic transmission. I acknowledge that I have been given the opportunity to request an in-person assessment or other available alternative prior to the telemedicine visit and am voluntarily participating in the telemedicine visit.  I understand that I have the right to withhold or withdraw my consent to the use of telemedicine in the course of my care at any time, without affecting my right to future care or treatment, and that the Practitioner or I may terminate the telemedicine visit at any time. I understand that I have the right to inspect all information obtained and/or recorded in the course of the telemedicine visit and may receive copies of available information for a reasonable fee.  I understand that some of the potential risks of receiving the Services via telemedicine include:  . Delay or interruption in medical evaluation due to technological equipment failure or disruption; . Information transmitted may not be sufficient (e.g. poor resolution of images) to allow for appropriate medical decision making by the Practitioner; and/or  . In rare instances, security protocols could fail, causing a breach of personal health information.  Furthermore, I acknowledge that it is my responsibility to provide information about my medical history, conditions and care that is complete and accurate to the best of my ability. I acknowledge that Practitioner's advice, recommendations, and/or decision may be based on  factors not within their control, such as incomplete or inaccurate data provided by me or distortions of diagnostic images or specimens that may result from electronic transmissions. I understand that the practice of medicine is not an exact science and that Practitioner makes no warranties or guarantees regarding treatment outcomes. I acknowledge that I will receive a copy of this consent concurrently upon execution via email to the email address I last provided but may also request a printed copy by calling the office of [facility].    I understand that my insurance will be billed for this visit.   I have read or had this consent read to me. . I understand the contents of this consent, which adequately explains the benefits and risks of the Services being provided via telemedicine.  . I have been provided ample opportunity to ask questions regarding this consent and the Services and have had my questions answered to my satisfaction. . I give my informed consent for the services to be provided through the use of telemedicine in my medical care  By participating in this telemedicine visit I agree to the above.  

## 2019-03-24 ENCOUNTER — Encounter: Payer: Self-pay | Admitting: Cardiology

## 2019-03-24 ENCOUNTER — Telehealth (INDEPENDENT_AMBULATORY_CARE_PROVIDER_SITE_OTHER): Payer: Self-pay | Admitting: Cardiology

## 2019-03-24 ENCOUNTER — Other Ambulatory Visit: Payer: Self-pay

## 2019-03-24 VITALS — Ht 69.0 in | Wt 190.0 lb

## 2019-03-24 DIAGNOSIS — I309 Acute pericarditis, unspecified: Secondary | ICD-10-CM

## 2019-03-24 DIAGNOSIS — I319 Disease of pericardium, unspecified: Secondary | ICD-10-CM | POA: Insufficient documentation

## 2019-03-24 DIAGNOSIS — Z87891 Personal history of nicotine dependence: Secondary | ICD-10-CM

## 2019-03-24 DIAGNOSIS — G4733 Obstructive sleep apnea (adult) (pediatric): Secondary | ICD-10-CM

## 2019-03-24 NOTE — Patient Instructions (Addendum)
Follow up with cardiology at the [facility] as planned for tomorrow. If you have any testing done, please have records forwarded to our office. Fax number is [phone]  Lifestyle Modifications to Prevent and Treat Heart Disease -Recommend heart healthy/Mediterranean diet, with whole grains, fruits, vegetable, fish, lean meats, nuts, and olive oil.  -Limit salt. -Recommend moderate walking, 3-5 times/week for 30-50 minutes each session. Aim for at least 150 minutes.week. Goal should be pace of 3 miles/hours, or walking 1.5 miles in 30 minutes -Recommend avoidance of tobacco products. Avoid excess alcohol. -Keep blood pressure well controlled, ideally less than 130/80.  -Try to use CPAP consistently

## 2019-03-24 NOTE — Progress Notes (Signed)
Virtual Visit via Video Note   This visit type was conducted due to national recommendations for restrictions regarding the COVID-19 Pandemic (e.g. social distancing) in an effort to limit this patient's exposure and mitigate transmission in our community.  Due to his co-morbid illnesses, this patient is at least at moderate risk for complications without adequate follow up.  This format is felt to be most appropriate for this patient at this time.  All issues noted in this document were discussed and addressed.  A limited physical exam was performed with this format.  Please refer to the patient's chart for his consent to telehealth for [facility]   Date:  03/24/2019   ID:  Jason Sheppard, DOB [birth year], MRN [id number]  Patient Location: Home Provider Location: Home  PCP:  Clinic, [facility]  Cardiologist:  Kristeen Miss, MD  Electrophysiologist:  None   Evaluation Performed:  Follow-Up Visit  Chief Complaint:  Hospital follow up for chest pain  History of Present Illness:    Jason Sheppard is a 38 y.o. male with  a hx of HIV with undetectable viral load (followed by ID at [facility]), PTSD, OSA, depression, hx of tobacco (quit 2007) use and migraines who was seen in the hospital in consult for chest pain on 02/26/19. He has family history significant for premature CAD in his father at age 67 and mother who died of massive MI at age 34.   His chest pain was a constant pressure that was present for several days, worse with certain positions. His Troponin was elevated at 0.57. EKG with no acute abnormalities. The patient had fever and exposure to persons who tested positive for COVID 19, however, he tested negative at this hospitalization. Triglycerides were found to be elevated at 380. Echo showed normal EF and no RWMA.   Symptoms were more consistent with pericarditis as well as pleuritis and not with ACS. He was treated for pneumonia and given antibiotics by the primary team and felt  much better. He was discharged on ibuprofen 400 mg -600 mg TID for 3-4 days.   Jason Sheppard is being seen today via virtual visit for hospital follow up. He was initially driving so I had him pull over to a safe lace to conduct the visit.  He still has some chest discomfort and shortness of breath when he takes trash to the street and when he picks his grandson up out of his playpen. He is concerned that his chest pain has not fully resolved. He was [facility] and [facility].   He has custody of 3 grandchildren from 44 months to 62 years old.   He has had a stress in [place] around 2014 or 2015. He discussed his chest pain with his PCP at the [facility] yesterday and a stress test was discussed.   The patient does not have symptoms concerning for COVID-19 infection (fever, chills, cough, or new shortness of breath).    Past Medical History:  Diagnosis Date  . Allergy   . Attention deficit disorder with hyperactivity   . Depression   . HIV (human immunodeficiency virus infection) (HCC)   . Migraine   . OSA (obstructive sleep apnea)   . Plantar fascial fibromatosis   . PTSD (post-traumatic stress disorder)    Past Surgical History:  Procedure Laterality Date  . HAND SURGERY    . shrapnel removal       Current  Meds  Medication Sig  . bictegravir-emtricitabine-tenofovir AF (BIKTARVY) 50-200-25 MG TABS tablet Take 1 tablet by mouth daily.  . citalopram (CELEXA) 10 MG tablet Take 10 mg by mouth daily.  Marland Kitchen ibuprofen (ADVIL) 400 MG tablet Take 1 tablet (400 mg total) by mouth 3 (three) times daily.  . montelukast (SINGULAIR) 10 MG tablet Take 1 tablet (10 mg total) by mouth every morning.  . nitroGLYCERIN (NITROSTAT) 0.4 MG SL tablet Place 1 tablet (0.4 mg total) under the tongue every 5 (five) minutes as needed for chest pain.  Marland Kitchen ondansetron (ZOFRAN) 4 MG tablet Take 1 tablet (4 mg total) by mouth every 6 (six) hours as needed for nausea.   . pantoprazole (PROTONIX) 40 MG tablet Take 1 tablet (40 mg total) by mouth daily.  . prazosin (MINIPRESS) 2 MG capsule Take 2 mg by mouth at bedtime.     Allergies:   Sulfa antibiotics; Iodine; and Latex   Social History   Tobacco Use  . Smoking status: Current Every Day Smoker    Packs/day: 1.00    Types: Cigarettes    Last attempt to quit: 10/23/2005    Years since quitting: 13.4  . Smokeless tobacco: Never Used  Substance Use Topics  . Alcohol use: No    Alcohol/week: 0.0 standard drinks  . Drug use: No     Family Hx: The patient's family history includes Diabetes in his father and mother; Heart disease in his father and mother; Hyperlipidemia in his father; Hypertension in his mother.  ROS:   Please see the history of present illness.     All other systems reviewed and are negative.   Prior CV studies:   The following studies were reviewed today:  Echocardiogram 02/26/2019 IMPRESSIONS  1. The right ventricle has normal systolc function. The cavity was normal. There is no increase in right ventricular wall thickness.  2. No evidence of mitral valve stenosis. Trivial mitral regurgitation.  3. The aortic valve is tricuspid No stenosis of the aortic valve.  4. The inferior vena cava was dilated in size with <50% respiratory variability. No complete TR doppler jet so unable to estimate PA systolic pressure.  5. The left ventricle has normal systolic function, with an ejection fraction of 55-60%. The cavity size was normal. No evidence of left ventricular regional wall motion abnormalities.  FINDINGS  Left Ventricle: The left ventricle has normal systolic function, with an ejection fraction of 55-60%. The cavity size was normal. There is no increase in left ventricular wall thickness. No evidence of left ventricular regional wall motion  abnormalities.  Labs/Other Tests and Data Reviewed:    EKG:  No ECG reviewed.  Recent Labs: 02/25/2019: ALT 51; BUN 12; Creatinine, Ser  1.02; Potassium 4.0; Sodium 137; TSH 1.922 02/26/2019: Hemoglobin 14.0; Platelets 222   Recent Lipid Panel Lab Results  Component Value Date/Time   CHOL 202 (H) 05/09/2017 03:44 PM   TRIG 380 (H) 05/09/2017 03:44 PM   HDL 31 (L) 05/09/2017 03:44 PM   CHOLHDL 6.5 (H) 05/09/2017 03:44 PM   LDLCALC 95 05/09/2017 03:44 PM    Wt Readings from Last 3 Encounters:  03/24/19 190 lb (86.2 kg)  02/26/19 186 lb 8.2 oz (84.6 kg)  05/23/17 174 lb (78.9 kg)     Objective:    Vital Signs:  Ht 5\' 9"  (1.753 m)   Wt 190 lb (86.2 kg)   BMI 28.06 kg/m    VITAL SIGNS:  reviewed GEN:  no acute distress EYES:  sclerae anicteric, EOMI - Extraocular Movements Intact RESPIRATORY:  normal respiratory effort, symmetric expansion NEURO:  alert and oriented x 3, no obvious focal deficit PSYCH:  normal affect  ASSESSMENT & PLAN:    1. Chest pain -Recently treated for pericarditis and/or pleuritis in setting of hospitalization and treatment for pneumonia. Only mildly improved with ibuprofen and rest. He still has some chest pain and shortness of breath with activity and picking up his grandson.  -Could be continued pericarditis pain but he has CVD risk factors including premature family hx of CAD and smoking. We discussed a trial of colchicine as well as probably a good idea to check stress test for risk stratification. [facility] and feels like they will do a stress test later this week. [facility] as he can pick it up while he is there. He will call me tomorrow to let me know how that visit goes and I have asked him to have any cardiac study records forwarded to our office. He agrees.  -[facility].  2. OSA -He has CPAP but only uses it occasionally.  -Pt advised to try to use it regularly.   3. Tobacco use -Smokes 1PPD, down to 1/2 PPD.  -Just started Chantix a week ago.  -We discussed the hazards of  smoking including increased risk for heart attack and stroke. He will work on complete cessation.   COVID-19 Education: The signs and symptoms of COVID-19 were discussed with the patient and how to seek care for testing (follow up with PCP or arrange E-visit).  The importance of social distancing was discussed today.  Time:   Today, I have spent 19 minutes with the patient with telehealth technology discussing the above problems.     Medication Adjustments/Labs and Tests Ordered: Current medicines are reviewed at length with the patient today.  Concerns regarding medicines are outlined above.   Tests Ordered: No orders of the defined types were placed in this encounter.   Medication Changes: No orders of the defined types were placed in this encounter.   Disposition:  Follow up in 3 month(s)  Signed, Berton BonJanine Cinda Hara, NP  03/24/2019 10:24 AM    [facility]

## 2019-06-23 ENCOUNTER — Other Ambulatory Visit: Payer: Self-pay

## 2019-06-23 ENCOUNTER — Encounter (HOSPITAL_COMMUNITY): Payer: Self-pay

## 2019-06-23 ENCOUNTER — Encounter (HOSPITAL_COMMUNITY)
Admission: RE | Admit: 2019-06-23 | Discharge: 2019-06-23 | Disposition: A | Discharge status: Home / Self Care | Payer: No Typology Code available for payment source | Source: Ambulatory Visit | Attending: Internal Medicine | Admitting: Internal Medicine

## 2019-06-23 VITALS — BP 130/90 | HR 71 | Temp 98.4°F | Ht 68.0 in | Wt 188.1 lb

## 2019-06-23 DIAGNOSIS — I301 Infective pericarditis: Secondary | ICD-10-CM | POA: Diagnosis present

## 2019-06-23 HISTORY — DX: Infective pericarditis: I30.1

## 2019-06-23 HISTORY — DX: Epilepsy, unspecified, not intractable, without status epilepticus: G40.909

## 2019-06-23 NOTE — Progress Notes (Signed)
Daily Session Note  Patient Details  Name: Jason Sheppard MRN: [id number] Date of Birth: 1981/06/06 Referring Provider:     [place] from confidential encounter on 06/23/2019  Referring Provider  [place]      Encounter Date: 06/23/2019  Check In: Session Check In - 06/23/19 1440      Check-In   Supervising physician immediately available to respond to emergencies  See telemetry face sheet for immediately available MD    Location  [facility]    Staff Present  Russella Dar, MS, EP, [facility], Exercise Physiologist;Debra Wynetta Emery, RN, [facility] Munch, Exercise Physiologist    Virtual Visit  No    Medication changes reported      No    Fall or balance concerns reported     No    Tobacco Cessation  Use Increase   Patient started back smoking in 2017   Warm-up and Cool-down  Performed as group-led instruction    Resistance Training Performed  Yes    VAD Patient?  No    PAD/SET Patient?  No      Pain Assessment   Currently in Pain?  No/denies    Pain Score  0-No pain    Multiple Pain Sites  No       Capillary Blood Glucose: No results found for this or any previous visit (from the past 24 hour(s)).  Exercise Prescription Changes - 06/23/19 1300      Response to Exercise   Blood Pressure (Admit)  130/90    Blood Pressure (Exercise)  152/84    Blood Pressure (Exit)  132/88    Heart Rate (Admit)  71 bpm    Heart Rate (Exercise)  85 bpm    Heart Rate (Exit)  77 bpm    Oxygen Saturation (Admit)  97 %    Oxygen Saturation (Exercise)  97 %    Oxygen Saturation (Exit)  98 %    Rating of Perceived Exertion (Exercise)  13    Perceived Dyspnea (Exercise)  14    Symptoms  SOB    Comments  6MWT    Duration  Progress to 30 minutes of  aerobic without signs/symptoms of physical distress    Intensity  THRR New   [id number]      Social History   Tobacco Use  Smoking Status Current Every Day Smoker  . Packs/day: 1.00  . Years: 14.00  .  Pack years: 14.00  . Types: Cigarettes  . Last attempt to quit: 10/23/2005  . Years since quitting: 13.6  Smokeless Tobacco Never Used    Goals Met:  Improved SOB with ADL's Exercise tolerated well Personal goals reviewed No report of cardiac concerns or symptoms Strength training completed today  Goals Unmet:  Not Applicable  Comments: Check out 1400   Dr. Kate Sable is Medical Director for [facility] and [facility].

## 2019-06-23 NOTE — Progress Notes (Signed)
Cardiac/Pulmonary Rehab Medication Review by a Pharmacist  Does the patient  feel that his/her medications are working for him/her?  yes  Has the patient been experiencing any side effects to the medications prescribed?  Yes- keppra caused "rage." switching to oxcarbazepine   Does the patient measure his/her own blood pressure or blood glucose at home?  yes   Does the patient have any problems obtaining medications due to transportation or finances?   no  Understanding of regimen: good Understanding of indications: good Potential of compliance: good  Questions asked to Determine Patient Understanding of Medication Regimen:  1. What is the name of the medication?  2. What is the medication used for?  3. When should it be taken?  4. How much should be taken?  5. How will you take it?  6. What side effects should you report?  Understanding Defined as: Excellent: All questions above are correct Good: Questions 1-4 are correct Fair: Questions 1-2 are correct  Poor: 1 or none of the above questions are correct   Pharmacist comments: Overall, patient has good understanding of regiment and high compliance.  Had issues with keppra, doctor is currently tapering off and starting oxcarbazepine.    Ramond Craver 06/23/2019 1:54 PM

## 2019-06-23 NOTE — Progress Notes (Signed)
Cardiac Individual Treatment Plan  Patient Details  Name: Jason Sheppard MRN: [id number] Date of Birth: 1981-02-26 Referring Provider:     CARDIAC REHAB PHASE II ORIENTATION from confidential encounter on 06/23/2019  Referring Provider  [place]      Initial Encounter Date:    CARDIAC REHAB PHASE II ORIENTATION from confidential encounter on 06/23/2019  Date  06/23/19      Visit Diagnosis: Infectious pericarditis, unspecified chronicity, unspecified infectious etiology  Patient's Home Medications on Admission:  Current Outpatient Medications:  .  colchicine 0.6 MG tablet, Take 0.6 mg by mouth 2 (two) times daily with a meal. , Disp: , Rfl:  .  hydrOXYzine (VISTARIL) 25 MG capsule, Take 25 mg by mouth every 6 (six) hours as needed (extreme anxiety)., Disp: , Rfl:  .  levETIRAcetam (KEPPRA) 250 MG tablet, Take 125 mg by mouth 2 (two) times daily. Tapering off keppra while initiating oxcarbazepine. Currently at 1/2 tab twice daily, Disp: , Rfl:  .  lisinopril (ZESTRIL) 5 MG tablet, Take 5 mg by mouth daily., Disp: , Rfl:  .  OXcarbazepine (TRILEPTAL) 150 MG tablet, Take 300 mg by mouth 2 (two) times daily. End goal of 300 mg BID. Patient currently working on increasing over a month with dose increase every week, Disp: , Rfl:  .  bictegravir-emtricitabine-tenofovir AF (BIKTARVY) 50-200-25 MG TABS tablet, Take 1 tablet by mouth daily., Disp: , Rfl:  .  citalopram (CELEXA) 10 MG tablet, Take 20 mg by mouth daily. , Disp: , Rfl:  .  ibuprofen (ADVIL) 400 MG tablet, Take 1 tablet (400 mg total) by mouth 3 (three) times daily., Disp: 15 tablet, Rfl: 0 .  montelukast (SINGULAIR) 10 MG tablet, Take 1 tablet (10 mg total) by mouth every morning., Disp: , Rfl:  .  nitroGLYCERIN (NITROSTAT) 0.4 MG SL tablet, Place 1 tablet (0.4 mg total) under the tongue every 5 (five) minutes as needed for chest pain., Disp: 30 tablet, Rfl: 0 .  ondansetron (ZOFRAN) 4 MG tablet, Take 1 tablet (4 mg total) by mouth  every 6 (six) hours as needed for nausea. (Patient not taking: Reported on 06/23/2019), Disp: 20 tablet, Rfl: 0 .  pantoprazole (PROTONIX) 40 MG tablet, Take 1 tablet (40 mg total) by mouth daily., Disp: 5 tablet, Rfl: 0 .  prazosin (MINIPRESS) 2 MG capsule, Take 2 mg by mouth at bedtime., Disp: , Rfl:   Past Medical History: Past Medical History:  Diagnosis Date  . Allergy   . Attention deficit disorder with hyperactivity   . Depression   . HIV (human immunodeficiency virus infection) (HCC)   . Infective pericarditis   . Migraine   . OSA (obstructive sleep apnea)   . Plantar fascial fibromatosis   . PTSD (post-traumatic stress disorder)   . Seizure disorder (HCC)     Tobacco Use: Social History   Tobacco Use  Smoking Status Current Every Day Smoker  . Packs/day: 1.00  . Years: 14.00  . Pack years: 14.00  . Types: Cigarettes  . Last attempt to quit: 10/23/2005  . Years since quitting: 13.6  Smokeless Tobacco Never Used    Labs: Recent Review Flowsheet Data    Labs for ITP Cardiac and Pulmonary Rehab Latest Ref Rng & Units 12/07/2015 05/23/2016 11/28/2016 05/09/2017 02/25/2019   Cholestrol <200 mg/dL 086(V) 784 696 295(M) -   LDLCALC <100 mg/dL 841(L) 87 75 95 -   HDL >40 mg/dL 24(M) 01(U) 27(O) 53(G) -   Trlycerides <150 mg/dL 644(I) 347(Q) 259(D)  380(H) -   Hemoglobin A1c 4.8 - 5.6 % - - - - 5.7(H)      Capillary Blood Glucose: No results found for: GLUCAP   Exercise Target Goals: Exercise Program Goal: Individual exercise prescription set using results from initial 6 min walk test and THRR while considering  patient's activity barriers and safety.   Exercise Prescription Goal: Starting with aerobic activity 30 plus minutes a day, 3 days per week for initial exercise prescription. Provide home exercise prescription and guidelines that participant acknowledges understanding prior to discharge.  Activity Barriers & Risk Stratification: Activity Barriers & Cardiac Risk  Stratification - 06/23/19 1337      Activity Barriers & Cardiac Risk Stratification   Activity Barriers  Back Problems;Shortness of Breath;Muscular Weakness    Cardiac Risk Stratification  High       6 Minute Walk: 6 Minute Walk    Row Name 06/23/19 1336         6 Minute Walk   Phase  Initial     Distance  1200 feet     Walk Time  6 minutes     # of Rest Breaks  0     MPH  2.27     METS  2.74     RPE  13     Perceived Dyspnea   14     VO2 Peak  15.05     Symptoms  No     Resting HR  71 bpm     Resting BP  130/90     Resting Oxygen Saturation   97 %     Exercise Oxygen Saturation  during 6 min walk  97 %     Max Ex. HR  85 bpm     Max Ex. BP  152/84     2 Minute Post BP  132/88        Oxygen Initial Assessment:   Oxygen Re-Evaluation:   Oxygen Discharge (Final Oxygen Re-Evaluation):   Initial Exercise Prescription: Initial Exercise Prescription - 06/23/19 1300      Date of Initial Exercise RX and Referring Provider   Date  06/23/19    Referring Provider  [place]    Expected Discharge Date  09/22/19      Treadmill   MPH  2    Grade  0    Minutes  17    METs  2.53      NuStep   Level  1    SPM  80    Minutes  17    METs  1.5      Prescription Details   Frequency (times per week)  3    Duration  Progress to 30 minutes of continuous aerobic without signs/symptoms of physical distress      Intensity   THRR 40-80% of Max Heartrate  [phone]    Ratings of Perceived Exertion  11-15    Perceived Dyspnea  0-4      Progression   Progression  Continue to progress workloads to maintain intensity without signs/symptoms of physical distress.      Resistance Training   Training Prescription  Yes    Weight  1    Reps  10-15       Perform Capillary Blood Glucose checks as needed.  Exercise Prescription Changes:  Exercise Prescription Changes    Row Name 06/23/19 1300             Response to Exercise   Blood Pressure (Admit)  130/90  Blood Pressure (Exercise)  152/84       Blood Pressure (Exit)  132/88       Heart Rate (Admit)  71 bpm       Heart Rate (Exercise)  85 bpm       Heart Rate (Exit)  77 bpm       Oxygen Saturation (Admit)  97 %       Oxygen Saturation (Exercise)  97 %       Oxygen Saturation (Exit)  98 %       Rating of Perceived Exertion (Exercise)  13       Perceived Dyspnea (Exercise)  14       Symptoms  SOB       Comments        Duration  Progress to 30 minutes of  aerobic without signs/symptoms of physical distress       Intensity  THRR New [phone]          Exercise Comments:   Exercise Goals and Review:  Exercise Goals    Row Name 06/23/19 1340             Exercise Goals   Increase Physical Activity  Yes       Intervention  Provide advice, education, support and counseling about physical activity/exercise needs.;Develop an individualized exercise prescription for aerobic and resistive training based on initial evaluation findings, risk stratification, comorbidities and participant's personal goals.       Expected Outcomes  Short Term: Attend rehab on a regular basis to increase amount of physical activity.;Long Term: Add in home exercise to make exercise part of routine and to increase amount of physical activity.;Long Term: Exercising regularly at least 3-5 days a week.       Increase Strength and Stamina  Yes       Intervention  Provide advice, education, support and counseling about physical activity/exercise needs.;Develop an individualized exercise prescription for aerobic and resistive training based on initial evaluation findings, risk stratification, comorbidities and participant's personal goals.       Expected Outcomes  Short Term: Increase workloads from initial exercise prescription for resistance, speed, and METs.;Short Term: Perform resistance training exercises routinely during rehab and add in resistance training at home;Long Term: Improve cardiorespiratory fitness,  muscular endurance and strength as measured by increased METs and functional capacity ( )       Able to understand and use rate of perceived exertion (RPE) scale  Yes       Intervention  Provide education and explanation on how to use RPE scale       Expected Outcomes  Short Term: Able to use RPE daily in rehab to express subjective intensity level;Long Term:  Able to use RPE to guide intensity level when exercising independently       Able to understand and use Dyspnea scale  Yes       Intervention  Provide education and explanation on how to use Dyspnea scale       Expected Outcomes  Short Term: Able to use Dyspnea scale daily in rehab to express subjective sense of shortness of breath during exertion;Long Term: Able to use Dyspnea scale to guide intensity level when exercising independently       Knowledge and understanding of Target Heart Rate Range (THRR)  Yes       Intervention  Provide education and explanation of THRR including how the numbers were predicted and where they are located for reference  Expected Outcomes  Short Term: Able to state/look up THRR;Long Term: Able to use THRR to govern intensity when exercising independently;Short Term: Able to use daily as guideline for intensity in rehab       Able to check pulse independently  Yes       Intervention  Provide education and demonstration on how to check pulse in carotid and radial arteries.;Review the importance of being able to check your own pulse for safety during independent exercise       Expected Outcomes  Short Term: Able to explain why pulse checking is important during independent exercise;Long Term: Able to check pulse independently and accurately       Understanding of Exercise Prescription  Yes       Intervention  Provide education, explanation, and written materials on patient's individual exercise prescription       Expected Outcomes  Short Term: Able to explain program exercise prescription;Long Term: Able to  explain home exercise prescription to exercise independently          Exercise Goals Re-Evaluation :    Discharge Exercise Prescription (Final Exercise Prescription Changes): Exercise Prescription Changes - 06/23/19 1300      Response to Exercise   Blood Pressure (Admit)  130/90    Blood Pressure (Exercise)  152/84    Blood Pressure (Exit)  132/88    Heart Rate (Admit)  71 bpm    Heart Rate (Exercise)  85 bpm    Heart Rate (Exit)  77 bpm    Oxygen Saturation (Admit)  97 %    Oxygen Saturation (Exercise)  97 %    Oxygen Saturation (Exit)  98 %    Rating of Perceived Exertion (Exercise)  13    Perceived Dyspnea (Exercise)  14    Symptoms  SOB    Comments     Duration  Progress to 30 minutes of  aerobic without signs/symptoms of physical distress    Intensity  THRR New   [phone]      Nutrition:  Target Goals: Understanding of nutrition guidelines, daily intake of sodium 1500mg , cholesterol 200mg , calories 30% from fat and 7% or less from saturated fats, daily to have 5 or more servings of fruits and vegetables.  Biometrics: Pre Biometrics - 06/23/19 1340      Pre Biometrics   Height  5\' 8"  (1.727 m)    Waist Circumference  37.5 inches    Hip Circumference  39 inches    Waist to Hip Ratio  0.96 %    Triceps Skinfold  6 mm    % Body Fat  22.1 %    Grip Strength  2.3 kg   kg   Flexibility  0 in   lower back pain due to cartilage issues and fusion.   Single Leg Stand  60 seconds        Nutrition Therapy Plan and Nutrition Goals: Nutrition Therapy & Goals - 06/23/19 1453      Nutrition Therapy   RD appointment deferred  Yes      Personal Nutrition Goals   Personal Goal #2  Patient is eating a heart healthy diet already. Handouts discussing more heart healthy choices was given.    Additional Goals?  No      Intervention Plan   Intervention  Nutrition handout(s) given to patient.    Expected Outcomes  Short Term Goal: Understand basic principles of  dietary content, such as calories, fat, sodium, cholesterol and nutrients.  Nutrition Assessments: Nutrition Assessments - 06/23/19 1454      MEDFICTS Scores   Pre Score  42       Nutrition Goals Re-Evaluation:   Nutrition Goals Discharge (Final Nutrition Goals Re-Evaluation):   Psychosocial: Target Goals: Acknowledge presence or absence of significant depression and/or stress, maximize coping skills, provide positive support system. Participant is able to verbalize types and ability to use techniques and skills needed for reducing stress and depression.  Initial Review & Psychosocial Screening: Initial Psych Review & Screening - 06/23/19 1452      Initial Review   Current issues with  History of Depression;Current Anxiety/Panic;Current Stress Concerns    Source of Stress Concerns  None Identified    Comments  PTSD      Family Dynamics   Good Support System?  Yes      Barriers   Psychosocial barriers to participate in program  The patient should benefit from training in stress management and relaxation.      Screening Interventions   Interventions  Encouraged to exercise    Expected Outcomes  Short Term goal: Identification and review with participant of any Quality of Life or Depression concerns found by scoring the questionnaire.;Long Term goal: The participant improves quality of Life and PHQ9 Scores as seen by post scores and/or verbalization of changes       Quality of Life Scores: Quality of Life - 06/23/19 1341      Quality of Life   Select  Quality of Life      Quality of Life Scores   Health/Function Pre  10.1 %    Socioeconomic Pre  19.38 %    Psych/Spiritual Pre  18.79 %    Family Pre  12 %    GLOBAL Pre  14.23 %      Scores of 19 and below usually indicate a poorer quality of life in these areas.  A difference of  2-3 points is a clinically meaningful difference.  A difference of 2-3 points in the total score of the Quality of Life Index has  been associated with significant improvement in overall quality of life, self-image, physical symptoms, and general health in studies assessing change in quality of life.  PHQ-9: Recent Review Flowsheet Data    [facility] 06/23/2019 06/06/2016 12/16/2015 11/29/2015   Decreased Interest 2 0 0 0   Down, Depressed, Hopeless 1 0 0 0   PHQ - 2 Score 3 0 0 0   Altered sleeping 2 - - -   Tired, decreased energy 3 - - -   Change in appetite 1 - - -   Feeling bad or failure about yourself  2 - - -   Trouble concentrating 2 - - -   Moving slowly or fidgety/restless 3 - - -   Suicidal thoughts 2 - - -   PHQ-9 Score 18 - - -   Difficult doing work/chores Extremely dIfficult - - -     Interpretation of Total Score  Total Score Depression Severity:  1-4 = Minimal depression, 5-9 = Mild depression, 10-14 = Moderate depression, 15-19 = Moderately severe depression, 20-27 = Severe depression   Psychosocial Evaluation and Intervention: Psychosocial Evaluation - 06/23/19 1453      Psychosocial Evaluation & Interventions   Interventions  Encouraged to exercise with the program and follow exercise prescription    Continue Psychosocial Services   Follow up required by staff       Psychosocial Re-Evaluation:  Psychosocial Discharge (Final Psychosocial Re-Evaluation):   Vocational Rehabilitation: Provide vocational rehab assistance to qualifying candidates.   Vocational Rehab Evaluation & Intervention: Vocational Rehab - 06/23/19 1455      Initial Vocational Rehab Evaluation & Intervention   Assessment shows need for Vocational Rehabilitation  No       Education: Education Goals: Education classes will be provided on a weekly basis, covering required topics. Participant will state understanding/return demonstration of topics presented.  Learning Barriers/Preferences: Learning Barriers/Preferences - 06/23/19 1455      Learning Barriers/Preferences   Learning Barriers  None     Learning Preferences  Video;Written Material;Verbal Instruction;Individual Instruction;Group Instruction       Education Topics: Hypertension, Hypertension Reduction -Define heart disease and high blood pressure. Discus how high blood pressure affects the body and ways to reduce high blood pressure.   Exercise and Your Heart -Discuss why it is important to exercise, the FITT principles of exercise, normal and abnormal responses to exercise, and how to exercise safely.   Angina -Discuss definition of angina, causes of angina, treatment of angina, and how to decrease risk of having angina.   Cardiac Medications -Review what the following cardiac medications are used for, how they affect the body, and side effects that may occur when taking the medications.  Medications include Aspirin, Beta blockers, calcium channel blockers, ACE Inhibitors, angiotensin receptor blockers, diuretics, digoxin, and antihyperlipidemics.   Congestive Heart Failure -Discuss the definition of CHF, how to live with CHF, the signs and symptoms of CHF, and how keep track of weight and sodium intake.   Heart Disease and Intimacy -Discus the effect sexual activity has on the heart, how changes occur during intimacy as we age, and safety during sexual activity.   Smoking Cessation / COPD -Discuss different methods to quit smoking, the health benefits of quitting smoking, and the definition of COPD.   Nutrition I: Fats -Discuss the types of cholesterol, what cholesterol does to the heart, and how cholesterol levels can be controlled.   Nutrition II: Labels -Discuss the different components of food labels and how to read food label   Heart Parts/Heart Disease and PAD -Discuss the anatomy of the heart, the pathway of blood circulation through the heart, and these are affected by heart disease.   Stress I: Signs and Symptoms -Discuss the causes of stress, how stress may lead to anxiety and depression,  and ways to limit stress.   Stress II: Relaxation -Discuss different types of relaxation techniques to limit stress.   Warning Signs of Stroke / TIA -Discuss definition of a stroke, what the signs and symptoms are of a stroke, and how to identify when someone is having stroke.   Knowledge Questionnaire Score: Knowledge Questionnaire Score - 06/23/19 1455      Knowledge Questionnaire Score   Pre Score  24/28       Core Components/Risk Factors/Patient Goals at Admission: Personal Goals and Risk Factors at Admission - 06/23/19 1455      Core Components/Risk Factors/Patient Goals on Admission    Weight Management  Weight Maintenance    Personal Goal Other  Yes    Personal Goal  GEt rid of SOB, Be able to do things easier, do more things with grandchildren.    Intervention  Attend CR 3 x times week and supplement with at home exercise 2 x week.    Expected Outcomes  Reach expected goals.       Core Components/Risk Factors/Patient Goals Review:    Core  Components/Risk Factors/Patient Goals at Discharge (Final Review):    ITP Comments: ITP Comments    Row Name 06/23/19 1443           ITP Comments  Patient has PTSD. His triggers are loud noises, people behind him, not having his back to a wall, un eased when people enter the room with out introduction. He is otherwise mild temperment.          Comments: Patient arrived for 1st visit/orientation/education at 1230. Patient was referred to CR by Dr. Luvenia Starch from [facility] due to Infectious Pericarditis (I30.1). During orientation advised patient on arrival and appointment times what to wear, what to do before, during and after exercise. Reviewed attendance and class policy. Talked about inclement weather and class consultation policy. Pt is scheduled to return Cardiac Rehab on 06/25/19 at 1430. Pt was advised to come to class 15 minutes before class starts. Patient was also given instructions on meeting with the dietician and  attending the Family Structure classes. Discussed RPE/Dpysnea scales. Discussed initial THR and how to find their radial and/or carotid pulse. Discussed the initial exercise prescription and how this effects their progress. Pt is eager to get started. Patient participated in warm up stretches followed by light weights and resistance bands. Patient was able to complete 6 minute walk test. Patient did not c/o pain during walk test. Patient was measured for the equipment. Discussed equipment safety with patient. Took patient pre-anthropometric measurements. Patient finished visit at 1400.

## 2019-06-25 ENCOUNTER — Encounter (HOSPITAL_COMMUNITY)
Admission: RE | Admit: 2019-06-25 | Discharge: 2019-06-25 | Disposition: A | Discharge status: Home / Self Care | Payer: No Typology Code available for payment source | Source: Ambulatory Visit | Attending: Internal Medicine | Admitting: Internal Medicine

## 2019-06-25 ENCOUNTER — Other Ambulatory Visit: Payer: Self-pay

## 2019-06-25 DIAGNOSIS — I301 Infective pericarditis: Secondary | ICD-10-CM | POA: Insufficient documentation

## 2019-06-25 NOTE — Progress Notes (Signed)
Daily Session Note  Patient Details  Name: MARSHA HILLMAN MRN: [id number] Date of Birth: 06-16-1981 Referring Provider:     [place] from confidential encounter on 06/23/2019  Referring Provider  [place]      Encounter Date: 06/25/2019  Check In: Session Check In - 06/25/19 1430      Check-In   Supervising physician immediately available to respond to emergencies  See telemetry face sheet for immediately available MD    Location  [facility]    Staff Present  Russella Dar, MS, EP, [facility], Exercise Physiologist;Debra Wynetta Emery, RN, [facility] Munch, Exercise Physiologist    Virtual Visit  No    Medication changes reported      No    Fall or balance concerns reported     No    Tobacco Cessation  Use Increase   Due to PTSD   Warm-up and Cool-down  Performed as group-led instruction    Resistance Training Performed  Yes    VAD Patient?  No    PAD/SET Patient?  No      Pain Assessment   Currently in Pain?  No/denies    Pain Score  0-No pain    Multiple Pain Sites  No       Capillary Blood Glucose: No results found for this or any previous visit (from the past 24 hour(s)).    Social History   Tobacco Use  Smoking Status Current Every Day Smoker  . Packs/day: 1.00  . Years: 14.00  . Pack years: 14.00  . Types: Cigarettes  . Last attempt to quit: 10/23/2005  . Years since quitting: 13.6  Smokeless Tobacco Never Used    Goals Met:  Independence with exercise equipment Exercise tolerated well Personal goals reviewed No report of cardiac concerns or symptoms Strength training completed today  Goals Unmet:  Not Applicable  Comments: Check out: 1530   Dr. Kate Sable is Medical Director for [place] and Pulmonary Rehab.

## 2019-06-27 ENCOUNTER — Encounter (HOSPITAL_COMMUNITY): Payer: No Typology Code available for payment source

## 2019-06-30 ENCOUNTER — Encounter (HOSPITAL_COMMUNITY): Payer: No Typology Code available for payment source

## 2019-07-02 ENCOUNTER — Other Ambulatory Visit: Payer: Self-pay

## 2019-07-02 ENCOUNTER — Encounter (HOSPITAL_COMMUNITY)
Admission: RE | Admit: 2019-07-02 | Discharge: 2019-07-02 | Disposition: A | Discharge status: Home / Self Care | Payer: No Typology Code available for payment source | Source: Ambulatory Visit | Attending: Internal Medicine | Admitting: Internal Medicine

## 2019-07-02 DIAGNOSIS — I301 Infective pericarditis: Secondary | ICD-10-CM

## 2019-07-02 NOTE — Progress Notes (Signed)
Daily Session Note  Patient Details  Name: Jason Sheppard MRN: [id number] Date of Birth: 03/14/1981 Referring Provider:     CARDIAC REHAB PHASE II ORIENTATION from 06/23/2019 in [facility]  Referring Provider  [place]      Encounter Date: 07/02/2019  Check In: Session Check In - 07/02/19 1430      Check-In   Supervising physician immediately available to respond to emergencies  See telemetry face sheet for immediately available MD    Location  [facility]    Staff Present   , Exercise Physiologist;Debra Johnson, RN, BSN    Virtual Visit  No    Medication changes reported      No    Fall or balance concerns reported     No    Tobacco Cessation  No Change    Warm-up and Cool-down  Performed as group-led instruction    Resistance Training Performed  Yes    VAD Patient?  No    PAD/SET Patient?  No      Pain Assessment   Currently in Pain?  No/denies    Pain Score  0-No pain    Multiple Pain Sites  No       Capillary Blood Glucose: No results found for this or any previous visit (from the past 24 hour(s)).    Social History   Tobacco Use  Smoking Status Current Every Day Smoker  . Packs/day: 1.00  . Years: 14.00  . Pack years: 14.00  . Types: Cigarettes  . Last attempt to quit: 10/23/2005  . Years since quitting: 13.6  Smokeless Tobacco Never Used    Goals Met:  Independence with exercise equipment Exercise tolerated well No report of cardiac concerns or symptoms Strength training completed today  Goals Unmet:  Not Applicable  Comments: Pt able to follow exercise prescription today without complaint.  Will continue to monitor for progression. Check out 1530.   Dr. Suresh Koneswaran is Medical Director for [facility]. 

## 2019-07-04 ENCOUNTER — Other Ambulatory Visit: Payer: Self-pay

## 2019-07-04 ENCOUNTER — Encounter (HOSPITAL_COMMUNITY)
Admission: RE | Admit: 2019-07-04 | Discharge: 2019-07-04 | Disposition: A | Discharge status: Home / Self Care | Payer: No Typology Code available for payment source | Source: Ambulatory Visit | Attending: Internal Medicine | Admitting: Internal Medicine

## 2019-07-04 DIAGNOSIS — I301 Infective pericarditis: Secondary | ICD-10-CM | POA: Diagnosis not present

## 2019-07-04 NOTE — Progress Notes (Signed)
Daily Session Note  Patient Details  Name: BRAZOS SANDOVAL MRN: [id number] Date of Birth: 13-Jun-1981 Referring Provider:     [place] from 06/23/2019 in [place]  Referring Provider  [place]      Encounter Date: 07/04/2019  Check In: Session Check In - 07/04/19 1430      Check-In   Supervising physician immediately available to respond to emergencies  See telemetry face sheet for immediately available ER MD    Location  [facility]    Staff Present  Benay Pike, Exercise Physiologist;M[place]ikeen Hehn Wynetta Emery, RN, BSN    Virtual Visit  No    Medication changes reported      No    Fall or balance concerns reported     No    Tobacco Cessation  No Change    Warm-up and Cool-down  Performed as group-led instruction    Resistance Training Performed  Yes    VAD Patient?  No    PAD/SET Patient?  No      Pain Assessment   Currently in Pain?  No/denies    Pain Score  0-No pain    Multiple Pain Sites  No       Capillary Blood Glucose: No results found for this or any previous visit (from the past 24 hour(s)).    Social History   Tobacco Use  Smoking Status Current Every Day Smoker  . Packs/day: 1.00  . Years: 14.00  . Pack years: 14.00  . Types: Cigarettes  . Last attempt to quit: 10/23/2005  . Years since quitting: 13.7  Smokeless Tobacco Never Used    Goals Met:  Independence with exercise equipment Exercise tolerated well No report of cardiac concerns or symptoms Strength training completed today  Goals Unmet:  Not Applicable  Comments: Pt able to follow exercise prescription today without complaint.  Will continue to monitor for progression. Check out 1430.   Dr. Kate Sable is Medical Director for [facility] Card[facility] and Pulmonary Rehab.

## 2019-07-07 ENCOUNTER — Other Ambulatory Visit: Payer: Self-pay

## 2019-07-07 ENCOUNTER — Encounter (HOSPITAL_COMMUNITY)
Admission: RE | Admit: 2019-07-07 | Discharge: 2019-07-07 | Disposition: A | Discharge status: Home / Self Care | Payer: No Typology Code available for payment source | Source: Ambulatory Visit | Attending: Internal Medicine | Admitting: Internal Medicine

## 2019-07-07 DIAGNOSIS — I301 Infective pericarditis: Secondary | ICD-10-CM | POA: Diagnosis not present

## 2019-07-07 NOTE — Progress Notes (Signed)
Daily Session Note  Patient Details  Name: Jason Sheppard MRN: [id number] Date of Birth: December 20, 1980 Referring Provider:     [place] from 06/23/2019 in [place]  Referring Provider  [place]      Encounter Date: 07/07/2019  Check In: Session Check In - 07/07/19 1424      Check-In   Supervising physician immediately available to respond to emergencies  See telemetry face sheet for immediately available ER MD    Location  [facility]    Staff Present  Benay Pike, Exercise Physiologist;[place] Fanfan Wynetta Emery, RN, BSN    Virtual Visit  No    Medication changes reported      No    Fall or balance concerns reported     No    Tobacco Cessation  No Change    Warm-up and Cool-down  Performed as group-led instruction    Resistance Training Performed  Yes    VAD Patient?  No    PAD/SET Patient?  No      Pain Assessment   Currently in Pain?  No/denies    Pain Score  0-No pain    Multiple Pain Sites  No       Capillary Blood Glucose: No results found for this or any previous visit (from the past 24 hour(s)).    Social History   Tobacco Use  Smoking Status Current Every Day Smoker  . Packs/day: 1.00  . Years: 14.00  . Pack years: 14.00  . Types: Cigarettes  . Last attempt to quit: 10/23/2005  . Years since quitting: 13.7  Smokeless Tobacco Never Used    Goals Met:  Independence with exercise equipment Exercise tolerated well No report of cardiac concerns or symptoms Strength training completed today  Goals Unmet:  Not Applicable  Comments: Pt able to follow exercise prescription today without complaint.  Will continue to monitor for progression. Check out 1330.   Dr. Kate Sable is Medical Director for [facility] Cardiac and Pulmonary Rehab.

## 2019-07-09 ENCOUNTER — Encounter (HOSPITAL_COMMUNITY): Payer: No Typology Code available for payment source

## 2019-07-11 ENCOUNTER — Encounter (HOSPITAL_COMMUNITY)
Admission: RE | Admit: 2019-07-11 | Discharge: 2019-07-11 | Disposition: A | Discharge status: Home / Self Care | Payer: No Typology Code available for payment source | Source: Ambulatory Visit | Attending: Internal Medicine | Admitting: Internal Medicine

## 2019-07-11 ENCOUNTER — Other Ambulatory Visit: Payer: Self-pay

## 2019-07-11 DIAGNOSIS — I301 Infective pericarditis: Secondary | ICD-10-CM | POA: Diagnosis not present

## 2019-07-11 NOTE — Progress Notes (Signed)
Daily Session Note  Patient Details  Name: Jason Sheppard MRN: [id number] Date of Birth: October 17, 1981 Referring Provider:     [place] from 06/23/2019 in [place]  Referring Provider  [place]      Encounter Date: 07/11/2019  Check In: Session Check In - 07/11/19 1430      Check-In   Supervising physician immediately available to respond to emergencies  See telemetry face sheet for immediately available ER MD    Location  [facility]    Staff Present  Benay Pike, Exercise Physiologist;Lorraine Terriquez Wynetta Emery, RN, BSN;Diane Coad, MS, EP, [facility], Exercise Physiologist    Virtual Visit  No    Medication changes reported      No    Fall or balance concerns reported     No    Tobacco Cessation  No Change    Warm-up and Cool-down  Performed as group-led instruction    Resistance Training Performed  Yes    VAD Patient?  No    PAD/SET Patient?  No      Pain Assessment   Currently in Pain?  No/denies    Pain Score  0-No pain    Multiple Pain Sites  No       Capillary Blood Glucose: No results found for this or any previous visit (from the past 24 hour(s)).    Social History   Tobacco Use  Smoking Status Current Every Day Smoker  . Packs/day: 1.00  . Years: 14.00  . Pack years: 14.00  . Types: Cigarettes  . Last attempt to quit: 10/23/2005  . Years since quitting: 13.7  Smokeless Tobacco Never Used    Goals Met:  Independence with exercise equipment Exercise tolerated well No report of cardiac concerns or symptoms Strength training completed today  Goals Unmet:  Not Applicable  Comments: Pt able to follow exercise prescription today without complaint.  Will continue to monitor for progression. Check out 1530.   Dr. Kate Sable is Medical Director for [facility] Card[facility] and Pulmonary Rehab.

## 2019-07-14 ENCOUNTER — Encounter (HOSPITAL_COMMUNITY)
Admission: RE | Admit: 2019-07-14 | Discharge: 2019-07-14 | Disposition: A | Discharge status: Home / Self Care | Payer: No Typology Code available for payment source | Source: Ambulatory Visit | Attending: Internal Medicine | Admitting: Internal Medicine

## 2019-07-14 ENCOUNTER — Other Ambulatory Visit: Payer: Self-pay

## 2019-07-14 DIAGNOSIS — I301 Infective pericarditis: Secondary | ICD-10-CM

## 2019-07-14 NOTE — Progress Notes (Signed)
Daily Session Note  Patient Details  Name: Jason Sheppard MRN: [id number] Date of Birth: November 27, 1980 Referring Provider:     [place] from 06/23/2019 in [place]  Referring Provider  [place]      Encounter Date: 07/14/2019  Check In: Session Check In - 07/14/19 1430      Check-In   Supervising physician immediately available to respond to emergencies  See telemetry face sheet for immediately available MD    Location  [facility]    Staff Present  Russella Dar, MS, EP, [facility], Exercise Physiologist;Aikeem Lilley Zachery Conch, Exercise Physiologist    Virtual Visit  No    Medication changes reported      No    Fall or balance concerns reported     No    Tobacco Cessation  No Change    Warm-up and Cool-down  Performed as group-led instruction    Resistance Training Performed  Yes    VAD Patient?  No    PAD/SET Patient?  No      Pain Assessment   Currently in Pain?  No/denies    Pain Score  0-No pain    Multiple Pain Sites  No       Capillary Blood Glucose: No results found for this or any previous visit (from the past 24 hour(s)).    Social History   Tobacco Use  Smoking Status Current Every Day Smoker  . Packs/day: 1.00  . Years: 14.00  . Pack years: 14.00  . Types: Cigarettes  . Last attempt to quit: 10/23/2005  . Years since quitting: 13.7  Smokeless Tobacco Never Used    Goals Met:  Independence with exercise equipment Exercise tolerated well No report of cardiac concerns or symptoms Strength training completed today  Goals Unmet:  Not Applicable  Comments: Pt able to follow exercise prescription today without complaint.  Will continue to monitor for progression. Check out 1200.   Dr. Kate Sable is Medical Director for [facility] Cardiac and Pulmonary Rehab.

## 2019-07-15 NOTE — Progress Notes (Signed)
Cardiac Individual Treatment Plan  Patient Details  Name: Jason Sheppard MRN: [id number] Date of Birth: Jul 31, 1981 Referring Provider:     [facility] from 06/23/2019 in [place]  Referring Provider  [place]      Initial Encounter Date:    CARDIAC REHAB PHASE II ORIENTATION from 06/23/2019 in [place]  Date  06/23/19      Visit Diagnosis: Infectious pericarditis, unspecified chronicity, unspecified infectious etiology  Patient's Home Medications on Admission:  Current Outpatient Medications:  .  bictegravir-emtricitabine-tenofovir AF (BIKTARVY) 50-200-25 MG TABS tablet, Take 1 tablet by mouth daily., Disp: , Rfl:  .  citalopram (CELEXA) 10 MG tablet, Take 20 mg by mouth daily. , Disp: , Rfl:  .  colchicine 0.6 MG tablet, Take 0.6 mg by mouth 2 (two) times daily with a meal. , Disp: , Rfl:  .  hydrOXYzine (VISTARIL) 25 MG capsule, Take 25 mg by mouth every 6 (six) hours as needed (extreme anxiety)., Disp: , Rfl:  .  ibuprofen (ADVIL) 400 MG tablet, Take 1 tablet (400 mg total) by mouth 3 (three) times daily., Disp: 15 tablet, Rfl: 0 .  levETIRAcetam (KEPPRA) 250 MG tablet, Take 125 mg by mouth 2 (two) times daily. Tapering off keppra while initiating oxcarbazepine. Currently at 1/2 tab twice daily, Disp: , Rfl:  .  lisinopril (ZESTRIL) 5 MG tablet, Take 5 mg by mouth daily., Disp: , Rfl:  .  montelukast (SINGULAIR) 10 MG tablet, Take 1 tablet (10 mg total) by mouth every morning., Disp: , Rfl:  .  nitroGLYCERIN (NITROSTAT) 0.4 MG SL tablet, Place 1 tablet (0.4 mg total) under the tongue every 5 (five) minutes as needed for chest pain., Disp: 30 tablet, Rfl: 0 .  ondansetron (ZOFRAN) 4 MG tablet, Take 1 tablet (4 mg total) by mouth every 6 (six) hours as needed for nausea. (Patient not taking: Reported on 06/23/2019), Disp: 20 tablet, Rfl: 0 .  OXcarbazepine (TRILEPTAL) 150 MG tablet, Take 300 mg by mouth 2 (two) times  daily. End goal of 300 mg BID. Patient currently working on increasing over a month with dose increase every week, Disp: , Rfl:  .  pantoprazole (PROTONIX) 40 MG tablet, Take 1 tablet (40 mg total) by mouth daily., Disp: 5 tablet, Rfl: 0 .  prazosin (MINIPRESS) 2 MG capsule, Take 2 mg by mouth at bedtime., Disp: , Rfl:   Past Medical History: Past Medical History:  Diagnosis Date  . Allergy   . Attention deficit disorder with hyperactivity   . Depression   . HIV (human immunodeficiency virus infection) ([place])   . Infective pericarditis   . Migraine   . OSA (obstructive sleep apnea)   . Plantar fascial fibromatosis   . PTSD (post-traumatic stress disorder)   . Seizure disorder ([place])     Tobacco Use: Social History   Tobacco Use  Smoking Status Current Every Day Smoker  . Packs/day: 1.00  . Years: 14.00  . Pack years: 14.00  . Types: Cigarettes  . Last attempt to quit: 10/23/2005  . Years since quitting: 13.7  Smokeless Tobacco Never Used    Labs: Recent Chemical engineer    Labs for ITP Cardiac and Pulmonary Rehab Latest Ref Rng & Units 12/07/2015 05/23/2016 11/28/2016 05/09/2017 02/25/2019   Cholestrol <200 mg/dL 202(H) 187 178 202(H) -   LDLCALC <100 mg/dL 132(H) 87 75 95 -   HDL >40 mg/dL 30(L) 27(L) 27(L) 31(L) -   Trlycerides <150  mg/dL 198(H) 365(H) 382(H) 380(H) -   Hemoglobin A1c 4.8 - 5.6 % - - - - 5.7(H)      Capillary Blood Glucose: No results found for: GLUCAP   Exercise Target Goals: Exercise Program Goal: Individual exercise prescription set using results from initial 6 min walk test and THRR while considering  patient's activity barriers and safety.   Exercise Prescription Goal: Starting with aerobic activity 30 plus minutes a day, 3 days per week for initial exercise prescription. Provide home exercise prescription and guidelines that participant acknowledges understanding prior to discharge.  Activity Barriers & Risk Stratification: Activity Barriers &  Cardiac Risk Stratification - 06/23/19 1337      Activity Barriers & Cardiac Risk Stratification   Activity Barriers  Back Problems;Shortness of Breath;Muscular Weakness    Cardiac Risk Stratification  High       6 Minute Walk: 6 Minute Walk    Row Name 06/23/19 1336         6 Minute Walk   Phase  Initial     Distance  1200 feet     Walk Time  6 minutes     # of Rest Breaks  0     MPH  2.27     METS  2.74     RPE  13     Perceived Dyspnea   14     VO2 Peak  15.05     Symptoms  No     Resting HR  71 bpm     Resting BP  130/90     Resting Oxygen Saturation   97 %     Exercise Oxygen Saturation  during 6 min walk  97 %     Max Ex. HR  85 bpm     Max Ex. BP  152/84     2 Minute Post BP  132/88        Oxygen Initial Assessment:   Oxygen Re-Evaluation:   Oxygen Discharge (Final Oxygen Re-Evaluation):   Initial Exercise Prescription: Initial Exercise Prescription - 06/23/19 1300      Date of Initial Exercise RX and Referring Provider   Date  06/23/19    Referring Provider  [place]    Expected Discharge Date  09/22/19      Treadmill   MPH  2    Grade  0    Minutes  17    METs  2.53      NuStep   Level  1    SPM  80    Minutes  17    METs  1.5      Prescription Details   Frequency (times per week)  3    Duration  Progress to 30 minutes of continuous aerobic without signs/symptoms of physical distress      Intensity   THRR 40-80% of Max Heartrate  [phone]    Ratings of Perceived Exertion  11-15    Perceived Dyspnea  0-4      Progression   Progression  Continue to progress workloads to maintain intensity without signs/symptoms of physical distress.      Resistance Training   Training Prescription  Yes    Weight  1    Reps  10-15       Perform Capillary Blood Glucose checks as needed.  Exercise Prescription Changes:  Exercise Prescription Changes    Row Name 06/23/19 1300 07/15/19 1200           Response to Exercise   Blood  Pressure (Admit)  130/90  128/82      Blood Pressure (Exercise)  152/84  144/80      Blood Pressure (Exit)  132/88  118/80      Heart Rate (Admit)  71 bpm  70 bpm      Heart Rate (Exercise)  85 bpm  96 bpm      Heart Rate (Exit)  77 bpm  79 bpm      Oxygen Saturation (Admit)  97 %  -      Oxygen Saturation (Exercise)  97 %  -      Oxygen Saturation (Exit)  98 %  -      Rating of Perceived Exertion (Exercise)  13  12      Perceived Dyspnea (Exercise)  14  -      Symptoms  SOB  -      Comments   first two weeks of exercise       Duration  Progress to 30 minutes of  aerobic without signs/symptoms of physical distress  Continue with 30 min of aerobic exercise without signs/symptoms of physical distress.      Intensity  THRR New 114-136-158  THRR unchanged        Resistance Training   Training Prescription  -  Yes      Weight  -  3      Reps  -  10-15        Treadmill   MPH  -  2.8      Grade  -  0      Minutes  -  17      METs  -  3.14        Recumbant Elliptical   Level  -  1      RPM  -  72      Watts  -  110      Minutes  -  22      METs  -  3.58        Home Exercise Plan   Plans to continue exercise at  -  Home (comment)      Frequency  -  Add 2 additional days to program exercise sessions.      Initial Home Exercises Provided  -  07/15/19         Exercise Comments:  Exercise Comments    Row Name 07/15/19 1246           Exercise Comments  Pt. has attended 7 exercise sessions so far. He has tolerated the exercise with ease, just has some shortness of breath every once in a while. We will continue to monitor and progress as needed.          Exercise Goals and Review:  Exercise Goals    Row Name 06/23/19 1340             Exercise Goals   Increase Physical Activity  Yes       Intervention  Provide advice, education, support and counseling about physical activity/exercise needs.;Develop an individualized exercise prescription for aerobic and resistive  training based on initial evaluation findings, risk stratification, comorbidities and participant's personal goals.       Expected Outcomes  Short Term: Attend rehab on a regular basis to increase amount of physical activity.;Long Term: Add in home exercise to make exercise part of routine and to increase amount of physical activity.;Long Term: Exercising regularly at least 3-5 days a week.  Increase Strength and Stamina  Yes       Intervention  Provide advice, education, support and counseling about physical activity/exercise needs.;Develop an individualized exercise prescription for aerobic and resistive training based on initial evaluation findings, risk stratification, comorbidities and participant's personal goals.       Expected Outcomes  Short Term: Increase workloads from initial exercise prescription for resistance, speed, and METs.;Short Term: Perform resistance training exercises routinely during rehab and add in resistance training at home;Long Term: Improve cardiorespiratory fitness, muscular endurance and strength as measured by increased METs and functional capacity (6MWT)       Able to understand and use rate of perceived exertion (RPE) scale  Yes       Intervention  Provide education and explanation on how to use RPE scale       Expected Outcomes  Short Term: Able to use RPE daily in rehab to express subjective intensity level;Long Term:  Able to use RPE to guide intensity level when exercising independently       Able to understand and use Dyspnea scale  Yes       Intervention  Provide education and explanation on how to use Dyspnea scale       Expected Outcomes  Short Term: Able to use Dyspnea scale daily in rehab to express subjective sense of shortness of breath during exertion;Long Term: Able to use Dyspnea scale to guide intensity level when exercising independently       Knowledge and understanding of Target Heart Rate Range (THRR)  Yes       Intervention  Provide education  and explanation of THRR including how the numbers were predicted and where they are located for reference       Expected Outcomes  Short Term: Able to state/look up THRR;Long Term: Able to use THRR to govern intensity when exercising independently;Short Term: Able to use daily as guideline for intensity in rehab       Able to check pulse independently  Yes       Intervention  Provide education and demonstration on how to check pulse in carotid and radial arteries.;Review the importance of being able to check your own pulse for safety during independent exercise       Expected Outcomes  Short Term: Able to explain why pulse checking is important during independent exercise;Long Term: Able to check pulse independently and accurately       Understanding of Exercise Prescription  Yes       Intervention  Provide education, explanation, and written materials on patient's individual exercise prescription       Expected Outcomes  Short Term: Able to explain program exercise prescription;Long Term: Able to explain home exercise prescription to exercise independently          Exercise Goals Re-Evaluation : Exercise Goals Re-Evaluation    Row Name 07/15/19 1244             Exercise Goal Re-Evaluation   Exercise Goals Review  Increase Physical Activity;Increase Strength and Stamina;Able to understand and use rate of perceived exertion (RPE) scale;Able to understand and use Dyspnea scale;Knowledge and understanding of Target Heart Rate Range (THRR);Able to check pulse independently;Understanding of Exercise Prescription       Comments  Pt. is still fairly new to the program. He has exerperience working out from [employer] so he is somewhat fit. He has tolerated all the exercise well so far.       Expected Outcomes  Short: improve SOB Long:  get back to doing the things he was before his diagnosis           Discharge Exercise Prescription (Final Exercise Prescription Changes): Exercise Prescription  Changes - 07/15/19 1200      Response to Exercise   Blood Pressure (Admit)  128/82    Blood Pressure (Exercise)  144/80    Blood Pressure (Exit)  118/80    Heart Rate (Admit)  70 bpm    Heart Rate (Exercise)  96 bpm    Heart Rate (Exit)  79 bpm    Rating of Perceived Exertion (Exercise)  12    Comments  first two weeks of exercise     Duration  Continue with 30 min of aerobic exercise without signs/symptoms of physical distress.    Intensity  THRR unchanged      Resistance Training   Training Prescription  Yes    Weight  3    Reps  10-15      Treadmill   MPH  2.8    Grade  0    Minutes  17    METs  3.14      Recumbant Elliptical   Level  1    RPM  72    Watts  110    Minutes  22    METs  3.58      Home Exercise Plan   Plans to continue exercise at  Home (comment)    Frequency  Add 2 additional days to program exercise sessions.    Initial Home Exercises Provided  07/15/19       Nutrition:  Target Goals: Understanding of nutrition guidelines, daily intake of sodium 1500mg , cholesterol 200mg , calories 30% from fat and 7% or less from saturated fats, daily to have 5 or more servings of fruits and vegetables.  Biometrics: Pre Biometrics - 06/23/19 1340      Pre Biometrics   Height  5\' 8"  (1.727 m)    Waist Circumference  37.5 inches    Hip Circumference  39 inches    Waist to Hip Ratio  0.96 %    Triceps Skinfold  6 mm    % Body Fat  22.1 %    Grip Strength  2.3 kg   kg   Flexibility  0 in   lower back pain due to cartilage issues and fusion.   Single Leg Stand  60 seconds        Nutrition Therapy Plan and Nutrition Goals: Nutrition Therapy & Goals - 07/15/19 1010      Personal Nutrition Goals   Comments  We have not resumed our RD classes post COVID. We are working with a new dietician to resume these classes. In the interim, we are providing education through hand-outs. Patient says he has not changed his diet since he started the program. Will  continue to monitor for progress.      Intervention Plan   Intervention  Nutrition handout(s) given to patient.    Expected Outcomes  Short Term Goal: Understand basic principles of dietary content, such as calories, fat, sodium, cholesterol and nutrients.       Nutrition Assessments: Nutrition Assessments - 06/23/19 1454      MEDFICTS Scores   Pre Score  42       Nutrition Goals Re-Evaluation:   Nutrition Goals Discharge (Final Nutrition Goals Re-Evaluation):   Psychosocial: Target Goals: Acknowledge presence or absence of significant depression and/or stress, maximize coping skills, provide positive support system. Participant is able  to verbalize types and ability to use techniques and skills needed for reducing stress and depression.  Initial Review & Psychosocial Screening: Initial Psych Review & Screening - 06/23/19 1452      Initial Review   Current issues with  History of Depression;Current Anxiety/Panic;Current Stress Concerns    Source of Stress Concerns  None Identified    Comments  PTSD      Family Dynamics   Good Support System?  Yes      Barriers   Psychosocial barriers to participate in program  The patient should benefit from training in stress management and relaxation.      Screening Interventions   Interventions  Encouraged to exercise    Expected Outcomes  Short Term goal: Identification and review with participant of any Quality of Life or Depression concerns found by scoring the questionnaire.;Long Term goal: The participant improves quality of Life and PHQ9 Scores as seen by post scores and/or verbalization of changes       Quality of Life Scores: Quality of Life - 06/23/19 1341      Quality of Life   Select  Quality of Life      Quality of Life Scores   Health/Function Pre  10.1 %    Socioeconomic Pre  19.38 %    Psych/Spiritual Pre  18.79 %    Family Pre  12 %    GLOBAL Pre  14.23 %      Scores of 19 and below usually indicate a  poorer quality of life in these areas.  A difference of  2-3 points is a clinically meaningful difference.  A difference of 2-3 points in the total score of the Quality of Life Index has been associated with significant improvement in overall quality of life, self-image, physical symptoms, and general health in studies assessing change in quality of life.  PHQ-9: Recent Review Flowsheet Data    Depression screen [facility] 2/9 06/23/2019 06/06/2016 12/16/2015 11/29/2015   Decreased Interest 2 0 0 0   Down, Depressed, Hopeless 1 0 0 0   PHQ - 2 Score 3 0 0 0   Altered sleeping 2 - - -   Tired, decreased energy 3 - - -   Change in appetite 1 - - -   Feeling bad or failure about yourself  2 - - -   Trouble concentrating 2 - - -   Moving slowly or fidgety/restless 3 - - -   Suicidal thoughts 2 - - -   PHQ-9 Score 18 - - -   Difficult doing work/chores Extremely dIfficult - - -     Interpretation of Total Score  Total Score Depression Severity:  1-4 = Minimal depression, 5-9 = Mild depression, 10-14 = Moderate depression, 15-19 = Moderately severe depression, 20-27 = Severe depression   Psychosocial Evaluation and Intervention: Psychosocial Evaluation - 06/23/19 1453      Psychosocial Evaluation & Interventions   Interventions  Encouraged to exercise with the program and follow exercise prescription    Continue Psychosocial Services   Follow up required by staff       Psychosocial Re-Evaluation: Psychosocial Re-Evaluation    Row Name 07/15/19 1013             Psychosocial Re-Evaluation   Current issues with  History of Depression;Current Anxiety/Panic       Comments  Patinet's intiial QOL score was 14.23 and his PHQ-9 score was 18. He does suffer from PTSD from his service in the [employer].  His depression is managed with Citalopram 10 mg daily and he is in counseling. He feels his psychosocial issues are managed. Will continue to monitor for progress.       Expected Outcomes  Patient will  have improved QOL and PHQ-9 scores at discharage and his psychosocial issues will continue to be managed with no additional issues identified.       Interventions  Stress management education;Encouraged to attend Cardiac Rehabilitation for the exercise;Relaxation education       Continue Psychosocial Services   Follow up required by staff          Psychosocial Discharge (Final Psychosocial Re-Evaluation): Psychosocial Re-Evaluation - 07/15/19 1013      Psychosocial Re-Evaluation   Current issues with  History of Depression;Current Anxiety/Panic    Comments  Patinet's intiial QOL score was 14.23 and his PHQ-9 score was 18. He does suffer from PTSD from his service in the [employer]. His depression is managed with Citalopram 10 mg daily and he is in counseling. He feels his psychosocial issues are managed. Will continue to monitor for progress.    Expected Outcomes  Patient will have improved QOL and PHQ-9 scores at discharage and his psychosocial issues will continue to be managed with no additional issues identified.    Interventions  Stress management education;Encouraged to attend Cardiac Rehabilitation for the exercise;Relaxation education    Continue Psychosocial Services   Follow up required by staff       Vocational Rehabilitation: Provide vocational rehab assistance to qualifying candidates.   Vocational Rehab Evaluation & Intervention: Vocational Rehab - 06/23/19 1455      Initial Vocational Rehab Evaluation & Intervention   Assessment shows need for Vocational Rehabilitation  No       Education: Education Goals: Education classes will be provided on a weekly basis, covering required topics. Participant will state understanding/return demonstration of topics presented.  Learning Barriers/Preferences: Learning Barriers/Preferences - 06/23/19 1455      Learning Barriers/Preferences   Learning Barriers  None    Learning Preferences  Video;Written Material;Verbal  Instruction;Individual Instruction;Group Instruction       Education Topics: Hypertension, Hypertension Reduction -Define heart disease and high blood pressure. Discus how high blood pressure affects the body and ways to reduce high blood pressure.   Exercise and Your Heart -Discuss why it is important to exercise, the FITT principles of exercise, normal and abnormal responses to exercise, and how to exercise safely.   Angina -Discuss definition of angina, causes of angina, treatment of angina, and how to decrease risk of having angina.   Cardiac Medications -Review what the following cardiac medications are used for, how they affect the body, and side effects that may occur when taking the medications.  Medications include Aspirin, Beta blockers, calcium channel blockers, ACE Inhibitors, angiotensin receptor blockers, diuretics, digoxin, and antihyperlipidemics.   Congestive Heart Failure -Discuss the definition of CHF, how to live with CHF, the signs and symptoms of CHF, and how keep track of weight and sodium intake.   Heart Disease and Intimacy -Discus the effect sexual activity has on the heart, how changes occur during intimacy as we age, and safety during sexual activity.   Smoking Cessation / COPD -Discuss different methods to quit smoking, the health benefits of quitting smoking, and the definition of COPD.   Nutrition I: Fats -Discuss the types of cholesterol, what cholesterol does to the heart, and how cholesterol levels can be controlled.   Nutrition II: Labels -Discuss the different components of food  labels and how to read food label   CARDIAC REHAB PHASE II EXERCISE from 07/02/2019 in [facility]  Date  06/25/19  Educator  DC  Instruction Review Code  1- Verbalizes Understanding      Heart Parts/Heart Disease and PAD -Discuss the anatomy of the heart, the pathway of blood circulation through the heart, and these are affected by heart  disease.   CARDIAC REHAB PHASE II EXERCISE from 07/02/2019 in [facility]  Date  07/02/19  Educator  DJ  Instruction Review Code  2- Demonstrated Understanding      Stress I: Signs and Symptoms -Discuss the causes of stress, how stress may lead to anxiety and depression, and ways to limit stress.   Stress II: Relaxation -Discuss different types of relaxation techniques to limit stress.   Warning Signs of Stroke / TIA -Discuss definition of a stroke, what the signs and symptoms are of a stroke, and how to identify when someone is having stroke.   Knowledge Questionnaire Score: Knowledge Questionnaire Score - 06/23/19 1455      Knowledge Questionnaire Score   Pre Score  24/28       Core Components/Risk Factors/Patient Goals at Admission: Personal Goals and Risk Factors at Admission - 06/23/19 1455      Core Components/Risk Factors/Patient Goals on Admission    Weight Management  Weight Maintenance    Personal Goal Other  Yes    Personal Goal  GEt rid of SOB, Be able to do things easier, do more things with grandchildren.    Intervention  Attend CR 3 x times week and supplement with at home exercise 2 x week.    Expected Outcomes  Reach expected goals.       Core Components/Risk Factors/Patient Goals Review:  Goals and Risk Factor Review    Row Name 07/15/19 1011             Core Components/Risk Factors/Patient Goals Review   Personal Goals Review  Weight Management/Obesity;Other;Improve shortness of breath with ADL's No SOB; get things done easier; do things with grandchildren.       Review  Patient has completed 7 sessions maintaining his weight since last 30 day review. He is doing well in the program with progression. He says he is breathing better and is now not afraid to walk and exercise some on his own. He does continue to smoke 1 pack/day cigerattes. He says he is not trying to quit at this time. He is working toward meeting his personal  goals. Will continue to monitor for progress.       Expected Outcomes  Patient will continue to attend sessions meeting his personal goals.          Core Components/Risk Factors/Patient Goals at Discharge (Final Review):  Goals and Risk Factor Review - 07/15/19 1011      Core Components/Risk Factors/Patient Goals Review   Personal Goals Review  Weight Management/Obesity;Other;Improve shortness of breath with ADL's   No SOB; get things done easier; do things with grandchildren.   Review  Patient has completed 7 sessions maintaining his weight since last 30 day review. He is doing well in the program with progression. He says he is breathing better and is now not afraid to walk and exercise some on his own. He does continue to smoke 1 pack/day cigerattes. He says he is not trying to quit at this time. He is working toward meeting his personal goals. Will continue to monitor for  progress.    Expected Outcomes  Patient will continue to attend sessions meeting his personal goals.       ITP Comments: ITP Comments    Row Name 06/23/19 1443           ITP Comments  Patient has PTSD. His triggers are loud noises, people behind him, not having his back to a wall, un eased when people enter the room with out introduction. He is otherwise mild temperment.          Comments: ITP REVIEW Pt is making expected progress toward Cardiac Rehab goals after completing 7 sessions. Recommend continued exercise, life style modification, education, and increased stamina and strength.

## 2019-07-16 ENCOUNTER — Encounter (HOSPITAL_COMMUNITY)
Admission: RE | Admit: 2019-07-16 | Discharge: 2019-07-16 | Disposition: A | Discharge status: Home / Self Care | Payer: No Typology Code available for payment source | Source: Ambulatory Visit | Attending: Internal Medicine | Admitting: Internal Medicine

## 2019-07-16 ENCOUNTER — Other Ambulatory Visit: Payer: Self-pay

## 2019-07-16 DIAGNOSIS — I301 Infective pericarditis: Secondary | ICD-10-CM | POA: Diagnosis not present

## 2019-07-16 NOTE — Progress Notes (Signed)
Daily Session Note  Patient Details  Name: Jason Sheppard MRN: [id number] Date of Birth: 1981/06/12 Referring Provider:     [place] from 06/23/2019 in [place]  Referring Provider  [place]      Encounter Date: 07/16/2019  Check In: Session Check In - 07/16/19 1430      Check-In   Supervising physician immediately available to respond to emergencies  See telemetry face sheet for immediately available ER MD    Location  [facility]    Staff Present  Russella Dar, MS, EP, [facility], Exercise Physiologist;Amanda Ballard, Exercise Physiologist;Adarian Bur Wynetta Emery, RN, BSN    Virtual Visit  No    Medication changes reported      No    Fall or balance concerns reported     No    Tobacco Cessation  No Change    Warm-up and Cool-down  Performed as group-led instruction    Resistance Training Performed  Yes    VAD Patient?  No    PAD/SET Patient?  No      Pain Assessment   Currently in Pain?  No/denies    Pain Score  0-No pain    Multiple Pain Sites  No       Capillary Blood Glucose: No results found for this or any previous visit (from the past 24 hour(s)).    Social History   Tobacco Use  Smoking Status Current Every Day Smoker  . Packs/day: 1.00  . Years: 14.00  . Pack years: 14.00  . Types: Cigarettes  . Last attempt to quit: 10/23/2005  . Years since quitting: 13.7  Smokeless Tobacco Never Used    Goals Met:  Independence with exercise equipment Exercise tolerated well No report of cardiac concerns or symptoms Strength training completed today  Goals Unmet:  Not Applicable  Comments: Pt able to follow exercise prescription today without complaint.  Will continue to monitor for progression. Check out 1530.   Dr. Kate Sable is Medical Director for [facility] Cardiac and Pulmonary Rehab.

## 2019-07-18 ENCOUNTER — Encounter (HOSPITAL_COMMUNITY): Payer: No Typology Code available for payment source

## 2019-07-21 ENCOUNTER — Other Ambulatory Visit: Payer: Self-pay

## 2019-07-21 ENCOUNTER — Encounter (HOSPITAL_COMMUNITY)
Admission: RE | Admit: 2019-07-21 | Discharge: 2019-07-21 | Disposition: A | Discharge status: Home / Self Care | Payer: No Typology Code available for payment source | Source: Ambulatory Visit | Attending: Internal Medicine | Admitting: Internal Medicine

## 2019-07-21 DIAGNOSIS — I301 Infective pericarditis: Secondary | ICD-10-CM

## 2019-07-21 NOTE — Progress Notes (Signed)
Daily Session Note  Patient Details  Name: Jason Sheppard MRN: [id number] Date of Birth: 04-Mar-1981 Referring Provider:     [facility] from 06/23/2019 in [place]  Referring Provider  [place]      Encounter Date: 07/21/2019  Check In: Session Check In - 07/21/19 1430      Check-In   Supervising physician immediately available to respond to emergencies  See telemetry face sheet for immediately available ER MD    Location  [facility]    Staff Present  Russella Dar, MS, EP, [facility], Exercise Physiologist;Amanda Ballard, Exercise Physiologist;Oliverio Cho Wynetta Emery, RN, BSN    Virtual Visit  No    Medication changes reported      No    Fall or balance concerns reported     No    Tobacco Cessation  No Change    Warm-up and Cool-down  Performed as group-led instruction    Resistance Training Performed  Yes    VAD Patient?  No    PAD/SET Patient?  No      Pain Assessment   Currently in Pain?  No/denies    Pain Score  0-No pain    Multiple Pain Sites  No       Capillary Blood Glucose: No results found for this or any previous visit (from the past 24 hour(s)).    Social History   Tobacco Use  Smoking Status Current Every Day Smoker  . Packs/day: 1.00  . Years: 14.00  . Pack years: 14.00  . Types: Cigarettes  . Last attempt to quit: 10/23/2005  . Years since quitting: 13.7  Smokeless Tobacco Never Used    Goals Met:  Independence with exercise equipment Exercise tolerated well No report of cardiac concerns or symptoms Strength training completed today  Goals Unmet:  Not Applicable  Comments: Pt able to follow exercise prescription today without complaint.  Will continue to monitor for progression. Check out 1330.   Dr. Kate Sable is Medical Director for [facility] Cardiac and Pulmonary Rehab.

## 2019-07-23 ENCOUNTER — Other Ambulatory Visit: Payer: Self-pay

## 2019-07-23 ENCOUNTER — Encounter (HOSPITAL_COMMUNITY)
Admission: RE | Admit: 2019-07-23 | Discharge: 2019-07-23 | Disposition: A | Discharge status: Home / Self Care | Payer: No Typology Code available for payment source | Source: Ambulatory Visit | Attending: Internal Medicine | Admitting: Internal Medicine

## 2019-07-23 DIAGNOSIS — I301 Infective pericarditis: Secondary | ICD-10-CM | POA: Diagnosis not present

## 2019-07-23 NOTE — Progress Notes (Signed)
Daily Session Note  Patient Details  Name: Jason Sheppard MRN: [id number] Date of Birth: Mar 11, 1981 Referring Provider:     CARDIAC REHAB PHASE II ORIENTATION from 06/23/2019 in [place]  Referring Provider  [place]      Encounter Date: 07/23/2019  Check In: Session Check In - 07/23/19 1430      Check-In   Supervising physician immediately available to respond to emergencies  See telemetry face sheet for immediately available ER MD    Location  [facility]    Staff Present  Russella Dar, MS, EP, [facility], Exercise Physiologist;Amanda Ballard, Exercise Physiologist;Aydan Phoenix Wynetta Emery, RN, BSN    Virtual Visit  No    Medication changes reported      No    Fall or balance concerns reported     No    Tobacco Cessation  No Change    Warm-up and Cool-down  Performed as group-led instruction    Resistance Training Performed  Yes    VAD Patient?  No    PAD/SET Patient?  No      Pain Assessment   Currently in Pain?  No/denies    Pain Score  0-No pain    Multiple Pain Sites  No       Capillary Blood Glucose: No results found for this or any previous visit (from the past 24 hour(s)).    Social History   Tobacco Use  Smoking Status Current Every Day Smoker  . Packs/day: 1.00  . Years: 14.00  . Pack years: 14.00  . Types: Cigarettes  . Last attempt to quit: 10/23/2005  . Years since quitting: 13.7  Smokeless Tobacco Never Used    Goals Met:  Independence with exercise equipment Personal goals reviewed No report of cardiac concerns or symptoms Strength training completed today  Goals Unmet:  Not Applicable  Comments: Pt able to follow exercise prescription today without complaint.  Will continue to monitor for progression. Check out 1530   Dr. Kate Sable is Medical Director for [facility] Cardiac and Pulmonary Rehab.

## 2019-07-25 ENCOUNTER — Encounter (HOSPITAL_COMMUNITY)
Admission: RE | Admit: 2019-07-25 | Discharge: 2019-07-25 | Disposition: A | Discharge status: Home / Self Care | Payer: No Typology Code available for payment source | Source: Ambulatory Visit | Attending: Internal Medicine | Admitting: Internal Medicine

## 2019-07-25 ENCOUNTER — Other Ambulatory Visit: Payer: Self-pay

## 2019-07-25 DIAGNOSIS — I301 Infective pericarditis: Secondary | ICD-10-CM | POA: Insufficient documentation

## 2019-07-25 NOTE — Progress Notes (Signed)
Daily Session Note  Patient Details  Name: Jason Sheppard MRN: [id number] Date of Birth: 02/06/1981 Referring Provider:     [place] from 06/23/2019 in [place]  Referring Provider  [place]      Encounter Date: 07/25/2019  Check In: Session Check In - 07/25/19 1430      Check-In   Supervising physician immediately available to respond to emergencies  See telemetry face sheet for immediately available ER MD    Location  [facility]    Staff Present  Russella Dar, MS, EP, [facility], Exercise Physiologist;Amanda Ballard, Exercise Physiologist;Daniel Johndrow Wynetta Emery, RN, BSN    Virtual Visit  No    Medication changes reported      No    Fall or balance concerns reported     No    Tobacco Cessation  No Change    Warm-up and Cool-down  Performed as group-led instruction    Resistance Training Performed  Yes    VAD Patient?  No    PAD/SET Patient?  No      Pain Assessment   Currently in Pain?  No/denies    Pain Score  0-No pain    Multiple Pain Sites  No       Capillary Blood Glucose: No results found for this or any previous visit (from the past 24 hour(s)).    Social History   Tobacco Use  Smoking Status Current Every Day Smoker  . Packs/day: 1.00  . Years: 14.00  . Pack years: 14.00  . Types: Cigarettes  . Last attempt to quit: 10/23/2005  . Years since quitting: 13.7  Smokeless Tobacco Never Used    Goals Met:  Independence with exercise equipment Exercise tolerated well No report of cardiac concerns or symptoms Strength training completed today  Goals Unmet:  Not Applicable  Comments: Pt able to follow exercise prescription today without complaint.  Will continue to monitor for progression. Check out 1530.   Dr. Kate Sable is Medical Director for [facility] Cardiac and Pulmonary Rehab.

## 2019-07-28 ENCOUNTER — Encounter (HOSPITAL_COMMUNITY): Payer: No Typology Code available for payment source

## 2019-07-30 ENCOUNTER — Encounter (HOSPITAL_COMMUNITY)
Admission: RE | Admit: 2019-07-30 | Discharge: 2019-07-30 | Disposition: A | Discharge status: Home / Self Care | Payer: No Typology Code available for payment source | Source: Ambulatory Visit | Attending: Internal Medicine | Admitting: Internal Medicine

## 2019-07-30 ENCOUNTER — Other Ambulatory Visit: Payer: Self-pay

## 2019-07-30 DIAGNOSIS — I301 Infective pericarditis: Secondary | ICD-10-CM

## 2019-07-30 NOTE — Progress Notes (Signed)
Daily Session Note  Patient Details  Name: Jason Sheppard MRN: [id number] Date of Birth: 08/28/1981 Referring Provider:     CARDIAC REHAB PHASE II ORIENTATION from confidential encounter on 06/23/2019  Referring Provider  [place]      Encounter Date: 07/30/2019  Check In: Session Check In - 07/30/19 1503      Check-In   Supervising physician immediately available to respond to emergencies  See telemetry face sheet for immediately available MD    Location  [facility]    Staff Present  Russella Dar, MS, EP, [facility], Exercise Physiologist;Debra Wynetta Emery, RN, BSN    Virtual Visit  No    Medication changes reported      No    Fall or balance concerns reported     No    Tobacco Cessation  No Change    Warm-up and Cool-down  Performed as group-led instruction    Resistance Training Performed  Yes    VAD Patient?  No    PAD/SET Patient?  No      Pain Assessment   Currently in Pain?  No/denies    Pain Score  0-No pain    Multiple Pain Sites  No       Capillary Blood Glucose: No results found for this or any previous visit (from the past 24 hour(s)).    Social History   Tobacco Use  Smoking Status Current Every Day Smoker  . Packs/day: 1.00  . Years: 14.00  . Pack years: 14.00  . Types: Cigarettes  . Last attempt to quit: 10/23/2005  . Years since quitting: 13.7  Smokeless Tobacco Never Used    Goals Met:  Independence with exercise equipment Exercise tolerated well Personal goals reviewed No report of cardiac concerns or symptoms Strength training completed today  Goals Unmet:  Not Applicable  Comments: Check out 1330   Dr. Kate Sable is Medical Director for [facility] and Pulmonary Rehab.

## 2019-08-01 ENCOUNTER — Encounter (HOSPITAL_COMMUNITY)
Admission: RE | Admit: 2019-08-01 | Discharge: 2019-08-01 | Disposition: A | Discharge status: Home / Self Care | Payer: No Typology Code available for payment source | Source: Ambulatory Visit | Attending: Internal Medicine | Admitting: Internal Medicine

## 2019-08-01 ENCOUNTER — Other Ambulatory Visit: Payer: Self-pay

## 2019-08-01 DIAGNOSIS — I301 Infective pericarditis: Secondary | ICD-10-CM | POA: Diagnosis not present

## 2019-08-01 NOTE — Progress Notes (Signed)
Daily Session Note  Patient Details  Name: VICTORMANUEL MCLURE MRN: [id number] Date of Birth: 12/06/1980 Referring Provider:     [place] from 06/23/2019 in [place]  Referring Provider  [place]      Encounter Date: 08/01/2019  Check In: Session Check In - 08/01/19 1430      Check-In   Supervising physician immediately available to respond to emergencies  See telemetry face sheet for immediately available MD    Location  [facility]    Staff Present  Russella Dar, MS, EP, [facility], Exercise Physiologist;Madisson Kulaga Wynetta Emery, RN, BSN    Virtual Visit  No    Medication changes reported      No    Fall or balance concerns reported     No    Tobacco Cessation  No Change    Warm-up and Cool-down  Performed as group-led instruction    Resistance Training Performed  Yes    VAD Patient?  No    PAD/SET Patient?  No      Pain Assessment   Currently in Pain?  No/denies    Pain Score  0-No pain    Multiple Pain Sites  No       Capillary Blood Glucose: No results found for this or any previous visit (from the past 24 hour(s)).    Social History   Tobacco Use  Smoking Status Current Every Day Smoker  . Packs/day: 1.00  . Years: 14.00  . Pack years: 14.00  . Types: Cigarettes  . Last attempt to quit: 10/23/2005  . Years since quitting: 13.7  Smokeless Tobacco Never Used    Goals Met:  Independence with exercise equipment Exercise tolerated well No report of cardiac concerns or symptoms Strength training completed today  Goals Unmet:  Not Applicable  Comments: Pt able to follow exercise prescription today without complaint.  Will continue to monitor for progression. Check out 1530.   Dr. Kate Sable is Medical Director for [facility] Cardiac and Pulmonary Rehab.

## 2019-08-04 ENCOUNTER — Encounter (HOSPITAL_COMMUNITY)
Admission: RE | Admit: 2019-08-04 | Discharge: 2019-08-04 | Disposition: A | Discharge status: Home / Self Care | Payer: No Typology Code available for payment source | Source: Ambulatory Visit | Attending: Internal Medicine | Admitting: Internal Medicine

## 2019-08-04 ENCOUNTER — Other Ambulatory Visit: Payer: Self-pay

## 2019-08-04 DIAGNOSIS — I301 Infective pericarditis: Secondary | ICD-10-CM | POA: Diagnosis not present

## 2019-08-04 NOTE — Progress Notes (Signed)
Daily Session Note  Patient Details  Name: JOUSHUA DUGAR MRN: [id number] Date of Birth: 1981-04-09 Referring Provider:     [place] from 06/23/2019 in [place]  Referring Provider  [place]      Encounter Date: 08/04/2019  Check In: Session Check In - 08/04/19 1430      Check-In   Supervising physician immediately available to respond to emergencies  See telemetry face sheet for immediately available MD    Location  [facility]    Staff Present  Russella Dar, MS, EP, [facility], Exercise Physiologist;Kinslie Hove Wynetta Emery, RN, BSN    Virtual Visit  No    Medication changes reported      No    Fall or balance concerns reported     No    Tobacco Cessation  No Change    Warm-up and Cool-down  Performed as group-led instruction    Resistance Training Performed  Yes    VAD Patient?  No    PAD/SET Patient?  No      Pain Assessment   Currently in Pain?  No/denies    Pain Score  0-No pain    Multiple Pain Sites  No       Capillary Blood Glucose: No results found for this or any previous visit (from the past 24 hour(s)).    Social History   Tobacco Use  Smoking Status Current Every Day Smoker  . Packs/day: 1.00  . Years: 14.00  . Pack years: 14.00  . Types: Cigarettes  . Last attempt to quit: 10/23/2005  . Years since quitting: 13.7  Smokeless Tobacco Never Used    Goals Met:  Independence with exercise equipment Exercise tolerated well No report of cardiac concerns or symptoms Strength training completed today  Goals Unmet:  Not Applicable  Comments: Pt able to follow exercise prescription today without complaint.  Will continue to monitor for progression. Check out 1530.   Dr. Kate Sable is Medical Director for [facility] Cardiac and Pulmonary Rehab.

## 2019-08-06 ENCOUNTER — Encounter (HOSPITAL_COMMUNITY): Payer: No Typology Code available for payment source

## 2019-08-06 NOTE — Progress Notes (Signed)
Cardiac Individual Treatment Plan  Patient Details  Name: Jason Sheppard MRN: [id number] Date of Birth: Jul 31, 1981 Referring Provider:     [facility] from 06/23/2019 in [place]  Referring Provider  [place]      Initial Encounter Date:    CARDIAC REHAB PHASE II ORIENTATION from 06/23/2019 in [place]  Date  06/23/19      Visit Diagnosis: Infectious pericarditis, unspecified chronicity, unspecified infectious etiology  Patient's Home Medications on Admission:  Current Outpatient Medications:  .  bictegravir-emtricitabine-tenofovir AF (BIKTARVY) 50-200-25 MG TABS tablet, Take 1 tablet by mouth daily., Disp: , Rfl:  .  citalopram (CELEXA) 10 MG tablet, Take 20 mg by mouth daily. , Disp: , Rfl:  .  colchicine 0.6 MG tablet, Take 0.6 mg by mouth 2 (two) times daily with a meal. , Disp: , Rfl:  .  hydrOXYzine (VISTARIL) 25 MG capsule, Take 25 mg by mouth every 6 (six) hours as needed (extreme anxiety)., Disp: , Rfl:  .  ibuprofen (ADVIL) 400 MG tablet, Take 1 tablet (400 mg total) by mouth 3 (three) times daily., Disp: 15 tablet, Rfl: 0 .  levETIRAcetam (KEPPRA) 250 MG tablet, Take 125 mg by mouth 2 (two) times daily. Tapering off keppra while initiating oxcarbazepine. Currently at 1/2 tab twice daily, Disp: , Rfl:  .  lisinopril (ZESTRIL) 5 MG tablet, Take 5 mg by mouth daily., Disp: , Rfl:  .  montelukast (SINGULAIR) 10 MG tablet, Take 1 tablet (10 mg total) by mouth every morning., Disp: , Rfl:  .  nitroGLYCERIN (NITROSTAT) 0.4 MG SL tablet, Place 1 tablet (0.4 mg total) under the tongue every 5 (five) minutes as needed for chest pain., Disp: 30 tablet, Rfl: 0 .  ondansetron (ZOFRAN) 4 MG tablet, Take 1 tablet (4 mg total) by mouth every 6 (six) hours as needed for nausea. (Patient not taking: Reported on 06/23/2019), Disp: 20 tablet, Rfl: 0 .  OXcarbazepine (TRILEPTAL) 150 MG tablet, Take 300 mg by mouth 2 (two) times  daily. End goal of 300 mg BID. Patient currently working on increasing over a month with dose increase every week, Disp: , Rfl:  .  pantoprazole (PROTONIX) 40 MG tablet, Take 1 tablet (40 mg total) by mouth daily., Disp: 5 tablet, Rfl: 0 .  prazosin (MINIPRESS) 2 MG capsule, Take 2 mg by mouth at bedtime., Disp: , Rfl:   Past Medical History: Past Medical History:  Diagnosis Date  . Allergy   . Attention deficit disorder with hyperactivity   . Depression   . HIV (human immunodeficiency virus infection) ([place])   . Infective pericarditis   . Migraine   . OSA (obstructive sleep apnea)   . Plantar fascial fibromatosis   . PTSD (post-traumatic stress disorder)   . Seizure disorder ([place])     Tobacco Use: Social History   Tobacco Use  Smoking Status Current Every Day Smoker  . Packs/day: 1.00  . Years: 14.00  . Pack years: 14.00  . Types: Cigarettes  . Last attempt to quit: 10/23/2005  . Years since quitting: 13.7  Smokeless Tobacco Never Used    Labs: Recent Chemical engineer    Labs for ITP Cardiac and Pulmonary Rehab Latest Ref Rng & Units 12/07/2015 05/23/2016 11/28/2016 05/09/2017 02/25/2019   Cholestrol <200 mg/dL 202(H) 187 178 202(H) -   LDLCALC <100 mg/dL 132(H) 87 75 95 -   HDL >40 mg/dL 30(L) 27(L) 27(L) 31(L) -   Trlycerides <150  mg/dL 198(H) 365(H) 382(H) 380(H) -   Hemoglobin A1c 4.8 - 5.6 % - - - - 5.7(H)      Capillary Blood Glucose: No results found for: GLUCAP   Exercise Target Goals: Exercise Program Goal: Individual exercise prescription set using results from initial 6 min walk test and THRR while considering  patient's activity barriers and safety.   Exercise Prescription Goal: Starting with aerobic activity 30 plus minutes a day, 3 days per week for initial exercise prescription. Provide home exercise prescription and guidelines that participant acknowledges understanding prior to discharge.  Activity Barriers & Risk Stratification: Activity Barriers &  Cardiac Risk Stratification - 06/23/19 1337      Activity Barriers & Cardiac Risk Stratification   Activity Barriers  Back Problems;Shortness of Breath;Muscular Weakness    Cardiac Risk Stratification  High       6 Minute Walk: 6 Minute Walk    Row Name 06/23/19 1336         6 Minute Walk   Phase  Initial     Distance  1200 feet     Walk Time  6 minutes     # of Rest Breaks  0     MPH  2.27     METS  2.74     RPE  13     Perceived Dyspnea   14     VO2 Peak  15.05     Symptoms  No     Resting HR  71 bpm     Resting BP  130/90     Resting Oxygen Saturation   97 %     Exercise Oxygen Saturation  during 6 min walk  97 %     Max Ex. HR  85 bpm     Max Ex. BP  152/84     2 Minute Post BP  132/88        Oxygen Initial Assessment:   Oxygen Re-Evaluation:   Oxygen Discharge (Final Oxygen Re-Evaluation):   Initial Exercise Prescription: Initial Exercise Prescription - 06/23/19 1300      Date of Initial Exercise RX and Referring Provider   Date  06/23/19    Referring Provider  [place]    Expected Discharge Date  09/22/19      Treadmill   MPH  2    Grade  0    Minutes  17    METs  2.53      NuStep   Level  1    SPM  80    Minutes  17    METs  1.5      Prescription Details   Frequency (times per week)  3    Duration  Progress to 30 minutes of continuous aerobic without signs/symptoms of physical distress      Intensity   THRR 40-80% of Max Heartrate  [phone]    Ratings of Perceived Exertion  11-15    Perceived Dyspnea  0-4      Progression   Progression  Continue to progress workloads to maintain intensity without signs/symptoms of physical distress.      Resistance Training   Training Prescription  Yes    Weight  1    Reps  10-15       Perform Capillary Blood Glucose checks as needed.  Exercise Prescription Changes:  Exercise Prescription Changes    Row Name 06/23/19 1300 07/15/19 1200 07/31/19 1000         Response to Exercise  Blood Pressure (Admit)  130/90  128/82  110/84     Blood Pressure (Exercise)  152/84  144/80  130/88     Blood Pressure (Exit)  132/88  118/80  128/90     Heart Rate (Admit)  71 bpm  70 bpm  72 bpm     Heart Rate (Exercise)  85 bpm  96 bpm  104 bpm     Heart Rate (Exit)  77 bpm  79 bpm  91 bpm     Oxygen Saturation (Admit)  97 %  -  -     Oxygen Saturation (Exercise)  97 %  -  -     Oxygen Saturation (Exit)  98 %  -  -     Rating of Perceived Exertion (Exercise)  13  12  14      Perceived Dyspnea (Exercise)  14  -  -     Symptoms  SOB  -  -     Comments  6MWT  first two weeks of exercise   increase in overall MET leve l     Duration  Progress to 30 minutes of  aerobic without signs/symptoms of physical distress  Continue with 30 min of aerobic exercise without signs/symptoms of physical distress.  Continue with 30 min of aerobic exercise without signs/symptoms of physical distress.     Intensity  THRR New [phone]  THRR unchanged  THRR unchanged       Resistance Training   Training Prescription  -  Yes  Yes     Weight  -  3  3     Reps  -  10-15  10-15       Treadmill   MPH  -  2.8  3     Grade  -  0  0     Minutes  -  17  17     METs  -  3.14  3.29       Recumbant Elliptical   Level  -  1  2     RPM  -  72  75     Watts  -  110  109     Minutes  -  22  22     METs  -  3.58  5.8       Home Exercise Plan   Plans to continue exercise at  -  Home (comment)  Home (comment)     Frequency  -  Add 2 additional days to program exercise sessions.  Add 2 additional days to program exercise sessions.     Initial Home Exercises Provided  -  07/15/19  07/15/19        Exercise Comments:  Exercise Comments    Row Name 07/15/19 1246 07/31/19 1029 08/05/19 1536       Exercise Comments  Pt. has attended 7 exercise sessions so far. He has tolerated the exercise with ease, just has some shortness of breath every once in a while. We will continue to monitor and progress as needed.  Pt.  continues to tolerate exercise well. He has been able to handle all increases with ease. He still complains of SOB sometimes but it's not extreme.  Damany continues to do well in Cardiac Rehab. He is able to do all the exercise with ease and has continued to tolerate all increases well. He is still short of breath sometimes but he is able to work through it.  Exercise Goals and Review:  Exercise Goals    Row Name 06/23/19 1340             Exercise Goals   Increase Physical Activity  Yes       Intervention  Provide advice, education, support and counseling about physical activity/exercise needs.;Develop an individualized exercise prescription for aerobic and resistive training based on initial evaluation findings, risk stratification, comorbidities and participant's personal goals.       Expected Outcomes  Short Term: Attend rehab on a regular basis to increase amount of physical activity.;Long Term: Add in home exercise to make exercise part of routine and to increase amount of physical activity.;Long Term: Exercising regularly at least 3-5 days a week.       Increase Strength and Stamina  Yes       Intervention  Provide advice, education, support and counseling about physical activity/exercise needs.;Develop an individualized exercise prescription for aerobic and resistive training based on initial evaluation findings, risk stratification, comorbidities and participant's personal goals.       Expected Outcomes  Short Term: Increase workloads from initial exercise prescription for resistance, speed, and METs.;Short Term: Perform resistance training exercises routinely during rehab and add in resistance training at home;Long Term: Improve cardiorespiratory fitness, muscular endurance and strength as measured by increased METs and functional capacity (6MWT)       Able to understand and use rate of perceived exertion (RPE) scale  Yes       Intervention  Provide education and explanation on how  to use RPE scale       Expected Outcomes  Short Term: Able to use RPE daily in rehab to express subjective intensity level;Long Term:  Able to use RPE to guide intensity level when exercising independently       Able to understand and use Dyspnea scale  Yes       Intervention  Provide education and explanation on how to use Dyspnea scale       Expected Outcomes  Short Term: Able to use Dyspnea scale daily in rehab to express subjective sense of shortness of breath during exertion;Long Term: Able to use Dyspnea scale to guide intensity level when exercising independently       Knowledge and understanding of Target Heart Rate Range (THRR)  Yes       Intervention  Provide education and explanation of THRR including how the numbers were predicted and where they are located for reference       Expected Outcomes  Short Term: Able to state/look up THRR;Long Term: Able to use THRR to govern intensity when exercising independently;Short Term: Able to use daily as guideline for intensity in rehab       Able to check pulse independently  Yes       Intervention  Provide education and demonstration on how to check pulse in carotid and radial arteries.;Review the importance of being able to check your own pulse for safety during independent exercise       Expected Outcomes  Short Term: Able to explain why pulse checking is important during independent exercise;Long Term: Able to check pulse independently and accurately       Understanding of Exercise Prescription  Yes       Intervention  Provide education, explanation, and written materials on patient's individual exercise prescription       Expected Outcomes  Short Term: Able to explain program exercise prescription;Long Term: Able to explain home exercise prescription to exercise independently  Exercise Goals Re-Evaluation : Exercise Goals Re-Evaluation    Row Name 07/15/19 1244 08/05/19 1536           Exercise Goal Re-Evaluation   Exercise  Goals Review  Increase Physical Activity;Increase Strength and Stamina;Able to understand and use rate of perceived exertion (RPE) scale;Able to understand and use Dyspnea scale;Knowledge and understanding of Target Heart Rate Range (THRR);Able to check pulse independently;Understanding of Exercise Prescription  Increase Physical Activity;Increase Strength and Stamina;Able to understand and use rate of perceived exertion (RPE) scale;Able to understand and use Dyspnea scale;Knowledge and understanding of Target Heart Rate Range (THRR);Able to check pulse independently;Understanding of Exercise Prescription      Comments  Pt. is still fairly new to the program. He has exerperience working out from [employer] so he is somewhat fit. He has tolerated all the exercise well so far.  Pt. continues to do well in the program. He has now completed 14 sessions and has done so with ease.      Expected Outcomes  Short: improve SOB Long: get back to doing the things he was before his diagnosis  Short: improve SOB Long: get back to doing the things he was before his diagnosis          Discharge Exercise Prescription (Final Exercise Prescription Changes): Exercise Prescription Changes - 07/31/19 1000      Response to Exercise   Blood Pressure (Admit)  110/84    Blood Pressure (Exercise)  130/88    Blood Pressure (Exit)  128/90    Heart Rate (Admit)  72 bpm    Heart Rate (Exercise)  104 bpm    Heart Rate (Exit)  91 bpm    Rating of Perceived Exertion (Exercise)  14    Comments  increase in overall MET leve l    Duration  Continue with 30 min of aerobic exercise without signs/symptoms of physical distress.    Intensity  THRR unchanged      Resistance Training   Training Prescription  Yes    Weight  3    Reps  10-15      Treadmill   MPH  3    Grade  0    Minutes  17    METs  3.29      Recumbant Elliptical   Level  2    RPM  75    Watts  109    Minutes  22    METs  5.8      Home Exercise Plan    Plans to continue exercise at  Home (comment)    Frequency  Add 2 additional days to program exercise sessions.    Initial Home Exercises Provided  07/15/19       Nutrition:  Target Goals: Understanding of nutrition guidelines, daily intake of sodium <1562m, cholesterol <2085m calories 30% from fat and 7% or less from saturated fats, daily to have 5 or more servings of fruits and vegetables.  Biometrics: Pre Biometrics - 06/23/19 1340      Pre Biometrics   Height  5' 8"  (1.727 m)    Waist Circumference  37.5 inches    Hip Circumference  39 inches    Waist to Hip Ratio  0.96 %    Triceps Skinfold  6 mm    % Body Fat  22.1 %    Grip Strength  2.3 kg   kg   Flexibility  0 in   lower back pain due to cartilage issues and fusion.  Single Leg Stand  60 seconds        Nutrition Therapy Plan and Nutrition Goals: Nutrition Therapy & Goals - 08/06/19 1617      Personal Nutrition Goals   Comments  We have not resumed our RD classes post COVID. We are working with a new dietician to resume these classes. In the interim, we are providing education through hand-outs. Patient says he is eating less greasy foods and more grilled foods.  Will continue to monitor for progress.      Intervention Plan   Intervention  Nutrition handout(s) given to patient.    Expected Outcomes  Short Term Goal: Understand basic principles of dietary content, such as calories, fat, sodium, cholesterol and nutrients.       Nutrition Assessments: Nutrition Assessments - 06/23/19 1454      MEDFICTS Scores   Pre Score  42       Nutrition Goals Re-Evaluation:   Nutrition Goals Discharge (Final Nutrition Goals Re-Evaluation):   Psychosocial: Target Goals: Acknowledge presence or absence of significant depression and/or stress, maximize coping skills, provide positive support system. Participant is able to verbalize types and ability to use techniques and skills needed for reducing stress and  depression.  Initial Review & Psychosocial Screening: Initial Psych Review & Screening - 06/23/19 1452      Initial Review   Current issues with  History of Depression;Current Anxiety/Panic;Current Stress Concerns    Source of Stress Concerns  None Identified    Comments  PTSD      Family Dynamics   Good Support System?  Yes      Barriers   Psychosocial barriers to participate in program  The patient should benefit from training in stress management and relaxation.      Screening Interventions   Interventions  Encouraged to exercise    Expected Outcomes  Short Term goal: Identification and review with participant of any Quality of Life or Depression concerns found by scoring the questionnaire.;Long Term goal: The participant improves quality of Life and PHQ9 Scores as seen by post scores and/or verbalization of changes       Quality of Life Scores: Quality of Life - 06/23/19 1341      Quality of Life   Select  Quality of Life      Quality of Life Scores   Health/Function Pre  10.1 %    Socioeconomic Pre  19.38 %    Psych/Spiritual Pre  18.79 %    Family Pre  12 %    GLOBAL Pre  14.23 %      Scores of 19 and below usually indicate a poorer quality of life in these areas.  A difference of  2-3 points is a clinically meaningful difference.  A difference of 2-3 points in the total score of the Quality of Life Index has been associated with significant improvement in overall quality of life, self-image, physical symptoms, and general health in studies assessing change in quality of life.  PHQ-9: Recent Review Flowsheet Data    Depression screen [facility] 2/9 06/23/2019 06/06/2016 12/16/2015 11/29/2015   Decreased Interest 2 0 0 0   Down, Depressed, Hopeless 1 0 0 0   PHQ - 2 Score 3 0 0 0   Altered sleeping 2 - - -   Tired, decreased energy 3 - - -   Change in appetite 1 - - -   Feeling bad or failure about yourself  2 - - -   Trouble concentrating 2 - - -  Moving slowly or  fidgety/restless 3 - - -   Suicidal thoughts 2 - - -   PHQ-9 Score 18 - - -   Difficult doing work/chores Extremely dIfficult - - -     Interpretation of Total Score  Total Score Depression Severity:  1-4 = Minimal depression, 5-9 = Mild depression, 10-14 = Moderate depression, 15-19 = Moderately severe depression, 20-27 = Severe depression   Psychosocial Evaluation and Intervention: Psychosocial Evaluation - 06/23/19 1453      Psychosocial Evaluation & Interventions   Interventions  Encouraged to exercise with the program and follow exercise prescription    Continue Psychosocial Services   Follow up required by staff       Psychosocial Re-Evaluation: Psychosocial Re-Evaluation    [place] Name 07/15/19 1013 08/06/19 1623           Psychosocial Re-Evaluation   Current issues with  History of Depression;Current Anxiety/Panic  -      Comments  Patinet's intiial QOL score was 14.23 and his PHQ-9 score was 18. He does suffer from PTSD from his service in the [employer]. His depression is managed with Citalopram 10 mg daily and he is in counseling. He feels his psychosocial issues are managed. Will continue to monitor for progress.  Patinet's intiial QOL score was 14.23 and his PHQ-9 score was 18. He does suffer from PTSD from his service in the [employer]. His depression is managed with Citalopram 10 mg daily and he is in counseling. He feels his psychosocial issues are managed. Will continue to monitor for progress.      Expected Outcomes  Patient will have improved QOL and PHQ-9 scores at discharage and his psychosocial issues will continue to be managed with no additional issues identified.  Patient will have improved QOL and PHQ-9 scores at discharage and his psychosocial issues will continue to be managed with no additional issues identified.      Interventions  Stress management education;Encouraged to attend Cardiac Rehabilitation for the exercise;Relaxation education  Stress management  education;Encouraged to attend Cardiac Rehabilitation for the exercise;Relaxation education      Continue Psychosocial Services   Follow up required by staff  Follow up required by staff         Psychosocial Discharge (Final Psychosocial Re-Evaluation): Psychosocial Re-Evaluation - 08/06/19 1623      Psychosocial Re-Evaluation   Comments  Patinet's intiial QOL score was 14.23 and his PHQ-9 score was 18. He does suffer from PTSD from his service in the [employer]. His depression is managed with Citalopram 10 mg daily and he is in counseling. He feels his psychosocial issues are managed. Will continue to monitor for progress.    Expected Outcomes  Patient will have improved QOL and PHQ-9 scores at discharage and his psychosocial issues will continue to be managed with no additional issues identified.    Interventions  Stress management education;Encouraged to attend Cardiac Rehabilitation for the exercise;Relaxation education    Continue Psychosocial Services   Follow up required by staff       Vocational Rehabilitation: Provide vocational rehab assistance to qualifying candidates.   Vocational Rehab Evaluation & Intervention: Vocational Rehab - 06/23/19 1455      Initial Vocational Rehab Evaluation & Intervention   Assessment shows need for Vocational Rehabilitation  No       Education: Education Goals: Education classes will be provided on a weekly basis, covering required topics. Participant will state understanding/return demonstration of topics presented.  Learning Barriers/Preferences: Learning Barriers/Preferences - 06/23/19 1455  Learning Barriers/Preferences   Learning Barriers  None    Learning Preferences  Video;Written Material;Verbal Instruction;Individual Instruction;Group Instruction       Education Topics: Hypertension, Hypertension Reduction -Define heart disease and high blood pressure. Discus how high blood pressure affects the body and ways to reduce high  blood pressure.   CARDIAC REHAB PHASE II EXERCISE from 07/30/2019 in [place]  Date  07/30/19  Educator  DC  Instruction Review Code  2- Demonstrated Understanding      Exercise and Your Heart -Discuss why it is important to exercise, the FITT principles of exercise, normal and abnormal responses to exercise, and how to exercise safely.   Angina -Discuss definition of angina, causes of angina, treatment of angina, and how to decrease risk of having angina.   Cardiac Medications -Review what the following cardiac medications are used for, how they affect the body, and side effects that may occur when taking the medications.  Medications include Aspirin, Beta blockers, calcium channel blockers, ACE Inhibitors, angiotensin receptor blockers, diuretics, digoxin, and antihyperlipidemics.   Congestive Heart Failure -Discuss the definition of CHF, how to live with CHF, the signs and symptoms of CHF, and how keep track of weight and sodium intake.   Heart Disease and Intimacy -Discus the effect sexual activity has on the heart, how changes occur during intimacy as we age, and safety during sexual activity.   Smoking Cessation / COPD -Discuss different methods to quit smoking, the health benefits of quitting smoking, and the definition of COPD.   Nutrition I: Fats -Discuss the types of cholesterol, what cholesterol does to the heart, and how cholesterol levels can be controlled.   Nutrition II: Labels -Discuss the different components of food labels and how to read food label   CARDIAC REHAB PHASE II EXERCISE from 07/30/2019 in [place]  Date  06/25/19  Educator  DC  Instruction Review Code  1- Verbalizes Understanding      Heart Parts/Heart Disease and PAD -Discuss the anatomy of the heart, the pathway of blood circulation through the heart, and these are affected by heart disease.   CARDIAC REHAB PHASE II EXERCISE from 07/30/2019  in [place]  Date  07/02/19  Educator  DJ  Instruction Review Code  2- Demonstrated Understanding      Stress I: Signs and Symptoms -Discuss the causes of stress, how stress may lead to anxiety and depression, and ways to limit stress.   Stress II: Relaxation -Discuss different types of relaxation techniques to limit stress.   CARDIAC REHAB PHASE II EXERCISE from 07/30/2019 in [place]  Date  07/16/19  Educator  Etheleen Mayhew  Instruction Review Code  2- Demonstrated Understanding      Warning Signs of Stroke / TIA -Discuss definition of a stroke, what the signs and symptoms are of a stroke, and how to identify when someone is having stroke.   Knowledge Questionnaire Score: Knowledge Questionnaire Score - 06/23/19 1455      Knowledge Questionnaire Score   Pre Score  24/28       Core Components/Risk Factors/Patient Goals at Admission: Personal Goals and Risk Factors at Admission - 06/23/19 1455      Core Components/Risk Factors/Patient Goals on Admission    Weight Management  Weight Maintenance    Personal Goal Other  Yes    Personal Goal  GEt rid of SOB, Be able to do things easier, do more things with grandchildren.  Intervention  Attend CR 3 x times week and supplement with at home exercise 2 x week.    Expected Outcomes  Reach expected goals.       Core Components/Risk Factors/Patient Goals Review:  Goals and Risk Factor Review    Row Name 07/15/19 1011 08/06/19 1618           Core Components/Risk Factors/Patient Goals Review   Personal Goals Review  Weight Management/Obesity;Other;Improve shortness of breath with ADL's No SOB; get things done easier; do things with grandchildren.  Weight Management/Obesity;Other;Improve shortness of breath with ADL's No SOB; Get thing done easier; Do things with grandchildren.      Review  Patient has completed 7 sessions maintaining his weight since last 30 day review. He is  doing well in the program with progression. He says he is breathing better and is now not afraid to walk and exercise some on his own. He does continue to smoke 1 pack/day cigerattes. He says he is not trying to quit at this time. He is working toward meeting his personal goals. Will continue to monitor for progress.  Patient has completed 14 sessions losing 1 lb in past 30 days. He continues to do well in the program with continued progression. He continues to smoke cigerattes 1 p/day and expresses on interest in quitting. He reports feeling less angina and does feel stronger. He says he thinks his SOB is worsening and has talked to his cardiologist about this. He says he is doing more around the house and [place] to go hunting with his grandson soon. She ordered a chest x-ray which he completed but has not heard about the results. Will continue to monitor for progress.      Expected Outcomes  Patient will continue to attend sessions meeting his personal goals.  Patient will continue to attend sessions meeting his personal goals.         Core Components/Risk Factors/Patient Goals at Discharge (Final Review):  Goals and Risk Factor Review - 08/06/19 1618      Core Components/Risk Factors/Patient Goals Review   Personal Goals Review  Weight Management/Obesity;Other;Improve shortness of breath with ADL's   No SOB; Get thing done easier; Do things with grandchildren.   Review  Patient has completed 14 sessions losing 1 lb in past 30 days. He continues to do well in the program with continued progression. He continues to smoke cigerattes 1 p/day and expresses on interest in quitting. He reports feeling less angina and does feel stronger. He says he thinks his SOB is worsening and has talked to his cardiologist about this. He says he is doing more around the house and [place] to go hunting with his grandson soon. She ordered a chest x-ray which he completed but has not heard about the results. Will continue to  monitor for progress.    Expected Outcomes  Patient will continue to attend sessions meeting his personal goals.       ITP Comments: ITP Comments    Row Name 06/23/19 1443           ITP Comments  Patient has PTSD. His triggers are loud noises, people behind him, not having his back to a wall, un eased when people enter the room with out introduction. He is otherwise mild temperment.          Comments: ITP REVIEW Pt is making expected progress toward Cardiac Rehab goals after completing 14 sessions. Recommend continued exercise, life style modification, education, and increased stamina and  strength.

## 2019-08-08 ENCOUNTER — Other Ambulatory Visit: Payer: Self-pay

## 2019-08-08 ENCOUNTER — Encounter (HOSPITAL_COMMUNITY)
Admission: RE | Admit: 2019-08-08 | Discharge: 2019-08-08 | Disposition: A | Discharge status: Home / Self Care | Payer: No Typology Code available for payment source | Source: Ambulatory Visit | Attending: Internal Medicine | Admitting: Internal Medicine

## 2019-08-08 DIAGNOSIS — I301 Infective pericarditis: Secondary | ICD-10-CM | POA: Diagnosis not present

## 2019-08-08 NOTE — Progress Notes (Signed)
Daily Session Note  Patient Details  Name: Jason Sheppard MRN: [id number] Date of Birth: 05/27/1981 Referring Provider:     [place] from 06/23/2019 in [place]  Referring Provider  [place]      Encounter Date: 08/08/2019  Check In: Session Check In - 08/08/19 1430      Check-In   Supervising physician immediately available to respond to emergencies  See telemetry face sheet for immediately available MD    Location  [facility]    Staff Present  Russella Dar, MS, EP, [facility], Exercise Physiologist;Jakaila Norment Zachery Conch, Exercise Physiologist    Virtual Visit  No    Medication changes reported      No    Fall or balance concerns reported     No    Tobacco Cessation  No Change    Warm-up and Cool-down  Performed as group-led instruction    Resistance Training Performed  Yes    VAD Patient?  No    PAD/SET Patient?  No      Pain Assessment   Currently in Pain?  No/denies    Pain Score  0-No pain    Multiple Pain Sites  No       Capillary Blood Glucose: No results found for this or any previous visit (from the past 24 hour(s)).    Social History   Tobacco Use  Smoking Status Current Every Day Smoker  . Packs/day: 1.00  . Years: 14.00  . Pack years: 14.00  . Types: Cigarettes  . Last attempt to quit: 10/23/2005  . Years since quitting: 13.8  Smokeless Tobacco Never Used    Goals Met:  Independence with exercise equipment Exercise tolerated well No report of cardiac concerns or symptoms Strength training completed today  Goals Unmet:  Not Applicable  Comments: Pt able to follow exercise prescription today without complaint.  Will continue to monitor for progression. Check out 1530.   Dr. Kate Sable is Medical Director for [facility] Cardiac and Pulmonary Rehab.

## 2019-08-11 ENCOUNTER — Encounter (HOSPITAL_COMMUNITY): Payer: No Typology Code available for payment source

## 2019-08-13 ENCOUNTER — Encounter (HOSPITAL_COMMUNITY)
Admission: RE | Admit: 2019-08-13 | Discharge: 2019-08-13 | Disposition: A | Discharge status: Home / Self Care | Payer: No Typology Code available for payment source | Source: Ambulatory Visit | Attending: Internal Medicine | Admitting: Internal Medicine

## 2019-08-13 ENCOUNTER — Other Ambulatory Visit: Payer: Self-pay

## 2019-08-13 DIAGNOSIS — I301 Infective pericarditis: Secondary | ICD-10-CM

## 2019-08-13 NOTE — Progress Notes (Signed)
Daily Session Note  Patient Details  Name: Jason Sheppard MRN: [id number] Date of Birth: [birth year] Referring Provider:     [place] from 06/23/2019 in [place]  Referring Provider  [place]      Encounter Date: 08/13/2019  Check In: Session Check In - 08/13/19 1430      Check-In   Supervising physician immediately available to respond to emergencies  See telemetry face sheet for immediately available MD    Location  [facility]    Staff Present  Russella Dar, MS, EP, [facility], Exercise Physiologist;Bronwyn Belasco Zachery Conch, Exercise Physiologist    Virtual Visit  No    Medication changes reported      No    Fall or balance concerns reported     No    Tobacco Cessation  No Change    Warm-up and Cool-down  Performed as group-led instruction    Resistance Training Performed  Yes    VAD Patient?  No    PAD/SET Patient?  No      Pain Assessment   Currently in Pain?  No/denies    Pain Score  0-No pain    Multiple Pain Sites  No       Capillary Blood Glucose: No results found for this or any previous visit (from the past 24 hour(s)).    Social History   Tobacco Use  Smoking Status Current Every Day Smoker  . Packs/day: 1.00  . Years: 14.00  . Pack years: 14.00  . Types: Cigarettes  . Last attempt to quit: 10/23/2005  . Years since quitting: 13.8  Smokeless Tobacco Never Used    Goals Met:  Independence with exercise equipment Exercise tolerated well No report of cardiac concerns or symptoms Strength training completed today  Goals Unmet:  Not Applicable  Comments: Pt able to follow exercise prescription today without complaint.  Will continue to monitor for progression. Check out 1530.   Dr. Kate Sable is Medical Director for [facility] Cardiac and Pulmonary Rehab.

## 2019-08-15 ENCOUNTER — Encounter (HOSPITAL_COMMUNITY)
Admission: RE | Admit: 2019-08-15 | Discharge: 2019-08-15 | Disposition: A | Discharge status: Home / Self Care | Payer: No Typology Code available for payment source | Source: Ambulatory Visit | Attending: Internal Medicine | Admitting: Internal Medicine

## 2019-08-15 ENCOUNTER — Other Ambulatory Visit: Payer: Self-pay

## 2019-08-15 DIAGNOSIS — I301 Infective pericarditis: Secondary | ICD-10-CM

## 2019-08-15 NOTE — Progress Notes (Signed)
Daily Session Note  Patient Details  Name: LORI LIEW MRN: [id number] Date of Birth: Jun 29, 1981 Referring Provider:     [place] from 06/23/2019 in [place]  Referring Provider  [place]      Encounter Date: 08/15/2019  Check In: Session Check In - 08/15/19 1430      Check-In   Supervising physician immediately available to respond to emergencies  See telemetry face sheet for immediately available MD    Location  [facility]    Staff Present  Russella Dar, MS, EP, [facility], Exercise Physiologist;Amanda Ballard, Exercise Physiologist;Joaopedro Eschbach Wynetta Emery, RN, BSN    Virtual Visit  No    Medication changes reported      No    Fall or balance concerns reported     No    Tobacco Cessation  No Change    Warm-up and Cool-down  Performed as group-led instruction    Resistance Training Performed  Yes    VAD Patient?  No    PAD/SET Patient?  No      Pain Assessment   Currently in Pain?  No/denies    Pain Score  0-No pain    Multiple Pain Sites  No       Capillary Blood Glucose: No results found for this or any previous visit (from the past 24 hour(s)).    Social History   Tobacco Use  Smoking Status Current Every Day Smoker  . Packs/day: 1.00  . Years: 14.00  . Pack years: 14.00  . Types: Cigarettes  . Last attempt to quit: 10/23/2005  . Years since quitting: 13.8  Smokeless Tobacco Never Used    Goals Met:  Independence with exercise equipment Exercise tolerated well No report of cardiac concerns or symptoms Strength training completed today  Goals Unmet:  Not Applicable  Comments: Pt able to follow exercise prescription today without complaint.  Will continue to monitor for progression. Check out 1430.   Dr. Kate Sable is Medical Director for [facility] Cardiac and Pulmonary Rehab.

## 2019-08-18 ENCOUNTER — Encounter (HOSPITAL_COMMUNITY): Payer: No Typology Code available for payment source

## 2019-08-19 ENCOUNTER — Other Ambulatory Visit: Payer: Self-pay

## 2019-08-19 DIAGNOSIS — Z20822 Contact with and (suspected) exposure to covid-19: Secondary | ICD-10-CM

## 2019-08-20 ENCOUNTER — Encounter (HOSPITAL_COMMUNITY): Payer: No Typology Code available for payment source

## 2019-08-21 LAB — NOVEL CORONAVIRUS, NAA: SARS-CoV-2, NAA: NOT DETECTED

## 2019-08-22 ENCOUNTER — Encounter (HOSPITAL_COMMUNITY): Payer: No Typology Code available for payment source

## 2019-08-25 ENCOUNTER — Encounter (HOSPITAL_COMMUNITY): Payer: No Typology Code available for payment source

## 2019-08-27 ENCOUNTER — Encounter (HOSPITAL_COMMUNITY): Payer: No Typology Code available for payment source

## 2019-08-29 ENCOUNTER — Encounter (HOSPITAL_COMMUNITY): Payer: No Typology Code available for payment source

## 2019-09-01 ENCOUNTER — Encounter (HOSPITAL_COMMUNITY): Payer: No Typology Code available for payment source

## 2019-09-03 ENCOUNTER — Encounter (HOSPITAL_COMMUNITY)
Admission: RE | Admit: 2019-09-03 | Discharge: 2019-09-03 | Disposition: A | Discharge status: Home / Self Care | Payer: No Typology Code available for payment source | Source: Ambulatory Visit | Attending: Internal Medicine | Admitting: Internal Medicine

## 2019-09-03 ENCOUNTER — Other Ambulatory Visit: Payer: Self-pay

## 2019-09-03 DIAGNOSIS — I301 Infective pericarditis: Secondary | ICD-10-CM | POA: Diagnosis not present

## 2019-09-03 NOTE — Progress Notes (Signed)
Daily Session Note  Patient Details  Name: AMITAI DELAUGHTER MRN: [id number] Date of Birth: [birth year] Referring Provider:     [place] from 06/23/2019 in [place]  Referring Provider  [place]      Encounter Date: 09/03/2019  Check In: Session Check In - 09/03/19 1545      Check-In   Supervising physician immediately available to respond to emergencies  See telemetry face sheet for immediately available MD    Location  [facility]    Staff Present  Russella Dar, MS, EP, [facility], Exercise Physiologist;Deon Ivey Wynetta Emery, RN, BSN    Virtual Visit  No    Medication changes reported      No    Fall or balance concerns reported     No    Tobacco Cessation  No Change    Warm-up and Cool-down  Performed as group-led instruction    Resistance Training Performed  Yes    VAD Patient?  No    PAD/SET Patient?  No      Pain Assessment   Currently in Pain?  No/denies    Pain Score  0-No pain    Multiple Pain Sites  No       Capillary Blood Glucose: No results found for this or any previous visit (from the past 24 hour(s)).    Social History   Tobacco Use  Smoking Status Current Every Day Smoker  . Packs/day: 1.00  . Years: 14.00  . Pack years: 14.00  . Types: Cigarettes  . Last attempt to quit: 10/23/2005  . Years since quitting: 13.8  Smokeless Tobacco Never Used    Goals Met:  Independence with exercise equipment Exercise tolerated well No report of cardiac concerns or symptoms Strength training completed today  Goals Unmet:  Not Applicable  Comments: Check out 1445.   Dr. Kate Sable is Medical Director for [facility] Cardiac and Pulmonary Rehab.

## 2019-09-04 NOTE — Progress Notes (Signed)
Cardiac Individual Treatment Plan  Patient Details  Name: Jason Sheppard MRN: [id number] Date of Birth: 09-10-1981 Referring Provider:     [place] from 06/23/2019 in [place]  Referring Provider  [place]      Initial Encounter Date:    CARDIAC REHAB PHASE II ORIENTATION from 06/23/2019 in [place]  Date  06/23/19      Visit Diagnosis: Infectious pericarditis, unspecified chronicity, unspecified infectious etiology  Patient's Home Medications on Admission:  Current Outpatient Medications:  .  bictegravir-emtricitabine-tenofovir AF (BIKTARVY) 50-200-25 MG TABS tablet, Take 1 tablet by mouth daily., Disp: , Rfl:  .  citalopram (CELEXA) 10 MG tablet, Take 20 mg by mouth daily. , Disp: , Rfl:  .  colchicine 0.6 MG tablet, Take 0.6 mg by mouth 2 (two) times daily with a meal. , Disp: , Rfl:  .  hydrOXYzine (VISTARIL) 25 MG capsule, Take 25 mg by mouth every 6 (six) hours as needed (extreme anxiety)., Disp: , Rfl:  .  ibuprofen (ADVIL) 400 MG tablet, Take 1 tablet (400 mg total) by mouth 3 (three) times daily., Disp: 15 tablet, Rfl: 0 .  levETIRAcetam (KEPPRA) 250 MG tablet, Take 125 mg by mouth 2 (two) times daily. Tapering off keppra while initiating oxcarbazepine. Currently at 1/2 tab twice daily, Disp: , Rfl:  .  lisinopril (ZESTRIL) 5 MG tablet, Take 5 mg by mouth daily., Disp: , Rfl:  .  montelukast (SINGULAIR) 10 MG tablet, Take 1 tablet (10 mg total) by mouth every morning., Disp: , Rfl:  .  nitroGLYCERIN (NITROSTAT) 0.4 MG SL tablet, Place 1 tablet (0.4 mg total) under the tongue every 5 (five) minutes as needed for chest pain., Disp: 30 tablet, Rfl: 0 .  ondansetron (ZOFRAN) 4 MG tablet, Take 1 tablet (4 mg total) by mouth every 6 (six) hours as needed for nausea. (Patient not taking: Reported on 06/23/2019), Disp: 20 tablet, Rfl: 0 .  OXcarbazepine (TRILEPTAL) 150 MG tablet, Take 300 mg by mouth 2 (two) times  daily. End goal of 300 mg BID. Patient currently working on increasing over a month with dose increase every week, Disp: , Rfl:  .  pantoprazole (PROTONIX) 40 MG tablet, Take 1 tablet (40 mg total) by mouth daily., Disp: 5 tablet, Rfl: 0 .  prazosin (MINIPRESS) 2 MG capsule, Take 2 mg by mouth at bedtime., Disp: , Rfl:   Past Medical History: Past Medical History:  Diagnosis Date  . Allergy   . Attention deficit disorder with hyperactivity   . Depression   . HIV (human immunodeficiency virus infection) ([place])   . Infective pericarditis   . Migraine   . OSA (obstructive sleep apnea)   . Plantar fascial fibromatosis   . PTSD (post-traumatic stress disorder)   . Seizure disorder ([place])     Tobacco Use: Social History   Tobacco Use  Smoking Status Current Every Day Smoker  . Packs/day: 1.00  . Years: 14.00  . Pack years: 14.00  . Types: Cigarettes  . Last attempt to quit: 10/23/2005  . Years since quitting: 13.8  Smokeless Tobacco Never Used    Labs: Recent Chemical engineer    Labs for ITP Cardiac and Pulmonary Rehab Latest Ref Rng & Units 12/07/2015 05/23/2016 11/28/2016 05/09/2017 02/25/2019   Cholestrol <200 mg/dL 202(H) 187 178 202(H) -   LDLCALC <100 mg/dL 132(H) 87 75 95 -   HDL >40 mg/dL 30(L) 27(L) 27(L) 31(L) -   Trlycerides <150  mg/dL 198(H) 365(H) 382(H) 380(H) -   Hemoglobin A1c 4.8 - 5.6 % - - - - 5.7(H)      Capillary Blood Glucose: No results found for: GLUCAP   Exercise Target Goals: Exercise Program Goal: Individual exercise prescription set using results from initial 6 min walk test and THRR while considering  patient's activity barriers and safety.   Exercise Prescription Goal: Starting with aerobic activity 30 plus minutes a day, 3 days per week for initial exercise prescription. Provide home exercise prescription and guidelines that participant acknowledges understanding prior to discharge.  Activity Barriers & Risk Stratification: Activity Barriers &  Cardiac Risk Stratification - 06/23/19 1337      Activity Barriers & Cardiac Risk Stratification   Activity Barriers  Back Problems;Shortness of Breath;Muscular Weakness    Cardiac Risk Stratification  High       6 Minute Walk: 6 Minute Walk    Row Name 06/23/19 1336         6 Minute Walk   Phase  Initial     Distance  1200 feet     Walk Time  6 minutes     # of Rest Breaks  0     MPH  2.27     METS  2.74     RPE  13     Perceived Dyspnea   14     VO2 Peak  15.05     Symptoms  No     Resting HR  71 bpm     Resting BP  130/90     Resting Oxygen Saturation   97 %     Exercise Oxygen Saturation  during 6 min walk  97 %     Max Ex. HR  85 bpm     Max Ex. BP  152/84     2 Minute Post BP  132/88        Oxygen Initial Assessment:   Oxygen Re-Evaluation:   Oxygen Discharge (Final Oxygen Re-Evaluation):   Initial Exercise Prescription: Initial Exercise Prescription - 06/23/19 1300      Date of Initial Exercise RX and Referring Provider   Date  06/23/19    Referring Provider  [place]    Expected Discharge Date  09/22/19      Treadmill   MPH  2    Grade  0    Minutes  17    METs  2.53      NuStep   Level  1    SPM  80    Minutes  17    METs  1.5      Prescription Details   Frequency (times per week)  3    Duration  Progress to 30 minutes of continuous aerobic without signs/symptoms of physical distress      Intensity   THRR 40-80% of Max Heartrate  [phone]    Ratings of Perceived Exertion  11-15    Perceived Dyspnea  0-4      Progression   Progression  Continue to progress workloads to maintain intensity without signs/symptoms of physical distress.      Resistance Training   Training Prescription  Yes    Weight  1    Reps  10-15       Perform Capillary Blood Glucose checks as needed.  Exercise Prescription Changes:  Exercise Prescription Changes    Row Name 06/23/19 1300 07/15/19 1200 07/31/19 1000 08/14/19 1200 09/04/19 0900      Response to Exercise  Blood Pressure (Admit)  130/90  128/82  110/84  150/82  130/70   Blood Pressure (Exercise)  152/84  144/80  130/88  160/72  128/88   Blood Pressure (Exit)  132/88  118/80  128/90  122/80  120/84   Heart Rate (Admit)  71 bpm  70 bpm  72 bpm  63 bpm  76 bpm   Heart Rate (Exercise)  85 bpm  96 bpm  104 bpm  104 bpm  102 bpm   Heart Rate (Exit)  77 bpm  79 bpm  91 bpm  71 bpm  85 bpm   Oxygen Saturation (Admit)  97 %  -  -  -  -   Oxygen Saturation (Exercise)  97 %  -  -  -  -   Oxygen Saturation (Exit)  98 %  -  -  -  -   Rating of Perceived Exertion (Exercise)  _0 Perceived Dyspnea (Exercise)  14  -  -  -  -   Symptoms  SOB  -  -  SOB  SOB   Comments  6MWT  first two weeks of exercise   increase in overall MET leve l  -  -   Duration  Progress to 30 minutes of  aerobic without signs/symptoms of physical distress  Continue with 30 min of aerobic exercise without signs/symptoms of physical distress.  Continue with 30 min of aerobic exercise without signs/symptoms of physical distress.  Continue with 30 min of aerobic exercise without signs/symptoms of physical distress.  Continue with 30 min of aerobic exercise without signs/symptoms of physical distress.   Intensity  THRR New 114-136-158  THRR unchanged  THRR unchanged  THRR unchanged  THRR unchanged     Progression   Progression  -  -  -  Continue to progress workloads to maintain intensity without signs/symptoms of physical distress.  Continue to progress workloads to maintain intensity without signs/symptoms of physical distress.   Average METs  -  -  -  3.39  3.29     Resistance Training   Training Prescription  -  Yes  Yes  Yes  Yes   Weight  -  _1 Reps  -  10-15  10-15  10-15  10-15     Treadmill   MPH  -  2._2 3.2   Grade  -  0  0  0  0.5   Minutes  -  _3 METs  -  3.14  3.29  3.29  3.66     Recumbant Elliptical   Level  -  _4 RPM  -  72  75  51  68    Watts  -  110  109  62  93   Minutes  -  _5 METs  -  3.58  5.8  3.5  5.2     Home Exercise Plan   Plans to continue exercise at  -  Home (comment)  Home (comment)  Home (comment)  Home (comment)   Frequency  -  Add 2 additional days to program exercise sessions.  Add 2 additional days to program exercise sessions.  Add 2 additional days to program exercise sessions.  Add 2 additional days  to program exercise sessions.   Initial Home Exercises Provided  -  07/15/19  07/15/19  07/15/19  07/15/19      Exercise Comments:  Exercise Comments    Row Name 07/15/19 1246 07/31/19 1029 08/05/19 1536 08/14/19 1245 09/04/19 1012   Exercise Comments  Pt. has attended 7 exercise sessions so far. He has tolerated the exercise with ease, just has some shortness of breath every once in a while. We will continue to monitor and progress as needed.  Pt. continues to tolerate exercise well. He has been able to handle all increases with ease. He still complains of SOB sometimes but it's not extreme.  Brecken continues to do well in Cardiac Rehab. He is able to do all the exercise with ease and has continued to tolerate all increases well. He is still short of breath sometimes but he is able to work through it.  Pt. continues to do well in CR when he's here. He is suffering from some SOB, pretty much at all time right now. We will keep an eye on that and progress as tolerated.  When patient attends he soes well with his exericse prescription. We will continue to progress him as tolerated.      Exercise Goals and Review:  Exercise Goals    Row Name 06/23/19 1340             Exercise Goals   Increase Physical Activity  Yes       Intervention  Provide advice, education, support and counseling about physical activity/exercise needs.;Develop an individualized exercise prescription for aerobic and resistive training based on initial evaluation findings, risk stratification, comorbidities and  participant's personal goals.       Expected Outcomes  Short Term: Attend rehab on a regular basis to increase amount of physical activity.;Long Term: Add in home exercise to make exercise part of routine and to increase amount of physical activity.;Long Term: Exercising regularly at least 3-5 days a week.       Increase Strength and Stamina  Yes       Intervention  Provide advice, education, support and counseling about physical activity/exercise needs.;Develop an individualized exercise prescription for aerobic and resistive training based on initial evaluation findings, risk stratification, comorbidities and participant's personal goals.       Expected Outcomes  Short Term: Increase workloads from initial exercise prescription for resistance, speed, and METs.;Short Term: Perform resistance training exercises routinely during rehab and add in resistance training at home;Long Term: Improve cardiorespiratory fitness, muscular endurance and strength as measured by increased METs and functional capacity (6MWT)       Able to understand and use rate of perceived exertion (RPE) scale  Yes       Intervention  Provide education and explanation on how to use RPE scale       Expected Outcomes  Short Term: Able to use RPE daily in rehab to express subjective intensity level;Long Term:  Able to use RPE to guide intensity level when exercising independently       Able to understand and use Dyspnea scale  Yes       Intervention  Provide education and explanation on how to use Dyspnea scale       Expected Outcomes  Short Term: Able to use Dyspnea scale daily in rehab to express subjective sense of shortness of breath during exertion;Long Term: Able to use Dyspnea scale to guide intensity level when exercising independently       Knowledge and understanding  of Target Heart Rate Range (THRR)  Yes       Intervention  Provide education and explanation of THRR including how the numbers were predicted and where they are  located for reference       Expected Outcomes  Short Term: Able to state/look up THRR;Long Term: Able to use THRR to govern intensity when exercising independently;Short Term: Able to use daily as guideline for intensity in rehab       Able to check pulse independently  Yes       Intervention  Provide education and demonstration on how to check pulse in carotid and radial arteries.;Review the importance of being able to check your own pulse for safety during independent exercise       Expected Outcomes  Short Term: Able to explain why pulse checking is important during independent exercise;Long Term: Able to check pulse independently and accurately       Understanding of Exercise Prescription  Yes       Intervention  Provide education, explanation, and written materials on patient's individual exercise prescription       Expected Outcomes  Short Term: Able to explain program exercise prescription;Long Term: Able to explain home exercise prescription to exercise independently          Exercise Goals Re-Evaluation : Exercise Goals Re-Evaluation    Row Name 07/15/19 1244 08/05/19 1536 09/04/19 1005         Exercise Goal Re-Evaluation   Exercise Goals Review  Increase Physical Activity;Increase Strength and Stamina;Able to understand and use rate of perceived exertion (RPE) scale;Able to understand and use Dyspnea scale;Knowledge and understanding of Target Heart Rate Range (THRR);Able to check pulse independently;Understanding of Exercise Prescription  Increase Physical Activity;Increase Strength and Stamina;Able to understand and use rate of perceived exertion (RPE) scale;Able to understand and use Dyspnea scale;Knowledge and understanding of Target Heart Rate Range (THRR);Able to check pulse independently;Understanding of Exercise Prescription  Increase Physical Activity;Increase Strength and Stamina;Knowledge and understanding of Target Heart Rate Range (THRR);Able to check pulse  independently;Understanding of Exercise Prescription     Comments  Pt. is still fairly new to the program. He has exerperience working out from [employer] so he is somewhat fit. He has tolerated all the exercise well so far.  Pt. continues to do well in the program. He has now completed 14 sessions and has done so with ease.  Patient is still attending. He does not come consistently due to family issues and his PTSD. His golas are to get rid of his SOB and be able to do things easier and to be able to do things with his grand-children. He is still experiences SOB while exercising. We will still progress him as he can tolerate.     Expected Outcomes  Short: improve SOB Long: get back to doing the things he was before his diagnosis  Short: improve SOB Long: get back to doing the things he was before his diagnosis  To achieve his goals stated above.         Discharge Exercise Prescription (Final Exercise Prescription Changes): Exercise Prescription Changes - 09/04/19 0900      Response to Exercise   Blood Pressure (Admit)  130/70    Blood Pressure (Exercise)  128/88    Blood Pressure (Exit)  120/84    Heart Rate (Admit)  76 bpm    Heart Rate (Exercise)  102 bpm    Heart Rate (Exit)  85 bpm    Rating of Perceived  Exertion (Exercise)  12    Symptoms  SOB    Duration  Continue with 30 min of aerobic exercise without signs/symptoms of physical distress.    Intensity  THRR unchanged      Progression   Progression  Continue to progress workloads to maintain intensity without signs/symptoms of physical distress.    Average METs  3.29      Resistance Training   Training Prescription  Yes    Weight  4    Reps  10-15      Treadmill   MPH  3.2    Grade  0.5    Minutes  17    METs  3.66      Recumbant Elliptical   Level  3    RPM  68    Watts  93    Minutes  22    METs  5.2      Home Exercise Plan   Plans to continue exercise at  Home (comment)    Frequency  Add 2 additional days to  program exercise sessions.    Initial Home Exercises Provided  07/15/19       Nutrition:  Target Goals: Understanding of nutrition guidelines, daily intake of sodium '1500mg'$ , cholesterol '200mg'$ , calories 30% from fat and 7% or less from saturated fats, daily to have 5 or more servings of fruits and vegetables.  Biometrics: Pre Biometrics - 06/23/19 1340      Pre Biometrics   Height  '5\' 8"'$  (1.727 m)    Waist Circumference  37.5 inches    Hip Circumference  39 inches    Waist to Hip Ratio  0.96 %    Triceps Skinfold  6 mm    % Body Fat  22.1 %    Grip Strength  2.3 kg   kg   Flexibility  0 in   lower back pain due to cartilage issues and fusion.   Single Leg Stand  60 seconds        Nutrition Therapy Plan and Nutrition Goals: Nutrition Therapy & Goals - 09/04/19 1250      Personal Nutrition Goals   Comments  We have resumed our RD classes and are currently working on scheduling patients. Patient continues to eat more grilled food, less fried and greasy foods. Will continue to monitor for progress.      Intervention Plan   Intervention  Nutrition handout(s) given to patient.    Expected Outcomes  Short Term Goal: Understand basic principles of dietary content, such as calories, fat, sodium, cholesterol and nutrients.       Nutrition Assessments: Nutrition Assessments - 06/23/19 1454      MEDFICTS Scores   Pre Score  42       Nutrition Goals Re-Evaluation:   Nutrition Goals Discharge (Final Nutrition Goals Re-Evaluation):   Psychosocial: Target Goals: Acknowledge presence or absence of significant depression and/or stress, maximize coping skills, provide positive support system. Participant is able to verbalize types and ability to use techniques and skills needed for reducing stress and depression.  Initial Review & Psychosocial Screening: Initial Psych Review & Screening - 06/23/19 1452      Initial Review   Current issues with  History of Depression;Current  Anxiety/Panic;Current Stress Concerns    Source of Stress Concerns  None Identified    Comments  PTSD      Family Dynamics   Good Support System?  Yes      Barriers   Psychosocial barriers to participate  in program  The patient should benefit from training in stress management and relaxation.      Screening Interventions   Interventions  Encouraged to exercise    Expected Outcomes  Short Term goal: Identification and review with participant of any Quality of Life or Depression concerns found by scoring the questionnaire.;Long Term goal: The participant improves quality of Life and PHQ9 Scores as seen by post scores and/or verbalization of changes       Quality of Life Scores: Quality of Life - 06/23/19 1341      Quality of Life   Select  Quality of Life      Quality of Life Scores   Health/Function Pre  10.1 %    Socioeconomic Pre  19.38 %    Psych/Spiritual Pre  18.79 %    Family Pre  12 %    GLOBAL Pre  14.23 %      Scores of 19 and below usually indicate a poorer quality of life in these areas.  A difference of  2-3 points is a clinically meaningful difference.  A difference of 2-3 points in the total score of the Quality of Life Index has been associated with significant improvement in overall quality of life, self-image, physical symptoms, and general health in studies assessing change in quality of life.  PHQ-9: Recent Review Flowsheet Data    Depression screen [facility] 2/9 06/23/2019 06/06/2016 12/16/2015 11/29/2015   Decreased Interest 2 0 0 0   Down, Depressed, Hopeless 1 0 0 0   PHQ - 2 Score 3 0 0 0   Altered sleeping 2 - - -   Tired, decreased energy 3 - - -   Change in appetite 1 - - -   Feeling bad or failure about yourself  2 - - -   Trouble concentrating 2 - - -   Moving slowly or fidgety/restless 3 - - -   Suicidal thoughts 2 - - -   PHQ-9 Score 18 - - -   Difficult doing work/chores Extremely dIfficult - - -     Interpretation of Total Score  Total Score  Depression Severity:  1-4 = Minimal depression, 5-9 = Mild depression, 10-14 = Moderate depression, 15-19 = Moderately severe depression, 20-27 = Severe depression   Psychosocial Evaluation and Intervention: Psychosocial Evaluation - 06/23/19 1453      Psychosocial Evaluation & Interventions   Interventions  Encouraged to exercise with the program and follow exercise prescription    Continue Psychosocial Services   Follow up required by staff       Psychosocial Re-Evaluation: Psychosocial Re-Evaluation    Row Name 07/15/19 1013 08/06/19 1623 09/04/19 1255         Psychosocial Re-Evaluation   Current issues with  History of Depression;Current Anxiety/Panic  -  History of Depression;Current Anxiety/Panic     Comments  Patinet's intiial QOL score was 14.23 and his PHQ-9 score was 18. He does suffer from PTSD from his service in the [employer]. His depression is managed with Citalopram 10 mg daily and he is in counseling. He feels his psychosocial issues are managed. Will continue to monitor for progress.  Patinet's intiial QOL score was 14.23 and his PHQ-9 score was 18. He does suffer from PTSD from his service in the [employer]. His depression is managed with Citalopram 10 mg daily and he is in counseling. He feels his psychosocial issues are managed. Will continue to monitor for progress.  Patinet's intiial QOL score was 14.23 and  his PHQ-9 score was 18. He does suffer from PTSD from his service in the [employer]. His depression is managed with Citalopram 10 mg daily and he is in counseling. He feels his psychosocial issues are managed. He does have difficult days with his PTSD but feels like he is able to manage his symptoms. Will continue to monitor for progress.     Expected Outcomes  Patient will have improved QOL and PHQ-9 scores at discharage and his psychosocial issues will continue to be managed with no additional issues identified.  Patient will have improved QOL and PHQ-9 scores at discharage  and his psychosocial issues will continue to be managed with no additional issues identified.  Patient will have improved QOL and PHQ-9 scores at discharage and his psychosocial issues will continue to be managed with no additional issues identified.     Interventions  Stress management education;Encouraged to attend Cardiac Rehabilitation for the exercise;Relaxation education  Stress management education;Encouraged to attend Cardiac Rehabilitation for the exercise;Relaxation education  Stress management education;Encouraged to attend Cardiac Rehabilitation for the exercise;Relaxation education     Continue Psychosocial Services   Follow up required by staff  Follow up required by staff  No Follow up required        Psychosocial Discharge (Final Psychosocial Re-Evaluation): Psychosocial Re-Evaluation - 09/04/19 1255      Psychosocial Re-Evaluation   Current issues with  History of Depression;Current Anxiety/Panic    Comments  Patinet's intiial QOL score was 14.23 and his PHQ-9 score was 18. He does suffer from PTSD from his service in the [employer]. His depression is managed with Citalopram 10 mg daily and he is in counseling. He feels his psychosocial issues are managed. He does have difficult days with his PTSD but feels like he is able to manage his symptoms. Will continue to monitor for progress.    Expected Outcomes  Patient will have improved QOL and PHQ-9 scores at discharage and his psychosocial issues will continue to be managed with no additional issues identified.    Interventions  Stress management education;Encouraged to attend Cardiac Rehabilitation for the exercise;Relaxation education    Continue Psychosocial Services   No Follow up required       Vocational Rehabilitation: Provide vocational rehab assistance to qualifying candidates.   Vocational Rehab Evaluation & Intervention: Vocational Rehab - 06/23/19 1455      Initial Vocational Rehab Evaluation & Intervention    Assessment shows need for Vocational Rehabilitation  No       Education: Education Goals: Education classes will be provided on a weekly basis, covering required topics. Participant will state understanding/return demonstration of topics presented.  Learning Barriers/Preferences: Learning Barriers/Preferences - 06/23/19 1455      Learning Barriers/Preferences   Learning Barriers  None    Learning Preferences  Video;Written Material;Verbal Instruction;Individual Instruction;Group Instruction       Education Topics: Hypertension, Hypertension Reduction -Define heart disease and high blood pressure. Discus how high blood pressure affects the body and ways to reduce high blood pressure.   CARDIAC REHAB PHASE II EXERCISE from 09/03/2019 in [place]  Date  07/30/19  Educator  DC  Instruction Review Code  2- Demonstrated Understanding      Exercise and Your Heart -Discuss why it is important to exercise, the FITT principles of exercise, normal and abnormal responses to exercise, and how to exercise safely.   Angina -Discuss definition of angina, causes of angina, treatment of angina, and how to decrease risk of having angina.  CARDIAC REHAB PHASE II EXERCISE from 09/03/2019 in [place]  Date  08/13/19  Educator  DC  Instruction Review Code  2- Demonstrated Understanding      Cardiac Medications -Review what the following cardiac medications are used for, how they affect the body, and side effects that may occur when taking the medications.  Medications include Aspirin, Beta blockers, calcium channel blockers, ACE Inhibitors, angiotensin receptor blockers, diuretics, digoxin, and antihyperlipidemics.   Congestive Heart Failure -Discuss the definition of CHF, how to live with CHF, the signs and symptoms of CHF, and how keep track of weight and sodium intake.   Heart Disease and Intimacy -Discus the effect sexual activity has on  the heart, how changes occur during intimacy as we age, and safety during sexual activity.   CARDIAC REHAB PHASE II EXERCISE from 09/03/2019 in [place]  Date  09/03/19  Educator  D. Coad  Instruction Review Code  2- Demonstrated Understanding      Smoking Cessation / COPD -Discuss different methods to quit smoking, the health benefits of quitting smoking, and the definition of COPD.   Nutrition I: Fats -Discuss the types of cholesterol, what cholesterol does to the heart, and how cholesterol levels can be controlled.   Nutrition II: Labels -Discuss the different components of food labels and how to read food label   CARDIAC REHAB PHASE II EXERCISE from 09/03/2019 in [place]  Date  06/25/19  Educator  DC  Instruction Review Code  1- Verbalizes Understanding      Heart Parts/Heart Disease and PAD -Discuss the anatomy of the heart, the pathway of blood circulation through the heart, and these are affected by heart disease.   CARDIAC REHAB PHASE II EXERCISE from 09/03/2019 in [place]  Date  07/02/19  Educator  DJ  Instruction Review Code  2- Demonstrated Understanding      Stress I: Signs and Symptoms -Discuss the causes of stress, how stress may lead to anxiety and depression, and ways to limit stress.   Stress II: Relaxation -Discuss different types of relaxation techniques to limit stress.   CARDIAC REHAB PHASE II EXERCISE from 09/03/2019 in [place]  Date  07/16/19  Educator  Etheleen Mayhew  Instruction Review Code  2- Demonstrated Understanding      Warning Signs of Stroke / TIA -Discuss definition of a stroke, what the signs and symptoms are of a stroke, and how to identify when someone is having stroke.   Knowledge Questionnaire Score: Knowledge Questionnaire Score - 06/23/19 1455      Knowledge Questionnaire Score   Pre Score  24/28       Core  Components/Risk Factors/Patient Goals at Admission: Personal Goals and Risk Factors at Admission - 06/23/19 1455      Core Components/Risk Factors/Patient Goals on Admission    Weight Management  Weight Maintenance    Personal Goal Other  Yes    Personal Goal  GEt rid of SOB, Be able to do things easier, do more things with grandchildren.    Intervention  Attend CR 3 x times week and supplement with at home exercise 2 x week.    Expected Outcomes  Reach expected goals.       Core Components/Risk Factors/Patient Goals Review:  Goals and Risk Factor Review    Row Name 07/15/19 1011 08/06/19 1618 09/04/19 1251         Core Components/Risk Factors/Patient Goals Review  Personal Goals Review  Weight Management/Obesity;Other;Improve shortness of breath with ADL's No SOB; get things done easier; do things with grandchildren.  Weight Management/Obesity;Other;Improve shortness of breath with ADL's No SOB; Get thing done easier; Do things with grandchildren.  Weight Management/Obesity;Other;Improve shortness of breath with ADL's No SOB; get things done easier; do things with grandchildren.     Review  Patient has completed 7 sessions maintaining his weight since last 30 day review. He is doing well in the program with progression. He says he is breathing better and is now not afraid to walk and exercise some on his own. He does continue to smoke 1 pack/day cigerattes. He says he is not trying to quit at this time. He is working toward meeting his personal goals. Will continue to monitor for progress.  Patient has completed 14 sessions losing 1 lb in past 30 days. He continues to do well in the program with continued progression. He continues to smoke cigerattes 1 p/day and expresses on interest in quitting. He reports feeling less angina and does feel stronger. He says he thinks his SOB is worsening and has talked to his cardiologist about this. He says he is doing more around the house and hopes to go  hunting with his grandson soon. She ordered a chest x-ray which he completed but has not heard about the results. Will continue to monitor for progress.  Patient has completed 18 sessions gaining 4 lbs since last 30 day review. He continues to do well in the program with progression. His attendance continues to be sparatic. He has recently missed 2 weeks due to being possibly exposed to [place]. He tested negative. He continues to report SOB. He was going to have an echocardiagram. He wasn't sure of the results. His MD also ordered a chest x-ray which was unremarkable. He continues to be followed by cardiologist at [facility]. He says he is doing things around the house and has been hunting a few times. Will continue to monitor for progress.     Expected Outcomes  Patient will continue to attend sessions meeting his personal goals.  Patient will continue to attend sessions meeting his personal goals.  Patient will continue to attend sessions meeting his personal goals.        Core Components/Risk Factors/Patient Goals at Discharge (Final Review):  Goals and Risk Factor Review - 09/04/19 1251      Core Components/Risk Factors/Patient Goals Review   Personal Goals Review  Weight Management/Obesity;Other;Improve shortness of breath with ADL's   No SOB; get things done easier; do things with grandchildren.   Review  Patient has completed 18 sessions gaining 4 lbs since last 30 day review. He continues to do well in the program with progression. His attendance continues to be sparatic. He has recently missed 2 weeks due to being possibly exposed to [place]. He tested negative. He continues to report SOB. He was going to have an echocardiagram. He wasn't sure of the results. His MD also ordered a chest x-ray which was unremarkable. He continues to be followed by cardiologist at [facility]. He says he is doing things around the house and has been hunting a few times. Will continue to monitor for progress.    Expected Outcomes   Patient will continue to attend sessions meeting his personal goals.       ITP Comments: ITP Comments    Row Name 06/23/19 1443           ITP Comments  Patient has PTSD.  His triggers are loud noises, people behind him, not having his back to a wall, un eased when people enter the room with out introduction. He is otherwise mild temperment.          Comments: ITP REVIEW Pt is making expected progress toward Cardiac Rehab goals after completing 18 sessions. Recommend continued exercise, life style modification, education, and increased stamina and strength.

## 2019-09-05 ENCOUNTER — Other Ambulatory Visit: Payer: Self-pay

## 2019-09-05 ENCOUNTER — Encounter (HOSPITAL_COMMUNITY)
Admission: RE | Admit: 2019-09-05 | Discharge: 2019-09-05 | Disposition: A | Discharge status: Home / Self Care | Payer: No Typology Code available for payment source | Source: Ambulatory Visit | Attending: Internal Medicine | Admitting: Internal Medicine

## 2019-09-05 DIAGNOSIS — I301 Infective pericarditis: Secondary | ICD-10-CM

## 2019-09-05 NOTE — Progress Notes (Signed)
Daily Session Note  Patient Details  Name: BROC CASPERS MRN: [id number] Date of Birth: 1981-03-07 Referring Provider:     [facility] from 06/23/2019 in [place]  Referring Provider  [place]      Encounter Date: 09/05/2019  Check In: Session Check In - 09/05/19 1545      Check-In   Supervising physician immediately available to respond to emergencies  See telemetry face sheet for immediately available MD    Location  [facility]    Staff Present  Russella Dar, MS, EP, [facility], Exercise Physiologist;Ophie Burrowes Wynetta Emery, RN, BSN    Virtual Visit  No    Medication changes reported      No    Fall or balance concerns reported     No    Tobacco Cessation  No Change    Warm-up and Cool-down  Performed as group-led instruction    Resistance Training Performed  Yes    VAD Patient?  No    PAD/SET Patient?  No      Pain Assessment   Currently in Pain?  No/denies    Pain Score  0-No pain    Multiple Pain Sites  No       Capillary Blood Glucose: No results found for this or any previous visit (from the past 24 hour(s)).    Social History   Tobacco Use  Smoking Status Current Every Day Smoker  . Packs/day: 1.00  . Years: 14.00  . Pack years: 14.00  . Types: Cigarettes  . Last attempt to quit: 10/23/2005  . Years since quitting: 13.8  Smokeless Tobacco Never Used    Goals Met:  Independence with exercise equipment Exercise tolerated well No report of cardiac concerns or symptoms Strength training completed today  Goals Unmet:  Not Applicable  Comments: Check out 1545.   Dr. Kate Sable is Medical Director for [facility] Cardiac and Pulmonary Rehab.

## 2019-09-08 ENCOUNTER — Encounter (HOSPITAL_COMMUNITY)
Admission: RE | Admit: 2019-09-08 | Discharge: 2019-09-08 | Disposition: A | Discharge status: Home / Self Care | Payer: No Typology Code available for payment source | Source: Ambulatory Visit | Attending: Internal Medicine | Admitting: Internal Medicine

## 2019-09-08 ENCOUNTER — Other Ambulatory Visit: Payer: Self-pay

## 2019-09-08 DIAGNOSIS — I301 Infective pericarditis: Secondary | ICD-10-CM

## 2019-09-08 NOTE — Progress Notes (Signed)
Daily Session Note  Patient Details  Name: Jason Sheppard MRN: [id number] Date of Birth: [birth year] Referring Provider:     [place] from 06/23/2019 in [place]  Referring Provider  [place]      Encounter Date: 09/08/2019  Check In: Session Check In - 09/08/19 1545      Check-In   Supervising physician immediately available to respond to emergencies  See telemetry face sheet for immediately available MD    Location  [facility]    Staff Present  Russella Dar, MS, EP, [facility], Exercise Physiologist;Bennet Kujawa Wynetta Emery, RN, BSN    Virtual Visit  No    Medication changes reported      No    Fall or balance concerns reported     No    Tobacco Cessation  No Change    Warm-up and Cool-down  Performed as group-led instruction    Resistance Training Performed  Yes    VAD Patient?  No    PAD/SET Patient?  No      Pain Assessment   Currently in Pain?  No/denies    Pain Score  0-No pain    Multiple Pain Sites  No       Capillary Blood Glucose: No results found for this or any previous visit (from the past 24 hour(s)).    Social History   Tobacco Use  Smoking Status Current Every Day Smoker  . Packs/day: 1.00  . Years: 14.00  . Pack years: 14.00  . Types: Cigarettes  . Last attempt to quit: 10/23/2005  . Years since quitting: 13.8  Smokeless Tobacco Never Used    Goals Met:  Independence with exercise equipment Exercise tolerated well No report of cardiac concerns or symptoms Strength training completed today  Goals Unmet:  Not Applicable  Comments: Check out 1545.   Dr. Kate Sable is Medical Director for [facility] Cardiac and Pulmonary Rehab.

## 2019-09-10 ENCOUNTER — Other Ambulatory Visit: Payer: Self-pay

## 2019-09-10 ENCOUNTER — Encounter (HOSPITAL_COMMUNITY)
Admission: RE | Admit: 2019-09-10 | Discharge: 2019-09-10 | Disposition: A | Discharge status: Home / Self Care | Payer: No Typology Code available for payment source | Source: Ambulatory Visit | Attending: Internal Medicine | Admitting: Internal Medicine

## 2019-09-10 DIAGNOSIS — I301 Infective pericarditis: Secondary | ICD-10-CM

## 2019-09-10 NOTE — Progress Notes (Signed)
Daily Session Note  Patient Details  Name: Jason Sheppard MRN: [id number] Date of Birth: 04/30/1981 Referring Provider:     CARDIAC REHAB PHASE II ORIENTATION from 06/23/2019 in [place] [facility]  Referring Provider  [place]      Encounter Date: 09/10/2019  Check In: Session Check In - 09/10/19 1545      Check-In   Supervising physician immediately available to respond to emergencies  See telemetry face sheet for immediately available MD    Location  [facility]    Staff Present  Diane Coad, MS, EP, [facility], Exercise Physiologist; , RN, BSN    Virtual Visit  No    Medication changes reported      No    Fall or balance concerns reported     No    Tobacco Cessation  No Change    Warm-up and Cool-down  Performed as group-led instruction    Resistance Training Performed  Yes    VAD Patient?  No    PAD/SET Patient?  No      Pain Assessment   Currently in Pain?  No/denies    Pain Score  0-No pain    Multiple Pain Sites  No       Capillary Blood Glucose: No results found for this or any previous visit (from the past 24 hour(s)).    Social History   Tobacco Use  Smoking Status Current Every Day Smoker  . Packs/day: 1.00  . Years: 14.00  . Pack years: 14.00  . Types: Cigarettes  . Last attempt to quit: 10/23/2005  . Years since quitting: 13.8  Smokeless Tobacco Never Used    Goals Met:  Independence with exercise equipment Exercise tolerated well No report of cardiac concerns or symptoms Strength training completed today  Goals Unmet:  Not Applicable  Comments: Check out 1545.   Dr. Suresh Koneswaran is Medical Director for  Cardiac and Pulmonary Rehab. 

## 2019-09-12 ENCOUNTER — Encounter (HOSPITAL_COMMUNITY): Payer: No Typology Code available for payment source

## 2019-09-15 ENCOUNTER — Other Ambulatory Visit: Payer: Self-pay

## 2019-09-15 ENCOUNTER — Encounter (HOSPITAL_COMMUNITY)
Admission: RE | Admit: 2019-09-15 | Discharge: 2019-09-15 | Disposition: A | Discharge status: Home / Self Care | Payer: No Typology Code available for payment source | Source: Ambulatory Visit | Attending: Internal Medicine | Admitting: Internal Medicine

## 2019-09-15 DIAGNOSIS — I301 Infective pericarditis: Secondary | ICD-10-CM | POA: Diagnosis not present

## 2019-09-15 NOTE — Progress Notes (Signed)
Daily Session Note  Patient Details  Name: Jason Sheppard MRN: [id number] Date of Birth: 1981-09-23 Referring Provider:     [facility] from 06/23/2019 in [place]  Referring Provider  [place]      Encounter Date: 09/15/2019  Check In: Session Check In - 09/15/19 1545      Check-In   Supervising physician immediately available to respond to emergencies  See telemetry face sheet for immediately available MD    Location  [facility]    Staff Present  Russella Dar, MS, EP, [facility], Exercise Physiologist;Mardy Lucier Wynetta Emery, RN, BSN    Virtual Visit  No    Medication changes reported      No    Fall or balance concerns reported     No    Tobacco Cessation  No Change    Warm-up and Cool-down  Performed as group-led instruction    Resistance Training Performed  Yes    VAD Patient?  No    PAD/SET Patient?  No      Pain Assessment   Currently in Pain?  No/denies    Pain Score  0-No pain    Multiple Pain Sites  No       Capillary Blood Glucose: No results found for this or any previous visit (from the past 24 hour(s)).    Social History   Tobacco Use  Smoking Status Current Every Day Smoker  . Packs/day: 1.00  . Years: 14.00  . Pack years: 14.00  . Types: Cigarettes  . Last attempt to quit: 10/23/2005  . Years since quitting: 13.9  Smokeless Tobacco Never Used    Goals Met:  Independence with exercise equipment Exercise tolerated well No report of cardiac concerns or symptoms Strength training completed today  Goals Unmet:  Not Applicable  Comments: Check out 1545.   Dr. Kate Sable is Medical Director for [facility] Cardiac and Pulmonary Rehab.

## 2019-09-17 ENCOUNTER — Encounter (HOSPITAL_COMMUNITY)
Admission: RE | Admit: 2019-09-17 | Discharge: 2019-09-17 | Disposition: A | Discharge status: Home / Self Care | Payer: No Typology Code available for payment source | Source: Ambulatory Visit | Attending: Internal Medicine | Admitting: Internal Medicine

## 2019-09-17 ENCOUNTER — Other Ambulatory Visit: Payer: Self-pay

## 2019-09-17 DIAGNOSIS — I301 Infective pericarditis: Secondary | ICD-10-CM | POA: Diagnosis not present

## 2019-09-17 NOTE — Progress Notes (Signed)
Daily Session Note  Patient Details  Name: Jason Sheppard MRN: [id number] Date of Birth: 08/12/1981 Referring Provider:     CARDIAC REHAB PHASE II ORIENTATION from confidential encounter on 06/23/2019  Referring Provider  [place]      Encounter Date: 09/17/2019  Check In: Session Check In - 09/17/19 1553      Check-In   Supervising physician immediately available to respond to emergencies  See telemetry face sheet for immediately available MD    Location  [facility]    Staff Present   , MS, EP, [facility], Exercise Physiologist;Debra Johnson, RN, BSN    Virtual Visit  No    Medication changes reported      No    Fall or balance concerns reported     No    Tobacco Cessation  No Change    Warm-up and Cool-down  Performed as group-led instruction    Resistance Training Performed  Yes    VAD Patient?  No    PAD/SET Patient?  No      Pain Assessment   Currently in Pain?  No/denies    Pain Score  0-No pain       Capillary Blood Glucose: No results found for this or any previous visit (from the past 24 hour(s)).    Social History   Tobacco Use  Smoking Status Current Every Day Smoker  . Packs/day: 1.00  . Years: 14.00  . Pack years: 14.00  . Types: Cigarettes  . Last attempt to quit: 10/23/2005  . Years since quitting: 13.9  Smokeless Tobacco Never Used    Goals Met:  Independence with exercise equipment Exercise tolerated well Personal goals reviewed No report of cardiac concerns or symptoms Strength training completed today  Goals Unmet:  Not Applicable  Comments: Check out: 1645   Dr. Suresh Koneswaran is Medical Director for  Cardiac and Pulmonary Rehab. 

## 2019-09-22 ENCOUNTER — Encounter (HOSPITAL_COMMUNITY)
Admission: RE | Admit: 2019-09-22 | Discharge: 2019-09-22 | Disposition: A | Discharge status: Home / Self Care | Payer: No Typology Code available for payment source | Source: Ambulatory Visit | Attending: Internal Medicine | Admitting: Internal Medicine

## 2019-09-22 ENCOUNTER — Other Ambulatory Visit: Payer: Self-pay

## 2019-09-22 DIAGNOSIS — I301 Infective pericarditis: Secondary | ICD-10-CM | POA: Diagnosis not present

## 2019-09-22 NOTE — Progress Notes (Signed)
Daily Session Note  Patient Details  Name: Jason Sheppard MRN: [id number] Date of Birth: 04-Dec-1980 Referring Provider:     [facility] from 06/23/2019 in [place]  Referring Provider  [place]      Encounter Date: 09/22/2019  Check In: Session Check In - 09/22/19 1545      Check-In   Supervising physician immediately available to respond to emergencies  See telemetry face sheet for immediately available MD    Location  [facility]    Staff Present  Russella Dar, MS, EP, [facility], Exercise Physiologist;Edward Guthmiller Wynetta Emery, RN, BSN    Virtual Visit  No    Medication changes reported      No    Fall or balance concerns reported     No    Tobacco Cessation  No Change    Warm-up and Cool-down  Performed as group-led instruction    Resistance Training Performed  Yes    VAD Patient?  No    PAD/SET Patient?  No      Pain Assessment   Currently in Pain?  No/denies    Pain Score  0-No pain    Multiple Pain Sites  No       Capillary Blood Glucose: No results found for this or any previous visit (from the past 24 hour(s)).    Social History   Tobacco Use  Smoking Status Current Every Day Smoker  . Packs/day: 1.00  . Years: 14.00  . Pack years: 14.00  . Types: Cigarettes  . Last attempt to quit: 10/23/2005  . Years since quitting: 13.9  Smokeless Tobacco Never Used    Goals Met:  Independence with exercise equipment Exercise tolerated well No report of cardiac concerns or symptoms Strength training completed today  Goals Unmet:  Not Applicable  Comments: Pt able to follow exercise prescription today without complaint.  Will continue to monitor for progression. Check out 1645.   Dr. Kate Sable is Medical Director for [facility] Cardiac and Pulmonary Rehab.

## 2019-09-24 ENCOUNTER — Encounter (HOSPITAL_COMMUNITY): Payer: No Typology Code available for payment source

## 2019-09-26 ENCOUNTER — Other Ambulatory Visit: Payer: Self-pay

## 2019-09-26 ENCOUNTER — Encounter (HOSPITAL_COMMUNITY)
Admission: RE | Admit: 2019-09-26 | Discharge: 2019-09-26 | Disposition: A | Discharge status: Home / Self Care | Payer: No Typology Code available for payment source | Source: Ambulatory Visit | Attending: Internal Medicine | Admitting: Internal Medicine

## 2019-09-26 DIAGNOSIS — I301 Infective pericarditis: Secondary | ICD-10-CM

## 2019-09-26 NOTE — Progress Notes (Signed)
Daily Session Note  Patient Details  Name: WHITAKER HOLDERMAN MRN: [id number] Date of Birth: [birth year] Referring Provider:     CARDIAC REHAB PHASE II ORIENTATION from confidential encounter on 06/23/2019  Referring Provider  [place]      Encounter Date: 09/26/2019  Check In: Session Check In - 09/26/19 0829      Check-In   Supervising physician immediately available to respond to emergencies  See telemetry face sheet for immediately available MD    Location  [facility]    Staff Present  Russella Dar, MS, EP, [facility], Exercise Physiologist;Debra Wynetta Emery, RN, BSN    Virtual Visit  No    Medication changes reported      No    Fall or balance concerns reported     No    Tobacco Cessation  No Change    Warm-up and Cool-down  Performed as group-led instruction    Resistance Training Performed  Yes    VAD Patient?  No    PAD/SET Patient?  No      Pain Assessment   Currently in Pain?  No/denies    Pain Score  0-No pain    Multiple Pain Sites  No       Capillary Blood Glucose: No results found for this or any previous visit (from the past 24 hour(s)).    Social History   Tobacco Use  Smoking Status Current Every Day Smoker  . Packs/day: 1.00  . Years: 14.00  . Pack years: 14.00  . Types: Cigarettes  . Last attempt to quit: 10/23/2005  . Years since quitting: 13.9  Smokeless Tobacco Never Used    Goals Met:  Independence with exercise equipment Exercise tolerated well Personal goals reviewed No report of cardiac concerns or symptoms Strength training completed today  Goals Unmet:  Not Applicable  Comments: Check out: 0915   Dr. Kate Sable is Medical Director for [facility] and Pulmonary Rehab.

## 2019-09-29 ENCOUNTER — Emergency Department (HOSPITAL_COMMUNITY)
Admission: EM | Admit: 2019-09-29 | Discharge: 2019-09-30 | Disposition: A | Discharge status: Home / Self Care | Payer: No Typology Code available for payment source | Attending: Emergency Medicine | Admitting: Emergency Medicine

## 2019-09-29 ENCOUNTER — Encounter (HOSPITAL_COMMUNITY): Payer: No Typology Code available for payment source

## 2019-09-29 ENCOUNTER — Encounter (HOSPITAL_COMMUNITY): Payer: Self-pay

## 2019-09-29 ENCOUNTER — Other Ambulatory Visit: Payer: Self-pay

## 2019-09-29 ENCOUNTER — Emergency Department (HOSPITAL_COMMUNITY): Payer: No Typology Code available for payment source

## 2019-09-29 DIAGNOSIS — Z91040 Latex allergy status: Secondary | ICD-10-CM | POA: Insufficient documentation

## 2019-09-29 DIAGNOSIS — F17210 Nicotine dependence, cigarettes, uncomplicated: Secondary | ICD-10-CM | POA: Insufficient documentation

## 2019-09-29 DIAGNOSIS — R0602 Shortness of breath: Secondary | ICD-10-CM | POA: Diagnosis not present

## 2019-09-29 DIAGNOSIS — Z79899 Other long term (current) drug therapy: Secondary | ICD-10-CM | POA: Insufficient documentation

## 2019-09-29 DIAGNOSIS — Z21 Asymptomatic human immunodeficiency virus [HIV] infection status: Secondary | ICD-10-CM | POA: Insufficient documentation

## 2019-09-29 DIAGNOSIS — Z20828 Contact with and (suspected) exposure to other viral communicable diseases: Secondary | ICD-10-CM | POA: Insufficient documentation

## 2019-09-29 DIAGNOSIS — R0789 Other chest pain: Secondary | ICD-10-CM | POA: Insufficient documentation

## 2019-09-29 LAB — BASIC METABOLIC PANEL
Anion gap: 10 (ref 5–15)
BUN: 19 mg/dL (ref 6–20)
CO2: 27 mmol/L (ref 22–32)
Calcium: 8.6 mg/dL — ABNORMAL LOW (ref 8.9–10.3)
Chloride: 96 mmol/L — ABNORMAL LOW (ref 98–111)
Creatinine, Ser: 1.2 mg/dL (ref 0.61–1.24)
GFR calc Af Amer: >60 mL/min (ref >60)
GFR calc non Af Amer: >60 mL/min (ref >60)
Glucose, Bld: 153 mg/dL — ABNORMAL HIGH (ref 70–99)
Potassium: 3.8 mmol/L (ref 3.5–5.1)
Sodium: 133 mmol/L — ABNORMAL LOW (ref 135–145)

## 2019-09-29 LAB — DIFFERENTIAL
Abs Immature Granulocytes: 0.12 10*3/uL — ABNORMAL HIGH (ref 0.00–0.07)
Basophils Absolute: 0 10*3/uL (ref 0.0–0.1)
Basophils Relative: 0 %
Eosinophils Absolute: 0.4 10*3/uL (ref 0.0–0.5)
Eosinophils Relative: 4 %
Immature Granulocytes: 1 %
Lymphocytes Relative: 21 %
Lymphs Abs: 2.3 10*3/uL (ref 0.7–4.0)
Monocytes Absolute: 1 10*3/uL (ref 0.1–1.0)
Monocytes Relative: 9 %
Neutro Abs: 7.4 10*3/uL (ref 1.7–7.7)
Neutrophils Relative %: 65 %

## 2019-09-29 LAB — HEPATIC FUNCTION PANEL
ALT: 61 U/L — ABNORMAL HIGH (ref 0–44)
AST: 28 U/L (ref 15–41)
Albumin: 4.5 g/dL (ref 3.5–5.0)
Alkaline Phosphatase: 66 U/L (ref 38–126)
Bilirubin, Direct: 0.1 mg/dL (ref 0.0–0.2)
Indirect Bilirubin: 0.5 mg/dL (ref 0.3–0.9)
Total Bilirubin: 0.6 mg/dL (ref 0.3–1.2)
Total Protein: 7.8 g/dL (ref 6.5–8.1)

## 2019-09-29 LAB — CBC
HCT: 42.6 % (ref 39.0–52.0)
Hemoglobin: 15.1 g/dL (ref 13.0–17.0)
MCH: 31.3 pg (ref 26.0–34.0)
MCHC: 35.4 g/dL (ref 30.0–36.0)
MCV: 88.2 fL (ref 80.0–100.0)
Platelets: 278 10*3/uL (ref 150–400)
RBC: 4.83 MIL/uL (ref 4.22–5.81)
RDW: 12.7 % (ref 11.5–15.5)
WBC: 11.3 10*3/uL — ABNORMAL HIGH (ref 4.0–10.5)
nRBC: 0 % (ref 0.0–0.2)

## 2019-09-29 LAB — POC SARS CORONAVIRUS 2 AG -  ED: SARS Coronavirus 2 Ag: NEGATIVE

## 2019-09-29 LAB — TROPONIN I (HIGH SENSITIVITY)
Troponin I (High Sensitivity): 2 ng/L (ref <18)
Troponin I (High Sensitivity): 3 ng/L (ref <18)

## 2019-09-29 LAB — BRAIN NATRIURETIC PEPTIDE: B Natriuretic Peptide: 16 pg/mL (ref 0.0–100.0)

## 2019-09-29 MED ORDER — IOHEXOL 350 MG/ML SOLN
100.0000 mL | Freq: Once | INTRAVENOUS | Status: AC | PRN
  Administered 2019-09-29: 100 mL via INTRAVENOUS

## 2019-09-29 MED ORDER — SODIUM CHLORIDE 0.9% FLUSH
3.0000 mL | Freq: Once | INTRAVENOUS | Status: AC
  Administered 2019-09-29: 21:00:00 3 mL via INTRAVENOUS

## 2019-09-29 NOTE — ED Provider Notes (Addendum)
[facility] EMERGENCY DEPARTMENT Provider Note   CSN: [id number] Arrival date & time: 09/29/19  1854     History   Chief Complaint Chief Complaint  Patient presents with   Chest Pain    HPI Jason Sheppard is a 38 y.o. male.     Patient complains of shortness of breath and chest pain  The history is provided by the patient. No language interpreter was used.  Chest Pain Pain location:  Substernal area and L chest Pain quality: aching   Pain radiates to:  Does not radiate Pain severity:  Mild Onset quality:  Sudden Timing:  Constant Progression:  Waxing and waning Chronicity:  New Context: breathing   Relieved by:  Nothing Associated symptoms: no abdominal pain, no back pain, no cough, no fatigue and no headache     Past Medical History:  Diagnosis Date   Allergy    Attention deficit disorder with hyperactivity    Depression    HIV (human immunodeficiency virus infection) (HCC)    Infective pericarditis    Migraine    OSA (obstructive sleep apnea)    Plantar fascial fibromatosis    PTSD (post-traumatic stress disorder)    Seizure disorder [facility])     Patient Active Problem List   Diagnosis Date Noted   Pericarditis 03/24/2019   CAP (community acquired pneumonia) 02/25/2019   Unintentional weight loss 06/06/2016   HIV disease ([place]) 12/15/2015   Dyslipidemia 12/15/2015   Hyperglycemia 12/15/2015   Sleep apnea 12/15/2015   Former smoker 12/15/2015   Attention deficit disorder with hyperactivity 12/15/2015   PTSD (post-traumatic stress disorder) 12/15/2015    Past Surgical History:  Procedure Laterality Date   HAND SURGERY     shrapnel removal          Home Medications    Prior to Admission medications   Medication Sig Start Date End Date Taking? Authorizing Provider  bictegravir-emtricitabine-tenofovir AF (BIKTARVY) 50-200-25 MG TABS tablet Take 1 tablet by mouth every morning.    Yes [provider]  citalopram  (CELEXA) 10 MG tablet Take 20 mg by mouth daily.    Yes [provider]  hydrOXYzine (VISTARIL) 25 MG capsule Take 25 mg by mouth every 6 (six) hours as needed (extreme anxiety).   Yes [provider]  lisinopril (ZESTRIL) 5 MG tablet Take 10 mg by mouth daily.    Yes [provider]  montelukast (SINGULAIR) 10 MG tablet Take 1 tablet (10 mg total) by mouth every morning. 02/27/19  Yes Aline August, MD  nitroGLYCERIN (NITROSTAT) 0.4 MG SL tablet Place 1 tablet (0.4 mg total) under the tongue every 5 (five) minutes as needed for chest pain. 02/27/19  Yes Aline August, MD  OXcarbazepine (TRILEPTAL) 150 MG tablet Take 300 mg by mouth 2 (two) times daily.    Yes [provider]  pantoprazole (PROTONIX) 40 MG tablet Take 1 tablet (40 mg total) by mouth daily. Patient taking differently: Take 40 mg by mouth every evening.  02/27/19  Yes Aline August, MD  prazosin (MINIPRESS) 2 MG capsule Take 2 mg by mouth at bedtime.   Yes [provider]    Family History Family History  Problem Relation Age of Onset   Diabetes Mother    Heart disease Mother    Hypertension Mother    Diabetes Father    Heart disease Father    Hyperlipidemia Father     Social History Social History   Tobacco Use   Smoking status: Current Every  Day Smoker    Packs/day: 1.00    Years: 14.00    Pack years: 14.00    Types: Cigarettes    Last attempt to quit: 10/23/2005    Years since quitting: 13.9   Smokeless tobacco: Never Used  Substance Use Topics   Alcohol use: No    Alcohol/week: 0.0 standard drinks   Drug use: No     Allergies   Sulfa antibiotics, Keppra [levetiracetam], Iodine, and Latex   Review of Systems Review of Systems  Constitutional: Negative for appetite change and fatigue.  HENT: Negative for congestion, ear discharge and sinus pressure.   Eyes: Negative for discharge.  Respiratory: Negative for cough.   Cardiovascular: Positive for chest  pain.  Gastrointestinal: Negative for abdominal pain and diarrhea.  Genitourinary: Negative for frequency and hematuria.  Musculoskeletal: Negative for back pain.  Skin: Negative for rash.  Neurological: Negative for seizures and headaches.  Psychiatric/Behavioral: Negative for hallucinations.     Physical Exam Updated Vital Signs BP 124/85    Pulse 77    Temp 99.3 F (37.4 C) (Oral)    Resp (!) 27    Ht 5\' 9"  (1.753 m)    Wt 85.3 kg    SpO2 96%    BMI 27.76 kg/m   Physical Exam Vitals and nursing note reviewed.  Constitutional:      Appearance: He is well-developed.  HENT:     Head: Normocephalic.     Nose: Nose normal.  Eyes:     General: No scleral icterus.    Conjunctiva/sclera: Conjunctivae normal.  Neck:     Thyroid: No thyromegaly.  Cardiovascular:     Rate and Rhythm: Normal rate and regular rhythm.     Heart sounds: No murmur. No friction rub. No gallop.   Pulmonary:     Breath sounds: No stridor. No wheezing or rales.  Chest:     Chest wall: No tenderness.  Abdominal:     General: There is no distension.     Tenderness: There is no abdominal tenderness. There is no rebound.  Musculoskeletal:        General: Normal range of motion.     Cervical back: Neck supple.  Lymphadenopathy:     Cervical: No cervical adenopathy.  Skin:    Findings: No erythema or rash.  Neurological:     Mental Status: He is oriented to person, place, and time.     Motor: No abnormal muscle tone.     Coordination: Coordination normal.  Psychiatric:        Behavior: Behavior normal.      ED Treatments / Results  Labs (all labs ordered are listed, but only abnormal results are displayed) Labs Reviewed  BASIC METABOLIC PANEL - Abnormal; Notable for the following components:      Result Value   Sodium 133 (*)    Chloride 96 (*)    Glucose, Bld 153 (*)    Calcium 8.6 (*)    All other components within normal limits  CBC - Abnormal; Notable for the following components:    WBC 11.3 (*)    All other components within normal limits  HEPATIC FUNCTION PANEL - Abnormal; Notable for the following components:   ALT 61 (*)    All other components within normal limits  DIFFERENTIAL - Abnormal; Notable for the following components:   Abs Immature Granulocytes 0.12 (*)    All other components within normal limits  NOVEL CORONAVIRUS, NAA (HOSP ORDER, SEND-OUT TO REF LAB;  TAT 18-24 HRS)  BRAIN NATRIURETIC PEPTIDE  POC SARS CORONAVIRUS 2 AG -  ED  TROPONIN I (HIGH SENSITIVITY)  TROPONIN I (HIGH SENSITIVITY)    EKG None  Radiology Dg Chest 2 View  Result Date: 09/29/2019 CLINICAL DATA:  Chest pain, shortness of breath EXAM: CHEST - 2 VIEW COMPARISON:  None. FINDINGS: No consolidation, features of edema, pneumothorax, or effusion. Pulmonary vascularity is normally distributed. The cardiomediastinal contours are unremarkable. No acute osseous or soft tissue abnormality. IMPRESSION: No acute cardiopulmonary abnormality. Electronically Signed   By: Kreg ShropshirePrice  DeHay M.D.   On: 09/29/2019 20:45   Ct Angio Chest Pe W And/or Wo Contrast  Result Date: 09/29/2019 CLINICAL DATA:  Chest pain and shortness of breath EXAM: CT ANGIOGRAPHY CHEST WITH CONTRAST TECHNIQUE: Multidetector CT imaging of the chest was performed using the standard protocol during bolus administration of intravenous contrast. Multiplanar CT image reconstructions and MIPs were obtained to evaluate the vascular anatomy. CONTRAST:  100mL OMNIPAQUE IOHEXOL 350 MG/ML SOLN COMPARISON:  None. FINDINGS: Cardiovascular: --Pulmonary arteries: Contrast injection is sufficient to demonstrate satisfactory opacification of the pulmonary arteries to the segmental level, with attenuation of at least 200 HU at the main pulmonary artery. There is no pulmonary embolus. The main pulmonary artery is within normal limits for size. --Aorta: Satisfactory opacification of the thoracic aorta. No aortic dissection or other acute aortic syndrome.  Conventional 3 vessel aortic branching pattern. There is no aortic atherosclerosis. --Heart: Normal size. No pericardial effusion. Mediastinum/Nodes: No mediastinal, hilar or axillary lymphadenopathy. The visualized thyroid and thoracic esophageal course are unremarkable. Lungs/Pleura: No pulmonary nodules or masses. No pleural effusion or pneumothorax. No focal airspace consolidation. No focal pleural abnormality. Upper Abdomen: Contrast bolus timing is not optimized for evaluation of the abdominal organs. Hepatic steatosis. Left renal atrophy. Musculoskeletal: No chest wall abnormality. No bony spinal canal stenosis. Review of the MIP images confirms the above findings. IMPRESSION: 1. No pulmonary embolus or acute aortic syndrome. 2. No acute thoracic abnormality. 3. Hepatic steatosis. Electronically Signed   By: Deatra RobinsonKevin  Herman M.D.   On: 09/29/2019 22:40    Procedures Procedures (including critical care time)  Medications Ordered in ED Medications  sodium chloride flush (NS) 0.9 % injection 3 mL (3 mLs Intravenous Given 09/29/19 2048)  iohexol (OMNIPAQUE) 350 MG/ML injection 100 mL (100 mLs Intravenous Contrast Given 09/29/19 2159)     Initial Impression / Assessment and Plan / ED Course  I have reviewed the triage vital signs and the nursing notes.  Pertinent labs & imaging results that were available during my care of the patient were reviewed by me and considered in my medical decision making (see chart for details).        Labs unremarkable.  Patient will follow up with primary care doctor  Final Clinical Impressions(s) / ED Diagnoses   Final diagnoses:  None    ED Discharge Orders    None       Bethann BerkshireZammit, Irish Breisch, MD 10/03/19 1014    Bethann BerkshireZammit, Trichelle Lehan, MD 10/15/19 [phone]

## 2019-09-29 NOTE — ED Triage Notes (Signed)
Pt presents to ED with complaints of mid chest pressure started couple days ago. Pt states also has SOB.

## 2019-09-29 NOTE — Discharge Instructions (Signed)
Follow up with your md later this week.  You second covid test can be followed up then

## 2019-10-01 ENCOUNTER — Encounter (HOSPITAL_COMMUNITY): Payer: No Typology Code available for payment source

## 2019-10-01 LAB — NOVEL CORONAVIRUS, NAA (HOSP ORDER, SEND-OUT TO REF LAB; TAT 18-24 HRS): SARS-CoV-2, NAA: NOT DETECTED

## 2019-10-01 NOTE — Progress Notes (Signed)
Cardiac Individual Treatment Plan  Patient Details  Name: Jason Sheppard MRN: [id number] Date of Birth: 12-30-1980 Referring Provider:     [place] from 06/23/2019 in [place]  Referring Provider  [place]      Initial Encounter Date:    CARDIAC REHAB PHASE II ORIENTATION from 06/23/2019 in [place]  Date  06/23/19      Visit Diagnosis: Infectious pericarditis, unspecified chronicity, unspecified infectious etiology  Patient's Home Medications on Admission:  Current Outpatient Medications:  .  bictegravir-emtricitabine-tenofovir AF (BIKTARVY) 50-200-25 MG TABS tablet, Take 1 tablet by mouth every morning. , Disp: , Rfl:  .  citalopram (CELEXA) 10 MG tablet, Take 20 mg by mouth daily. , Disp: , Rfl:  .  hydrOXYzine (VISTARIL) 25 MG capsule, Take 25 mg by mouth every 6 (six) hours as needed (extreme anxiety)., Disp: , Rfl:  .  lisinopril (ZESTRIL) 5 MG tablet, Take 10 mg by mouth daily. , Disp: , Rfl:  .  montelukast (SINGULAIR) 10 MG tablet, Take 1 tablet (10 mg total) by mouth every morning., Disp: , Rfl:  .  nitroGLYCERIN (NITROSTAT) 0.4 MG SL tablet, Place 1 tablet (0.4 mg total) under the tongue every 5 (five) minutes as needed for chest pain., Disp: 30 tablet, Rfl: 0 .  OXcarbazepine (TRILEPTAL) 150 MG tablet, Take 300 mg by mouth 2 (two) times daily. , Disp: , Rfl:  .  pantoprazole (PROTONIX) 40 MG tablet, Take 1 tablet (40 mg total) by mouth daily. (Patient taking differently: Take 40 mg by mouth every evening. ), Disp: 5 tablet, Rfl: 0 .  prazosin (MINIPRESS) 2 MG capsule, Take 2 mg by mouth at bedtime., Disp: , Rfl:   Past Medical History: Past Medical History:  Diagnosis Date  . Allergy   . Attention deficit disorder with hyperactivity   . Depression   . HIV (human immunodeficiency virus infection) ([place])   . Infective pericarditis   . Migraine   . OSA (obstructive sleep apnea)   . Plantar fascial  fibromatosis   . PTSD (post-traumatic stress disorder)   . Seizure disorder ([place])     Tobacco Use: Social History   Tobacco Use  Smoking Status Current Every Day Smoker  . Packs/day: 1.00  . Years: 14.00  . Pack years: 14.00  . Types: Cigarettes  . Last attempt to quit: 10/23/2005  . Years since quitting: 13.9  Smokeless Tobacco Never Used    Labs: Recent Chemical engineer    Labs for ITP Cardiac and Pulmonary Rehab Latest Ref Rng & Units 12/07/2015 05/23/2016 11/28/2016 05/09/2017 02/25/2019   Cholestrol <200 mg/dL 202(H) 187 178 202(H) -   LDLCALC <100 mg/dL 132(H) 87 75 95 -   HDL >40 mg/dL 30(L) 27(L) 27(L) 31(L) -   Trlycerides <150 mg/dL 198(H) 365(H) 382(H) 380(H) -   Hemoglobin A1c 4.8 - 5.6 % - - - - 5.7(H)      Capillary Blood Glucose: No results found for: GLUCAP   Exercise Target Goals: Exercise Program Goal: Individual exercise prescription set using results from initial 6 min walk test and THRR while considering  patient's activity barriers and safety.   Exercise Prescription Goal: Starting with aerobic activity 30 plus minutes a day, 3 days per week for initial exercise prescription. Provide home exercise prescription and guidelines that participant acknowledges understanding prior to discharge.  Activity Barriers & Risk Stratification: Activity Barriers & Cardiac Risk Stratification - 06/23/19 1337  Activity Barriers & Cardiac Risk Stratification   Activity Barriers  Back Problems;Shortness of Breath;Muscular Weakness    Cardiac Risk Stratification  High       6 Minute Walk: 6 Minute Walk    Row Name 06/23/19 1336         6 Minute Walk   Phase  Initial     Distance  1200 feet     Walk Time  6 minutes     # of Rest Breaks  0     MPH  2.27     METS  2.74     RPE  13     Perceived Dyspnea   14     VO2 Peak  15.05     Symptoms  No     Resting HR  71 bpm     Resting BP  130/90     Resting Oxygen Saturation   97 %     Exercise Oxygen  Saturation  during 6 min walk  97 %     Max Ex. HR  85 bpm     Max Ex. BP  152/84     2 Minute Post BP  132/88        Oxygen Initial Assessment:   Oxygen Re-Evaluation:   Oxygen Discharge (Final Oxygen Re-Evaluation):   Initial Exercise Prescription: Initial Exercise Prescription - 06/23/19 1300      Date of Initial Exercise RX and Referring Provider   Date  06/23/19    Referring Provider  [place]    Expected Discharge Date  09/22/19      Treadmill   MPH  2    Grade  0    Minutes  17    METs  2.53      NuStep   Level  1    SPM  80    Minutes  17    METs  1.5      Prescription Details   Frequency (times per week)  3    Duration  Progress to 30 minutes of continuous aerobic without signs/symptoms of physical distress      Intensity   THRR 40-80% of Max Heartrate  [phone]    Ratings of Perceived Exertion  11-15    Perceived Dyspnea  0-4      Progression   Progression  Continue to progress workloads to maintain intensity without signs/symptoms of physical distress.      Resistance Training   Training Prescription  Yes    Weight  1    Reps  10-15       Perform Capillary Blood Glucose checks as needed.  Exercise Prescription Changes:  Exercise Prescription Changes    Row Name 06/23/19 1300 07/15/19 1200 07/31/19 1000 08/14/19 1200 09/04/19 0900     Response to Exercise   Blood Pressure (Admit)  130/90  128/82  110/84  150/82  130/70   Blood Pressure (Exercise)  152/84  144/80  130/88  160/72  128/88   Blood Pressure (Exit)  132/88  118/80  128/90  122/80  120/84   Heart Rate (Admit)  71 bpm  70 bpm  72 bpm  63 bpm  76 bpm   Heart Rate (Exercise)  85 bpm  96 bpm  104 bpm  104 bpm  102 bpm   Heart Rate (Exit)  77 bpm  79 bpm  91 bpm  71 bpm  85 bpm   Oxygen Saturation (Admit)  97 %  -  -  -  -  Oxygen Saturation (Exercise)  97 %  -  -  -  -   Oxygen Saturation (Exit)  98 %  -  -  -  -   Rating of Perceived Exertion (Exercise)  13  12  14  15  12     Perceived Dyspnea (Exercise)  14  -  -  -  -   Symptoms  SOB  -  -  SOB  SOB   Comments  6MWT  first two weeks of exercise   increase in overall MET leve l  -  -   Duration  Progress to 30 minutes of  aerobic without signs/symptoms of physical distress  Continue with 30 min of aerobic exercise without signs/symptoms of physical distress.  Continue with 30 min of aerobic exercise without signs/symptoms of physical distress.  Continue with 30 min of aerobic exercise without signs/symptoms of physical distress.  Continue with 30 min of aerobic exercise without signs/symptoms of physical distress.   Intensity  THRR New [phone]  THRR unchanged  THRR unchanged  THRR unchanged  THRR unchanged     Progression   Progression  -  -  -  Continue to progress workloads to maintain intensity without signs/symptoms of physical distress.  Continue to progress workloads to maintain intensity without signs/symptoms of physical distress.   Average METs  -  -  -  3.39  3.29     Resistance Training   Training Prescription  -  Yes  Yes  Yes  Yes   Weight  -  3  3  3  4    Reps  -  10-15  10-15  10-15  10-15     Treadmill   MPH  -  2.8  3  3   3.2   Grade  -  0  0  0  0.5   Minutes  -  17  17  17  17    METs  -  3.14  3.29  3.29  3.66     Recumbant Elliptical   Level  -  1  2  2  3    RPM  -  72  75  51  68   Watts  -  110  109  62  93   Minutes  -  22  22  22  22    METs  -  3.58  5.8  3.5  5.2     Home Exercise Plan   Plans to continue exercise at  -  Home (comment)  Home (comment)  Home (comment)  Home (comment)   Frequency  -  Add 2 additional days to program exercise sessions.  Add 2 additional days to program exercise sessions.  Add 2 additional days to program exercise sessions.  Add 2 additional days to program exercise sessions.   Initial Home Exercises Provided  -  07/15/19  07/15/19  07/15/19  07/15/19   Row Name 09/30/19 1800             Response to Exercise   Blood Pressure (Admit)  136/90        Blood Pressure (Exercise)  142/90       Blood Pressure (Exit)  124/98       Heart Rate (Admit)  71 bpm       Heart Rate (Exercise)  86 bpm       Heart Rate (Exit)  80 bpm       Rating of Perceived Exertion (Exercise)  13  Symptoms  SOB       Duration  Continue with 30 min of aerobic exercise without signs/symptoms of physical distress.       Intensity  THRR unchanged         Progression   Progression  Continue to progress workloads to maintain intensity without signs/symptoms of physical distress.       Average METs  4.99         Resistance Training   Training Prescription  Yes       Weight  5       Reps  10-15         Treadmill   MPH  3.1       Grade  0.5       Minutes  17       METs  3.56         Recumbant Elliptical   Level  3       RPM  75       Watts  115       Minutes  22       METs  6.5         Home Exercise Plan   Plans to continue exercise at  Home (comment)       Frequency  Add 2 additional days to program exercise sessions.       Initial Home Exercises Provided  07/15/19          Exercise Comments:  Exercise Comments    Row Name 07/15/19 1246 07/31/19 1029 08/05/19 1536 08/14/19 1245 09/04/19 1012   Exercise Comments  Pt. has attended 7 exercise sessions so far. He has tolerated the exercise with ease, just has some shortness of breath every once in a while. We will continue to monitor and progress as needed.  Pt. continues to tolerate exercise well. He has been able to handle all increases with ease. He still complains of SOB sometimes but it's not extreme.  Khambrel continues to do well in Cardiac Rehab. He is able to do all the exercise with ease and has continued to tolerate all increases well. He is still short of breath sometimes but he is able to work through it.  Pt. continues to do well in CR when he's here. He is suffering from some SOB, pretty much at all time right now. We will keep an eye on that and progress as tolerated.  When patient  attends he soes well with his exericse prescription. We will continue to progress him as tolerated.   Mulat Name 09/30/19 1819           Exercise Comments  Even though Willaim has has severe PTSD he has done well in the program. He feels very safe in this environment and enjoys coming to class. He has had a few setbacks that caused [place] to have to slow down his progression on the machines. We are beginning to slowly add back some increases to the TM. WE will continue to monitor his increase or decrease in SOB before increasing the ellipital levels.          Exercise Goals and Review:  Exercise Goals    Row Name 06/23/19 1340             Exercise Goals   Increase Physical Activity  Yes       Intervention  Provide advice, education, support and counseling about physical activity/exercise needs.;Develop an individualized exercise prescription for aerobic and resistive training based  on initial evaluation findings, risk stratification, comorbidities and participant's personal goals.       Expected Outcomes  Short Term: Attend rehab on a regular basis to increase amount of physical activity.;Long Term: Add in home exercise to make exercise part of routine and to increase amount of physical activity.;Long Term: Exercising regularly at least 3-5 days a week.       Increase Strength and Stamina  Yes       Intervention  Provide advice, education, support and counseling about physical activity/exercise needs.;Develop an individualized exercise prescription for aerobic and resistive training based on initial evaluation findings, risk stratification, comorbidities and participant's personal goals.       Expected Outcomes  Short Term: Increase workloads from initial exercise prescription for resistance, speed, and METs.;Short Term: Perform resistance training exercises routinely during rehab and add in resistance training at home;Long Term: Improve cardiorespiratory fitness, muscular endurance and strength as  measured by increased METs and functional capacity (6MWT)       Able to understand and use rate of perceived exertion (RPE) scale  Yes       Intervention  Provide education and explanation on how to use RPE scale       Expected Outcomes  Short Term: Able to use RPE daily in rehab to express subjective intensity level;Long Term:  Able to use RPE to guide intensity level when exercising independently       Able to understand and use Dyspnea scale  Yes       Intervention  Provide education and explanation on how to use Dyspnea scale       Expected Outcomes  Short Term: Able to use Dyspnea scale daily in rehab to express subjective sense of shortness of breath during exertion;Long Term: Able to use Dyspnea scale to guide intensity level when exercising independently       Knowledge and understanding of Target Heart Rate Range (THRR)  Yes       Intervention  Provide education and explanation of THRR including how the numbers were predicted and where they are located for reference       Expected Outcomes  Short Term: Able to state/look up THRR;Long Term: Able to use THRR to govern intensity when exercising independently;Short Term: Able to use daily as guideline for intensity in rehab       Able to check pulse independently  Yes       Intervention  Provide education and demonstration on how to check pulse in carotid and radial arteries.;Review the importance of being able to check your own pulse for safety during independent exercise       Expected Outcomes  Short Term: Able to explain why pulse checking is important during independent exercise;Long Term: Able to check pulse independently and accurately       Understanding of Exercise Prescription  Yes       Intervention  Provide education, explanation, and written materials on patient's individual exercise prescription       Expected Outcomes  Short Term: Able to explain program exercise prescription;Long Term: Able to explain home exercise prescription to  exercise independently          Exercise Goals Re-Evaluation : Exercise Goals Re-Evaluation    Row Name 07/15/19 1244 08/05/19 1536 09/04/19 1005 09/30/19 1817       Exercise Goal Re-Evaluation   Exercise Goals Review  Increase Physical Activity;Increase Strength and Stamina;Able to understand and use rate of perceived exertion (RPE) scale;Able to understand and use  Dyspnea scale;Knowledge and understanding of Target Heart Rate Range (THRR);Able to check pulse independently;Understanding of Exercise Prescription  Increase Physical Activity;Increase Strength and Stamina;Able to understand and use rate of perceived exertion (RPE) scale;Able to understand and use Dyspnea scale;Knowledge and understanding of Target Heart Rate Range (THRR);Able to check pulse independently;Understanding of Exercise Prescription  Increase Physical Activity;Increase Strength and Stamina;Knowledge and understanding of Target Heart Rate Range (THRR);Able to check pulse independently;Understanding of Exercise Prescription  Increase Physical Activity;Increase Strength and Stamina    Comments  Pt. is still fairly new to the program. He has exerperience working out from [employer] so he is somewhat fit. He has tolerated all the exercise well so far.  Pt. continues to do well in the program. He has now completed 14 sessions and has done so with ease.  Patient is still attending. He does not come consistently due to family issues and his PTSD. His golas are to get rid of his SOB and be able to do things easier and to be able to do things with his grand-children. He is still experiences SOB while exercising. We will still progress him as he can tolerate.  Sallie's goas are to get rid of SOB, to be able to do things earier, do things with grandchildren w/o SOB    Expected Outcomes  Short: improve SOB Long: get back to doing the things he was before his diagnosis  Short: improve SOB Long: get back to doing the things he was before his  diagnosis  To achieve his goals stated above.  To reach his expected goals on or before graduation from the program.        Discharge Exercise Prescription (Final Exercise Prescription Changes): Exercise Prescription Changes - 09/30/19 1800      Response to Exercise   Blood Pressure (Admit)  136/90    Blood Pressure (Exercise)  142/90    Blood Pressure (Exit)  124/98    Heart Rate (Admit)  71 bpm    Heart Rate (Exercise)  86 bpm    Heart Rate (Exit)  80 bpm    Rating of Perceived Exertion (Exercise)  13    Symptoms  SOB    Duration  Continue with 30 min of aerobic exercise without signs/symptoms of physical distress.    Intensity  THRR unchanged      Progression   Progression  Continue to progress workloads to maintain intensity without signs/symptoms of physical distress.    Average METs  4.99      Resistance Training   Training Prescription  Yes    Weight  5    Reps  10-15      Treadmill   MPH  3.1    Grade  0.5    Minutes  17    METs  3.56      Recumbant Elliptical   Level  3    RPM  75    Watts  115    Minutes  22    METs  6.5      Home Exercise Plan   Plans to continue exercise at  Home (comment)    Frequency  Add 2 additional days to program exercise sessions.    Initial Home Exercises Provided  07/15/19       Nutrition:  Target Goals: Understanding of nutrition guidelines, daily intake of sodium <156m, cholesterol <2064m calories 30% from fat and 7% or less from saturated fats, daily to have 5 or more servings of fruits and vegetables.  Biometrics: Pre Biometrics - 06/23/19 1340      Pre Biometrics   Height  5' 8"  (1.727 m)    Waist Circumference  37.5 inches    Hip Circumference  39 inches    Waist to Hip Ratio  0.96 %    Triceps Skinfold  6 mm    % Body Fat  22.1 %    Grip Strength  2.3 kg   kg   Flexibility  0 in   lower back pain due to cartilage issues and fusion.   Single Leg Stand  60 seconds        Nutrition Therapy Plan and  Nutrition Goals: Nutrition Therapy & Goals - 10/01/19 1451      Personal Nutrition Goals   Comments  We have resumed our RD classes and continue to work on schedule patients safely with social distancing. His medficts diet assessment score was 42. He continues to say he is striving to eat heart healthy eating more grilled foods and less fried and greasy foods with the Thanksgiving holiday excerption. Will continue to monitor for progress.      Intervention Plan   Intervention  Nutrition handout(s) given to patient.    Expected Outcomes  Short Term Goal: Understand basic principles of dietary content, such as calories, fat, sodium, cholesterol and nutrients.       Nutrition Assessments: Nutrition Assessments - 06/23/19 1454      MEDFICTS Scores   Pre Score  42       Nutrition Goals Re-Evaluation:   Nutrition Goals Discharge (Final Nutrition Goals Re-Evaluation):   Psychosocial: Target Goals: Acknowledge presence or absence of significant depression and/or stress, maximize coping skills, provide positive support system. Participant is able to verbalize types and ability to use techniques and skills needed for reducing stress and depression.  Initial Review & Psychosocial Screening: Initial Psych Review & Screening - 06/23/19 1452      Initial Review   Current issues with  History of Depression;Current Anxiety/Panic;Current Stress Concerns    Source of Stress Concerns  None Identified    Comments  PTSD      Family Dynamics   Good Support System?  Yes      Barriers   Psychosocial barriers to participate in program  The patient should benefit from training in stress management and relaxation.      Screening Interventions   Interventions  Encouraged to exercise    Expected Outcomes  Short Term goal: Identification and review with participant of any Quality of Life or Depression concerns found by scoring the questionnaire.;Long Term goal: The participant improves quality of Life  and PHQ9 Scores as seen by post scores and/or verbalization of changes       Quality of Life Scores: Quality of Life - 06/23/19 1341      Quality of Life   Select  Quality of Life      Quality of Life Scores   Health/Function Pre  10.1 %    Socioeconomic Pre  19.38 %    Psych/Spiritual Pre  18.79 %    Family Pre  12 %    GLOBAL Pre  14.23 %      Scores of 19 and below usually indicate a poorer quality of life in these areas.  A difference of  2-3 points is a clinically meaningful difference.  A difference of 2-3 points in the total score of the Quality of Life Index has been associated with significant improvement in overall quality of  life, self-image, physical symptoms, and general health in studies assessing change in quality of life.  PHQ-9: Recent Review Flowsheet Data    Depression screen [facility] 2/9 06/23/2019 06/06/2016 12/16/2015 11/29/2015   Decreased Interest 2 0 0 0   Down, Depressed, Hopeless 1 0 0 0   PHQ - 2 Score 3 0 0 0   Altered sleeping 2 - - -   Tired, decreased energy 3 - - -   Change in appetite 1 - - -   Feeling bad or failure about yourself  2 - - -   Trouble concentrating 2 - - -   Moving slowly or fidgety/restless 3 - - -   Suicidal thoughts 2 - - -   PHQ-9 Score 18 - - -   Difficult doing work/chores Extremely dIfficult - - -     Interpretation of Total Score  Total Score Depression Severity:  1-4 = Minimal depression, 5-9 = Mild depression, 10-14 = Moderate depression, 15-19 = Moderately severe depression, 20-27 = Severe depression   Psychosocial Evaluation and Intervention: Psychosocial Evaluation - 06/23/19 1453      Psychosocial Evaluation & Interventions   Interventions  Encouraged to exercise with the program and follow exercise prescription    Continue Psychosocial Services   Follow up required by staff       Psychosocial Re-Evaluation: Psychosocial Re-Evaluation    Row Name 07/15/19 1013 08/06/19 1623 09/04/19 1255 10/01/19 1501        Psychosocial Re-Evaluation   Current issues with  History of Depression;Current Anxiety/Panic  -  History of Depression;Current Anxiety/Panic  History of Depression;Current Anxiety/Panic    Comments  Patinet's intiial QOL score was 14.23 and his PHQ-9 score was 18. He does suffer from PTSD from his service in the [employer]. His depression is managed with Citalopram 10 mg daily and he is in counseling. He feels his psychosocial issues are managed. Will continue to monitor for progress.  Patinet's intiial QOL score was 14.23 and his PHQ-9 score was 18. He does suffer from PTSD from his service in the [employer]. His depression is managed with Citalopram 10 mg daily and he is in counseling. He feels his psychosocial issues are managed. Will continue to monitor for progress.  Patinet's intiial QOL score was 14.23 and his PHQ-9 score was 18. He does suffer from PTSD from his service in the [employer]. His depression is managed with Citalopram 10 mg daily and he is in counseling. He feels his psychosocial issues are managed. He does have difficult days with his PTSD but feels like he is able to manage his symptoms. Will continue to monitor for progress.  Patient continues to suffer from PTSD from his service in the [employer]. His depression continues to be managed with Citalopram 10 mg daily.He attends a support group daily for his PTSD and also sees a Social worker and has a Neurosurgeon. He continue to feel his psychosocial issues are managed.  Will continue to monitor for progress.    Expected Outcomes  Patient will have improved QOL and PHQ-9 scores at discharage and his psychosocial issues will continue to be managed with no additional issues identified.  Patient will have improved QOL and PHQ-9 scores at discharage and his psychosocial issues will continue to be managed with no additional issues identified.  Patient will have improved QOL and PHQ-9 scores at discharage and his psychosocial issues will continue to be managed  with no additional issues identified.  Patient will have improved QOL and PHQ-9  scores at [place] and his psychosocial issues will continue to be managed with no additional issues identified.    Interventions  Stress management education;Encouraged to attend Cardiac Rehabilitation for the exercise;Relaxation education  Stress management education;Encouraged to attend Cardiac Rehabilitation for the exercise;Relaxation education  Stress management education;Encouraged to attend Cardiac Rehabilitation for the exercise;Relaxation education  Stress management education;Encouraged to attend Cardiac Rehabilitation for the exercise;Relaxation education    Continue Psychosocial Services   Follow up required by staff  Follow up required by staff  No Follow up required  Follow up required by staff       Psychosocial Discharge (Final Psychosocial Re-Evaluation): Psychosocial Re-Evaluation - 10/01/19 1501      Psychosocial Re-Evaluation   Current issues with  History of Depression;Current Anxiety/Panic    Comments  Patient continues to suffer from PTSD from his service in the [employer] His depression continues to be managed with Citalopram 10 mg daily.He attends a support group daily for his PTSD and also sees a Social worker and has a Neurosurgeon. He continue to feel his psychosocial issues are managed.  Will continue to monitor for progress.    Expected Outcomes  Patient will have improved QOL and PHQ-9 scores at discharage and his psychosocial issues will continue to be managed with no additional issues identified.    Interventions  Stress management education;Encouraged to attend Cardiac Rehabilitation for the exercise;Relaxation education    Continue Psychosocial Services   Follow up required by staff       Vocational Rehabilitation: Provide vocational rehab assistance to qualifying candidates.   Vocational Rehab Evaluation & Intervention: Vocational Rehab - 06/23/19 1455      Initial Vocational Rehab  Evaluation & Intervention   Assessment shows need for Vocational Rehabilitation  No       Education: Education Goals: Education classes will be provided on a weekly basis, covering required topics. Participant will state understanding/return demonstration of topics presented.  Learning Barriers/Preferences: Learning Barriers/Preferences - 06/23/19 1455      Learning Barriers/Preferences   Learning Barriers  None    Learning Preferences  Video;Written Material;Verbal Instruction;Individual Instruction;Group Instruction       Education Topics: Hypertension, Hypertension Reduction -Define heart disease and high blood pressure. Discus how high blood pressure affects the body and ways to reduce high blood pressure.   CARDIAC REHAB PHASE II EXERCISE from 09/17/2019 in [place]  Date  07/30/19  Educator  DC  Instruction Review Code  2- Demonstrated Understanding      Exercise and Your Heart -Discuss why it is important to exercise, the FITT principles of exercise, normal and abnormal responses to exercise, and how to exercise safely.   Angina -Discuss definition of angina, causes of angina, treatment of angina, and how to decrease risk of having angina.   CARDIAC REHAB PHASE II EXERCISE from 09/17/2019 in [place]  Date  08/13/19  Educator  DC  Instruction Review Code  2- Demonstrated Understanding      Cardiac Medications -Review what the following cardiac medications are used for, how they affect the body, and side effects that may occur when taking the medications.  Medications include Aspirin, Beta blockers, calcium channel blockers, ACE Inhibitors, angiotensin receptor blockers, diuretics, digoxin, and antihyperlipidemics.   Congestive Heart Failure -Discuss the definition of CHF, how to live with CHF, the signs and symptoms of CHF, and how keep track of weight and sodium intake.   Heart Disease and Intimacy -Discus the  effect sexual activity  has on the heart, how changes occur during intimacy as we age, and safety during sexual activity.   CARDIAC REHAB PHASE II EXERCISE from 09/17/2019 in [place]  Date  09/03/19  Educator  D. Coad  Instruction Review Code  2- Demonstrated Understanding      Smoking Cessation / COPD -Discuss different methods to quit smoking, the health benefits of quitting smoking, and the definition of COPD.   CARDIAC REHAB PHASE II EXERCISE from 09/17/2019 in [place]  Date  09/10/19  Educator  DWynetta Emery  Instruction Review Code  2- Demonstrated Understanding      Nutrition I: Fats -Discuss the types of cholesterol, what cholesterol does to the heart, and how cholesterol levels can be controlled.   CARDIAC REHAB PHASE II EXERCISE from 09/17/2019 in [place]  Date  09/17/19  Educator  DC  Instruction Review Code  2- Demonstrated Understanding      Nutrition II: Labels -Discuss the different components of food labels and how to read food label   [place] from 09/17/2019 in [place]  Date  06/25/19  Educator  DC  Instruction Review Code  1- Verbalizes Understanding      Heart Parts/Heart Disease and PAD -Discuss the anatomy of the heart, the pathway of blood circulation through the heart, and these are affected by heart disease.   CARDIAC REHAB PHASE II EXERCISE from 09/17/2019 in [place]  Date  07/02/19  Educator  DJ  Instruction Review Code  2- Demonstrated Understanding      Stress I: Signs and Symptoms -Discuss the causes of stress, how stress may lead to anxiety and depression, and ways to limit stress.   Stress II: Relaxation -Discuss different types of relaxation techniques to limit stress.   CARDIAC REHAB PHASE II EXERCISE from 09/17/2019 in [place]  Date  07/16/19  Educator  Etheleen Mayhew  Instruction Review Code  2- Demonstrated Understanding      Warning Signs of Stroke / TIA -Discuss definition of a stroke, what the signs and symptoms are of a stroke, and how to identify when someone is having stroke.   Knowledge Questionnaire Score: Knowledge Questionnaire Score - 06/23/19 1455      Knowledge Questionnaire Score   Pre Score  24/28       Core Components/Risk Factors/Patient Goals at Admission: Personal Goals and Risk Factors at Admission - 06/23/19 1455      Core Components/Risk Factors/Patient Goals on Admission    Weight Management  Weight Maintenance    Personal Goal Other  Yes    Personal Goal  GEt rid of SOB, Be able to do things easier, do more things with grandchildren.    Intervention  Attend CR 3 x times week and supplement with at home exercise 2 x week.    Expected Outcomes  Reach expected goals.       Core Components/Risk Factors/Patient Goals Review:  Goals and Risk Factor Review    Row Name 07/15/19 1011 08/06/19 1618 09/04/19 1251 10/01/19 1453       Core Components/Risk Factors/Patient Goals Review   Personal Goals Review  Weight Management/Obesity;Other;Improve shortness of breath with ADL's No SOB; get things done easier; do things with grandchildren.  Weight Management/Obesity;Other;Improve shortness of breath with ADL's No SOB; Get thing done easier; Do things with grandchildren.  Weight Management/Obesity;Other;Improve shortness of breath with ADL's No SOB; get things done  easier; do things with grandchildren.  Weight Management/Obesity;Other;Improve shortness of breath with ADL's No SOB; Get things done easier; do things with grandchildren.    Review  Patient has completed 7 sessions maintaining his weight since last 30 day review. He is doing well in the program with progression. He says he is breathing better and is now not afraid to walk and exercise some on his own. He does continue to smoke 1 pack/day cigerattes. He says he is  not trying to quit at this time. He is working toward meeting his personal goals. Will continue to monitor for progress.  Patient has completed 14 sessions losing 1 lb in past 30 days. He continues to do well in the program with continued progression. He continues to smoke cigerattes 1 p/day and expresses on interest in quitting. He reports feeling less angina and does feel stronger. He says he thinks his SOB is worsening and has talked to his cardiologist about this. He says he is doing more around the house and hopes to go hunting with his grandson soon. She ordered a chest x-ray which he completed but has not heard about the results. Will continue to monitor for progress.  Patient has completed 18 sessions gaining 4 lbs since last 30 day review. He continues to do well in the program with progression. His attendance continues to be sparatic. He has recently missed 2 weeks due to being possibly exposed to [place]. He tested negative. He continues to report SOB. He was going to have an echocardiagram. He wasn't sure of the results. His MD also ordered a chest x-ray which was unremarkable. He continues to be followed by cardiologist at [facility]. He says he is doing things around the house and has been hunting a few times. Will continue to monitor for progress.  Patient has completed 25 sessions maintaining his weight since last 30 day review. He continues to do well in the program with progression. His attendance has improved; however, he called out Monday 09/29/19 with congestion and later went to the ED with chest pain. His cardiac workup was all WNL. His rapid COVID test was negative upon admission and was tested with PCR at discharge which has not come back yet. Patient will be out until test result are in. He saw his cardiologist 2 weeks ago at the [place]. She discussed his echo results with him which showed a small pocked of fluid remaining at the apex of his heart which is probably contributing to his SOB. She said this  should resolve on its on. She increased his Lisonopril due to his elevated diastolic blood pressure readings. His diastolic readings continue to average 90 initally in CR. He does say he feels stronger and has more stamina but continues to struggle with SOB. Will continue to monitor for progress.    Expected Outcomes  Patient will continue to attend sessions meeting his personal goals.  Patient will continue to attend sessions meeting his personal goals.  Patient will continue to attend sessions meeting his personal goals.  Patient will continue to attend sessions meeting his personal goals.       Core Components/Risk Factors/Patient Goals at Discharge (Final Review):  Goals and Risk Factor Review - 10/01/19 1453      Core Components/Risk Factors/Patient Goals Review   Personal Goals Review  Weight Management/Obesity;Other;Improve shortness of breath with ADL's   No SOB; Get things done easier; do things with grandchildren.   Review  Patient has completed 25 sessions maintaining his weight  since last 30 day review. He continues to do well in the program with progression. His attendance has improved; however, he called out Monday 09/29/19 with congestion and later went to the ED with chest pain. His cardiac workup was all WNL. His rapid COVID test was negative upon admission and was tested with PCR at discharge which has not come back yet. Patient will be out until test result are in. He saw his cardiologist 2 weeks ago at the [place]. She discussed his echo results with him which showed a small pocked of fluid remaining at the apex of his heart which is probably contributing to his SOB. She said this should resolve on its on. She increased his Lisonopril due to his elevated diastolic blood pressure readings. His diastolic readings continue to average 90 initally in CR. He does say he feels stronger and has more stamina but continues to struggle with SOB. Will continue to monitor for progress.    Expected  Outcomes  Patient will continue to attend sessions meeting his personal goals.       ITP Comments: ITP Comments    Row Name 06/23/19 1443           ITP Comments  Patient has PTSD. His triggers are loud noises, people behind him, not having his back to a wall, un eased when people enter the room with out introduction. He is otherwise mild temperment.          Comments: ITP REVIEW Pt is making expected progress toward Cardiac Rehab goals after completing 25 sessions. Recommend continued exercise, life style modification, education, and increased stamina and strength.

## 2019-10-03 ENCOUNTER — Encounter (HOSPITAL_COMMUNITY): Payer: No Typology Code available for payment source

## 2019-10-06 ENCOUNTER — Encounter (HOSPITAL_COMMUNITY): Payer: No Typology Code available for payment source

## 2019-10-31 NOTE — Progress Notes (Signed)
Discharge Progress Report  Patient Details  Name: Jason Sheppard MRN: [id number] Date of Birth: [birth year] Referring Provider:     CARDIAC REHAB PHASE II ORIENTATION from 06/23/2019 in [facility]  Referring Provider  [place]       Number of Visits: 26  Reason for Discharge:  Early Exit:  Lack of attendance, [facility] authorization expiring and program cloisng due to COVID restrictions.   Smoking History:  Social History   Tobacco Use  Smoking Status Current Every Day Smoker  . Packs/day: 1.00  . Years: 14.00  . Pack years: 14.00  . Types: Cigarettes  . Last attempt to quit: 10/23/2005  . Years since quitting: 14.0  Smokeless Tobacco Never Used    Diagnosis:  Infectious pericarditis, unspecified chronicity, unspecified infectious etiology  ADL UCSD:   Initial Exercise Prescription: Initial Exercise Prescription - 06/23/19 1300      Date of Initial Exercise RX and Referring Provider   Date  06/23/19    Referring Provider  [place]    Expected Discharge Date  09/22/19      Treadmill   MPH  2    Grade  0    Minutes  17    METs  2.53      NuStep   Level  1    SPM  80    Minutes  17    METs  1.5      Prescription Details   Frequency (times per week)  3    Duration  Progress to 30 minutes of continuous aerobic without signs/symptoms of physical distress      Intensity   THRR 40-80% of Max Heartrate  [id number]    Ratings of Perceived Exertion  11-15    Perceived Dyspnea  0-4      Progression   Progression  Continue to progress workloads to maintain intensity without signs/symptoms of physical distress.      Resistance Training   Training Prescription  Yes    Weight  1    Reps  10-15       Discharge Exercise Prescription (Final Exercise Prescription Changes): Exercise Prescription Changes - 09/30/19 1800      Response to Exercise   Blood Pressure (Admit)  136/90    Blood Pressure (Exercise)  142/90    Blood Pressure (Exit)  124/98     Heart Rate (Admit)  71 bpm    Heart Rate (Exercise)  86 bpm    Heart Rate (Exit)  80 bpm    Rating of Perceived Exertion (Exercise)  13    Symptoms  SOB    Duration  Continue with 30 min of aerobic exercise without signs/symptoms of physical distress.    Intensity  THRR unchanged      Progression   Progression  Continue to progress workloads to maintain intensity without signs/symptoms of physical distress.    Average METs  4.99      Resistance Training   Training Prescription  Yes    Weight  5    Reps  10-15      Treadmill   MPH  3.1    Grade  0.5    Minutes  17    METs  3.56      Recumbant Elliptical   Level  3    RPM  75    Watts  115    Minutes  22    METs  6.5      Home Exercise Plan   Plans to  continue exercise at  Home (comment)    Frequency  Add 2 additional days to program exercise sessions.    Initial Home Exercises Provided  07/15/19       Functional Capacity: 6 Minute Walk    Row Name 06/23/19 1336         6 Minute Walk   Phase  Initial     Distance  1200 feet     Walk Time  6 minutes     # of Rest Breaks  0     MPH  2.27     METS  2.74     RPE  13     Perceived Dyspnea   14     VO2 Peak  15.05     Symptoms  No     Resting HR  71 bpm     Resting BP  130/90     Resting Oxygen Saturation   97 %     Exercise Oxygen Saturation  during 6 min walk  97 %     Max Ex. HR  85 bpm     Max Ex. BP  152/84     2 Minute Post BP  132/88        Psychological, QOL, Others - Outcomes: PHQ 2/9: Depression screen [facility] 2/9 06/23/2019 06/06/2016 12/16/2015 11/29/2015  Decreased Interest 2 0 0 0  Down, Depressed, Hopeless 1 0 0 0  PHQ - 2 Score 3 0 0 0  Altered sleeping 2 - - -  Tired, decreased energy 3 - - -  Change in appetite 1 - - -  Feeling bad or failure about yourself  2 - - -  Trouble concentrating 2 - - -  Moving slowly or fidgety/restless 3 - - -  Suicidal thoughts 2 - - -  PHQ-9 Score 18 - - -  Difficult doing work/chores Extremely dIfficult  - - -    Quality of Life: Quality of Life - 06/23/19 1341      Quality of Life   Select  Quality of Life      Quality of Life Scores   Health/Function Pre  10.1 %    Socioeconomic Pre  19.38 %    Psych/Spiritual Pre  18.79 %    Family Pre  12 %    GLOBAL Pre  14.23 %       Personal Goals: Goals established at orientation with interventions provided to work toward goal. Personal Goals and Risk Factors at Admission - 06/23/19 1455      Core Components/Risk Factors/Patient Goals on Admission    Weight Management  Weight Maintenance    Personal Goal Other  Yes    Personal Goal  GEt rid of SOB, Be able to do things easier, do more things with grandchildren.    Intervention  Attend CR 3 x times week and supplement with at home exercise 2 x week.    Expected Outcomes  Reach expected goals.        Personal Goals Discharge: Goals and Risk Factor Review    Row Name 07/15/19 1011 08/06/19 1618 09/04/19 1251 10/01/19 1453       Core Components/Risk Factors/Patient Goals Review   Personal Goals Review  Weight Management/Obesity;Other;Improve shortness of breath with ADL's No SOB; get things done easier; do things with grandchildren.  Weight Management/Obesity;Other;Improve shortness of breath with ADL's No SOB; Get thing done easier; Do things with grandchildren.  Weight Management/Obesity;Other;Improve shortness of breath with ADL's No SOB; get things  done easier; do things with grandchildren.  Weight Management/Obesity;Other;Improve shortness of breath with ADL's No SOB; Get things done easier; do things with grandchildren.    Review  Patient has completed 7 sessions maintaining his weight since last 30 day review. He is doing well in the program with progression. He says he is breathing better and is now not afraid to walk and exercise some on his own. He does continue to smoke 1 pack/day cigerattes. He says he is not trying to quit at this time. He is working toward meeting his personal  goals. Will continue to monitor for progress.  Patient has completed 14 sessions losing 1 lb in past 30 days. He continues to do well in the program with continued progression. He continues to smoke cigerattes 1 p/day and expresses on interest in quitting. He reports feeling less angina and does feel stronger. He says he thinks his SOB is worsening and has talked to his cardiologist about this. He says he is doing more around the house and hopes to go hunting with his grandson soon. She ordered a chest x-ray which he completed but has not heard about the results. Will continue to monitor for progress.  Patient has completed 18 sessions gaining 4 lbs since last 30 day review. He continues to do well in the program with progression. His attendance continues to be sparatic. He has recently missed 2 weeks due to being possibly exposed to COVID. He tested negative. He continues to report SOB. He was going to have an echocardiagram. He wasn't sure of the results. His MD also ordered a chest x-ray which was unremarkable. [facility] He says he is doing things around the house and has been hunting a few times. Will continue to monitor for progress.  Patient has completed 25 sessions maintaining his weight since last 30 day review. He continues to do well in the program with progression. His attendance has improved; however, he called out Monday 09/29/19 with congestion and later went to the ED with chest pain. His cardiac workup was all WNL. His rapid COVID test was negative upon admission and was tested with PCR at discharge which has not come back yet. Patient will be out until test result are in. He [facility]. She discussed his echo results with him which showed a small pocked of fluid remaining at the apex of his heart which is probably contributing to his SOB. She said this should resolve on its on. She increased his Lisonopril due to his elevated  diastolic blood pressure readings. His diastolic readings continue to average 90 initally in CR. He does say he feels stronger and has more stamina but continues to struggle with SOB. Will continue to monitor for progress.    Expected Outcomes  Patient will continue to attend sessions meeting his personal goals.  Patient will continue to attend sessions meeting his personal goals.  Patient will continue to attend sessions meeting his personal goals.  Patient will continue to attend sessions meeting his personal goals.       Exercise Goals and Review: Exercise Goals    Row Name 06/23/19 1340             Exercise Goals   Increase Physical Activity  Yes       Intervention  Provide advice, education, support and counseling about physical activity/exercise needs.;Develop an individualized exercise prescription for aerobic and resistive training based on  initial evaluation findings, risk stratification, comorbidities and participant's personal goals.       Expected Outcomes  Short Term: Attend rehab on a regular basis to increase amount of physical activity.;Long Term: Add in home exercise to make exercise part of routine and to increase amount of physical activity.;Long Term: Exercising regularly at least 3-5 days a week.       Increase Strength and Stamina  Yes       Intervention  Provide advice, education, support and counseling about physical activity/exercise needs.;Develop an individualized exercise prescription for aerobic and resistive training based on initial evaluation findings, risk stratification, comorbidities and participant's personal goals.       Expected Outcomes  Short Term: Increase workloads from initial exercise prescription for resistance, speed, and METs.;Short Term: Perform resistance training exercises routinely during rehab and add in resistance training at home;Long Term: Improve cardiorespiratory fitness, muscular endurance and strength as measured by increased METs and  functional capacity ( )       Able to understand and use rate of perceived exertion (RPE) scale  Yes       Intervention  Provide education and explanation on how to use RPE scale       Expected Outcomes  Short Term: Able to use RPE daily in rehab to express subjective intensity level;Long Term:  Able to use RPE to guide intensity level when exercising independently       Able to understand and use Dyspnea scale  Yes       Intervention  Provide education and explanation on how to use Dyspnea scale       Expected Outcomes  Short Term: Able to use Dyspnea scale daily in rehab to express subjective sense of shortness of breath during exertion;Long Term: Able to use Dyspnea scale to guide intensity level when exercising independently       Knowledge and understanding of Target Heart Rate Range (THRR)  Yes       Intervention  Provide education and explanation of THRR including how the numbers were predicted and where they are located for reference       Expected Outcomes  Short Term: Able to state/look up THRR;Long Term: Able to use THRR to govern intensity when exercising independently;Short Term: Able to use daily as guideline for intensity in rehab       Able to check pulse independently  Yes       Intervention  Provide education and demonstration on how to check pulse in carotid and radial arteries.;Review the importance of being able to check your own pulse for safety during independent exercise       Expected Outcomes  Short Term: Able to explain why pulse checking is important during independent exercise;Long Term: Able to check pulse independently and accurately       Understanding of Exercise Prescription  Yes       Intervention  Provide education, explanation, and written materials on patient's individual exercise prescription       Expected Outcomes  Short Term: Able to explain program exercise prescription;Long Term: Able to explain home exercise prescription to exercise independently           Exercise Goals Re-Evaluation: Exercise Goals Re-Evaluation    Row Name 07/15/19 1244 08/05/19 1536 09/04/19 1005 09/30/19 1817       Exercise Goal Re-Evaluation   Exercise Goals Review  Increase Physical Activity;Increase Strength and Stamina;Able to understand and use rate of perceived exertion (RPE) scale;Able to understand and use Dyspnea scale;Knowledge  and understanding of Target Heart Rate Range (THRR);Able to check pulse independently;Understanding of Exercise Prescription  Increase Physical Activity;Increase Strength and Stamina;Able to understand and use rate of perceived exertion (RPE) scale;Able to understand and use Dyspnea scale;Knowledge and understanding of Target Heart Rate Range (THRR);Able to check pulse independently;Understanding of Exercise Prescription  Increase Physical Activity;Increase Strength and Stamina;Knowledge and understanding of Target Heart Rate Range (THRR);Able to check pulse independently;Understanding of Exercise Prescription  Increase Physical Activity;Increase Strength and Stamina    Comments  Pt. is still fairly new to the program. He has exerperience working out from [employer] so he is somewhat fit. He has tolerated all the exercise well so far.  Pt. continues to do well in the program. He has now completed 14 sessions and has done so with ease.  Patient is still attending. He does not come consistently due to family issues and his PTSD. His golas are to get rid of his SOB and be able to do things easier and to be able to do things with his grand-children. He is still experiences SOB while exercising. We will still progress him as he can tolerate.  Walden's goas are to get rid of SOB, to be able to do things earier, do things with grandchildren w/o SOB    Expected Outcomes  Short: improve SOB Long: get back to doing the things he was before his diagnosis  Short: improve SOB Long: get back to doing the things he was before his diagnosis  To achieve his goals  stated above.  To reach his expected goals on or before graduation from the program.       Nutrition & Weight - Outcomes: Pre Biometrics - 06/23/19 1340      Pre Biometrics   Height  5\' 8"  (1.727 m)    Waist Circumference  37.5 inches    Hip Circumference  39 inches    Waist to Hip Ratio  0.96 %    Triceps Skinfold  6 mm    % Body Fat  22.1 %    Grip Strength  2.3 kg   kg   Flexibility  0 in   lower back pain due to cartilage issues and fusion.   Single Leg Stand  60 seconds        Nutrition: Nutrition Therapy & Goals - 10/01/19 1451      Personal Nutrition Goals   Comments  We have resumed our RD classes and continue to work on schedule patients safely with social distancing. His medficts diet assessment score was 42. He continues to say he is striving to eat heart healthy eating more grilled foods and less fried and greasy foods with the Thanksgiving holiday excerption. Will continue to monitor for progress.      Intervention Plan   Intervention  Nutrition handout(s) given to patient.    Expected Outcomes  Short Term Goal: Understand basic principles of dietary content, such as calories, fat, sodium, cholesterol and nutrients.       Nutrition Discharge: Nutrition Assessments - 06/23/19 1454      MEDFICTS Scores   Pre Score  42       Education Questionnaire Score: Knowledge Questionnaire Score - 06/23/19 1455      Knowledge Questionnaire Score   Pre Score  24/28

## 2019-10-31 NOTE — Addendum Note (Signed)
Encounter addended by: Suann Larry, RN on: 10/31/2019 12:08 PM  Actions taken: Flowsheet accepted, Clinical Note Signed, Episode resolved

## 2019-10-31 NOTE — Progress Notes (Signed)
Cardiac Individual Treatment Plan  Patient Details  Name: Jason Sheppard MRN: [id number] Date of Birth: 1980-11-12 Referring Provider:     [facility] from 06/23/2019 in [place]  Referring Provider  [place]      Initial Encounter Date:    CARDIAC REHAB PHASE II ORIENTATION from 06/23/2019 in [place]  Date  06/23/19      Visit Diagnosis: Infectious pericarditis, unspecified chronicity, unspecified infectious etiology  Patient's Home Medications on Admission:  Current Outpatient Medications:  .  bictegravir-emtricitabine-tenofovir AF (BIKTARVY) 50-200-25 MG TABS tablet, Take 1 tablet by mouth every morning. , Disp: , Rfl:  .  citalopram (CELEXA) 10 MG tablet, Take 20 mg by mouth daily. , Disp: , Rfl:  .  hydrOXYzine (VISTARIL) 25 MG capsule, Take 25 mg by mouth every 6 (six) hours as needed (extreme anxiety)., Disp: , Rfl:  .  lisinopril (ZESTRIL) 5 MG tablet, Take 10 mg by mouth daily. , Disp: , Rfl:  .  montelukast (SINGULAIR) 10 MG tablet, Take 1 tablet (10 mg total) by mouth every morning., Disp: , Rfl:  .  nitroGLYCERIN (NITROSTAT) 0.4 MG SL tablet, Place 1 tablet (0.4 mg total) under the tongue every 5 (five) minutes as needed for chest pain., Disp: 30 tablet, Rfl: 0 .  OXcarbazepine (TRILEPTAL) 150 MG tablet, Take 300 mg by mouth 2 (two) times daily. , Disp: , Rfl:  .  pantoprazole (PROTONIX) 40 MG tablet, Take 1 tablet (40 mg total) by mouth daily. (Patient taking differently: Take 40 mg by mouth every evening. ), Disp: 5 tablet, Rfl: 0 .  prazosin (MINIPRESS) 2 MG capsule, Take 2 mg by mouth at bedtime., Disp: , Rfl:   Past Medical History: Past Medical History:  Diagnosis Date  . Allergy   . Attention deficit disorder with hyperactivity   . Depression   . HIV (human immunodeficiency virus infection) ([place])   . Infective pericarditis   . Migraine   . OSA (obstructive sleep apnea)   . Plantar fascial  fibromatosis   . PTSD (post-traumatic stress disorder)   . Seizure disorder ([place])     Tobacco Use: Social History   Tobacco Use  Smoking Status Current Every Day Smoker  . Packs/day: 1.00  . Years: 14.00  . Pack years: 14.00  . Types: Cigarettes  . Last attempt to quit: 10/23/2005  . Years since quitting: 14.0  Smokeless Tobacco Never Used    Labs: Recent Review Flowsheet Data    Labs for ITP Cardiac and Pulmonary Rehab Latest Ref Rng & Units 12/07/2015 05/23/2016 11/28/2016 05/09/2017 02/25/2019   Cholestrol <200 mg/dL 202(H) 187 178 202(H) -   LDLCALC <100 mg/dL 132(H) 87 75 95 -   HDL >40 mg/dL 30(L) 27(L) 27(L) 31(L) -   Trlycerides <150 mg/dL 198(H) 365(H) 382(H) 380(H) -   Hemoglobin A1c 4.8 - 5.6 % - - - - 5.7(H)      Capillary Blood Glucose: No results found for: GLUCAP   Exercise Target Goals: Exercise Program Goal: Individual exercise prescription set using results from initial 6 min walk test and THRR while considering  patient's activity barriers and safety.   Exercise Prescription Goal: Starting with aerobic activity 30 plus minutes a day, 3 days per week for initial exercise prescription. Provide home exercise prescription and guidelines that participant acknowledges understanding prior to discharge.  Activity Barriers & Risk Stratification: Activity Barriers & Cardiac Risk Stratification - 06/23/19 1337  Activity Barriers & Cardiac Risk Stratification   Activity Barriers  Back Problems;Shortness of Breath;Muscular Weakness    Cardiac Risk Stratification  High       6 Minute Walk: 6 Minute Walk    Row Name 06/23/19 1336         6 Minute Walk   Phase  Initial     Distance  1200 feet     Walk Time  6 minutes     # of Rest Breaks  0     MPH  2.27     METS  2.74     RPE  13     Perceived Dyspnea   14     VO2 Peak  15.05     Symptoms  No     Resting HR  71 bpm     Resting BP  130/90     Resting Oxygen Saturation   97 %     Exercise Oxygen  Saturation  during 6 min walk  97 %     Max Ex. HR  85 bpm     Max Ex. BP  152/84     2 Minute Post BP  132/88        Oxygen Initial Assessment:   Oxygen Re-Evaluation:   Oxygen Discharge (Final Oxygen Re-Evaluation):   Initial Exercise Prescription: Initial Exercise Prescription - 06/23/19 1300      Date of Initial Exercise RX and Referring Provider   Date  06/23/19    Referring Provider  [place]    Expected Discharge Date  09/22/19      Treadmill   MPH  2    Grade  0    Minutes  17    METs  2.53      NuStep   Level  1    SPM  80    Minutes  17    METs  1.5      Prescription Details   Frequency (times per week)  3    Duration  Progress to 30 minutes of continuous aerobic without signs/symptoms of physical distress      Intensity   THRR 40-80% of Max Heartrate  [phone]    Ratings of Perceived Exertion  11-15    Perceived Dyspnea  0-4      Progression   Progression  Continue to progress workloads to maintain intensity without signs/symptoms of physical distress.      Resistance Training   Training Prescription  Yes    Weight  1    Reps  10-15       Perform Capillary Blood Glucose checks as needed.  Exercise Prescription Changes:  Exercise Prescription Changes    Row Name 06/23/19 1300 07/15/19 1200 07/31/19 1000 08/14/19 1200 09/04/19 0900     Response to Exercise   Blood Pressure (Admit)  130/90  128/82  110/84  150/82  130/70   Blood Pressure (Exercise)  152/84  144/80  130/88  160/72  128/88   Blood Pressure (Exit)  132/88  118/80  128/90  122/80  120/84   Heart Rate (Admit)  71 bpm  70 bpm  72 bpm  63 bpm  76 bpm   Heart Rate (Exercise)  85 bpm  96 bpm  104 bpm  104 bpm  102 bpm   Heart Rate (Exit)  77 bpm  79 bpm  91 bpm  71 bpm  85 bpm   Oxygen Saturation (Admit)  97 %  --  --  --  --  Oxygen Saturation (Exercise)  97 %  --  --  --  --   Oxygen Saturation (Exit)  98 %  --  --  --  --   Rating of Perceived Exertion (Exercise)  13  12   14  15  12    Perceived Dyspnea (Exercise)  14  --  --  --  --   Symptoms  SOB  --  --  SOB  SOB   Comments  6MWT  first two weeks of exercise   increase in overall MET leve l  --  --   Duration  Progress to 30 minutes of  aerobic without signs/symptoms of physical distress  Continue with 30 min of aerobic exercise without signs/symptoms of physical distress.  Continue with 30 min of aerobic exercise without signs/symptoms of physical distress.  Continue with 30 min of aerobic exercise without signs/symptoms of physical distress.  Continue with 30 min of aerobic exercise without signs/symptoms of physical distress.   Intensity  THRR New [id number]  THRR unchanged  THRR unchanged  THRR unchanged  THRR unchanged     Progression   Progression  --  --  --  Continue to progress workloads to maintain intensity without signs/symptoms of physical distress.  Continue to progress workloads to maintain intensity without signs/symptoms of physical distress.   Average METs  --  --  --  3.39  3.29     Resistance Training   Training Prescription  --  Yes  Yes  Yes  Yes   Weight  --  3  3  3  4    Reps  --  10-15  10-15  10-15  10-15     Treadmill   MPH  --  2.8  3  3   3.2   Grade  --  0  0  0  0.5   Minutes  --  17  17  17  17    METs  --  3.14  3.29  3.29  3.66     Recumbant Elliptical   Level  --  1  2  2  3    RPM  --  72  75  51  68   Watts  --  110  109  62  93   Minutes  --  22  22  22  22    METs  --  3.58  5.8  3.5  5.2     Home Exercise Plan   Plans to continue exercise at  --  Home (comment)  Home (comment)  Home (comment)  Home (comment)   Frequency  --  Add 2 additional days to program exercise sessions.  Add 2 additional days to program exercise sessions.  Add 2 additional days to program exercise sessions.  Add 2 additional days to program exercise sessions.   Initial Home Exercises Provided  --  07/15/19  07/15/19  07/15/19  07/15/19   Row Name 09/30/19 1800             Response to  Exercise   Blood Pressure (Admit)  136/90       Blood Pressure (Exercise)  142/90       Blood Pressure (Exit)  124/98       Heart Rate (Admit)  71 bpm       Heart Rate (Exercise)  86 bpm       Heart Rate (Exit)  80 bpm       Rating of Perceived Exertion (Exercise)  13  Symptoms  SOB       Duration  Continue with 30 min of aerobic exercise without signs/symptoms of physical distress.       Intensity  THRR unchanged         Progression   Progression  Continue to progress workloads to maintain intensity without signs/symptoms of physical distress.       Average METs  4.99         Resistance Training   Training Prescription  Yes       Weight  5       Reps  10-15         Treadmill   MPH  3.1       Grade  0.5       Minutes  17       METs  3.56         Recumbant Elliptical   Level  3       RPM  75       Watts  115       Minutes  22       METs  6.5         Home Exercise Plan   Plans to continue exercise at  Home (comment)       Frequency  Add 2 additional days to program exercise sessions.       Initial Home Exercises Provided  07/15/19          Exercise Comments:  Exercise Comments    Row Name 07/15/19 1246 07/31/19 1029 08/05/19 1536 08/14/19 1245 09/04/19 1012   Exercise Comments  Pt. has attended 7 exercise sessions so far. He has tolerated the exercise with ease, just has some shortness of breath every once in a while. We will continue to monitor and progress as needed.  Pt. continues to tolerate exercise well. He has been able to handle all increases with ease. He still complains of SOB sometimes but it's not extreme.  Rohin continues to do well in Cardiac Rehab. He is able to do all the exercise with ease and has continued to tolerate all increases well. He is still short of breath sometimes but he is able to work through it.  Pt. continues to do well in CR when he's here. He is suffering from some SOB, pretty much at all time right now. We will keep an eye on that and  progress as tolerated.  When patient attends he soes well with his exericse prescription. We will continue to progress him as tolerated.   [employer] Name 09/30/19 1819           Exercise Comments  Even though Delaney has has severe PTSD he has done well in the program. He feels very safe in this environment and enjoys coming to class. He has had a few setbacks that caused [place] to have to slow down his progression on the machines. We are beginning to slowly add back some increases to the TM. WE will continue to monitor his increase or decrease in SOB before increasing the ellipital levels.          Exercise Goals and Review:  Exercise Goals    Row Name 06/23/19 1340             Exercise Goals   Increase Physical Activity  Yes       Intervention  Provide advice, education, support and counseling about physical activity/exercise needs.;Develop an individualized exercise prescription for aerobic and resistive training based  on initial evaluation findings, risk stratification, comorbidities and participant's personal goals.       Expected Outcomes  Short Term: Attend rehab on a regular basis to increase amount of physical activity.;Long Term: Add in home exercise to make exercise part of routine and to increase amount of physical activity.;Long Term: Exercising regularly at least 3-5 days a week.       Increase Strength and Stamina  Yes       Intervention  Provide advice, education, support and counseling about physical activity/exercise needs.;Develop an individualized exercise prescription for aerobic and resistive training based on initial evaluation findings, risk stratification, comorbidities and participant's personal goals.       Expected Outcomes  Short Term: Increase workloads from initial exercise prescription for resistance, speed, and METs.;Short Term: Perform resistance training exercises routinely during rehab and add in resistance training at home;Long Term: Improve cardiorespiratory fitness,  muscular endurance and strength as measured by increased METs and functional capacity (6MWT)       Able to understand and use rate of perceived exertion (RPE) scale  Yes       Intervention  Provide education and explanation on how to use RPE scale       Expected Outcomes  Short Term: Able to use RPE daily in rehab to express subjective intensity level;Long Term:  Able to use RPE to guide intensity level when exercising independently       Able to understand and use Dyspnea scale  Yes       Intervention  Provide education and explanation on how to use Dyspnea scale       Expected Outcomes  Short Term: Able to use Dyspnea scale daily in rehab to express subjective sense of shortness of breath during exertion;Long Term: Able to use Dyspnea scale to guide intensity level when exercising independently       Knowledge and understanding of Target Heart Rate Range (THRR)  Yes       Intervention  Provide education and explanation of THRR including how the numbers were predicted and where they are located for reference       Expected Outcomes  Short Term: Able to state/look up THRR;Long Term: Able to use THRR to govern intensity when exercising independently;Short Term: Able to use daily as guideline for intensity in rehab       Able to check pulse independently  Yes       Intervention  Provide education and demonstration on how to check pulse in carotid and radial arteries.;Review the importance of being able to check your own pulse for safety during independent exercise       Expected Outcomes  Short Term: Able to explain why pulse checking is important during independent exercise;Long Term: Able to check pulse independently and accurately       Understanding of Exercise Prescription  Yes       Intervention  Provide education, explanation, and written materials on patient's individual exercise prescription       Expected Outcomes  Short Term: Able to explain program exercise prescription;Long Term: Able to  explain home exercise prescription to exercise independently          Exercise Goals Re-Evaluation : Exercise Goals Re-Evaluation    Row Name 07/15/19 1244 08/05/19 1536 09/04/19 1005 09/30/19 1817       Exercise Goal Re-Evaluation   Exercise Goals Review  Increase Physical Activity;Increase Strength and Stamina;Able to understand and use rate of perceived exertion (RPE) scale;Able to understand and use  Dyspnea scale;Knowledge and understanding of Target Heart Rate Range (THRR);Able to check pulse independently;Understanding of Exercise Prescription  Increase Physical Activity;Increase Strength and Stamina;Able to understand and use rate of perceived exertion (RPE) scale;Able to understand and use Dyspnea scale;Knowledge and understanding of Target Heart Rate Range (THRR);Able to check pulse independently;Understanding of Exercise Prescription  Increase Physical Activity;Increase Strength and Stamina;Knowledge and understanding of Target Heart Rate Range (THRR);Able to check pulse independently;Understanding of Exercise Prescription  Increase Physical Activity;Increase Strength and Stamina    Comments  Pt. is still fairly new to the program. He has exerperience working out from [employer] so he is somewhat fit. He has tolerated all the exercise well so far.  Pt. continues to do well in the program. He has now completed 14 sessions and has done so with ease.  Patient is still attending. He does not come consistently due to family issues and his PTSD. His golas are to get rid of his SOB and be able to do things easier and to be able to do things with his grand-children. He is still experiences SOB while exercising. We will still progress him as he can tolerate.  Teejay's goas are to get rid of SOB, to be able to do things earier, do things with grandchildren w/o SOB    Expected Outcomes  Short: improve SOB Long: get back to doing the things he was before his diagnosis  Short: improve SOB Long: get back to  doing the things he was before his diagnosis  To achieve his goals stated above.  To reach his expected goals on or before graduation from the program.        Discharge Exercise Prescription (Final Exercise Prescription Changes): Exercise Prescription Changes - 09/30/19 1800      Response to Exercise   Blood Pressure (Admit)  136/90    Blood Pressure (Exercise)  142/90    Blood Pressure (Exit)  124/98    Heart Rate (Admit)  71 bpm    Heart Rate (Exercise)  86 bpm    Heart Rate (Exit)  80 bpm    Rating of Perceived Exertion (Exercise)  13    Symptoms  SOB    Duration  Continue with 30 min of aerobic exercise without signs/symptoms of physical distress.    Intensity  THRR unchanged      Progression   Progression  Continue to progress workloads to maintain intensity without signs/symptoms of physical distress.    Average METs  4.99      Resistance Training   Training Prescription  Yes    Weight  5    Reps  10-15      Treadmill   MPH  3.1    Grade  0.5    Minutes  17    METs  3.56      Recumbant Elliptical   Level  3    RPM  75    Watts  115    Minutes  22    METs  6.5      Home Exercise Plan   Plans to continue exercise at  Home (comment)    Frequency  Add 2 additional days to program exercise sessions.    Initial Home Exercises Provided  07/15/19       Nutrition:  Target Goals: Understanding of nutrition guidelines, daily intake of sodium <1568m, cholesterol <2086m calories 30% from fat and 7% or less from saturated fats, daily to have 5 or more servings of fruits and vegetables.  Biometrics: Pre Biometrics - 06/23/19 1340      Pre Biometrics   Height  5' 8"  (1.727 m)    Waist Circumference  37.5 inches    Hip Circumference  39 inches    Waist to Hip Ratio  0.96 %    Triceps Skinfold  6 mm    % Body Fat  22.1 %    Grip Strength  2.3 kg   kg   Flexibility  0 in   lower back pain due to cartilage issues and fusion.   Single Leg Stand  60 seconds         Nutrition Therapy Plan and Nutrition Goals: Nutrition Therapy & Goals - 10/01/19 1451      Personal Nutrition Goals   Comments  We have resumed our RD classes and continue to work on schedule patients safely with social distancing. His medficts diet assessment score was 42. He continues to say he is striving to eat heart healthy eating more grilled foods and less fried and greasy foods with the Thanksgiving holiday excerption. Will continue to monitor for progress.      Intervention Plan   Intervention  Nutrition handout(s) given to patient.    Expected Outcomes  Short Term Goal: Understand basic principles of dietary content, such as calories, fat, sodium, cholesterol and nutrients.       Nutrition Assessments: Nutrition Assessments - 06/23/19 1454      MEDFICTS Scores   Pre Score  42       Nutrition Goals Re-Evaluation:   Nutrition Goals Discharge (Final Nutrition Goals Re-Evaluation):   Psychosocial: Target Goals: Acknowledge presence or absence of significant depression and/or stress, maximize coping skills, provide positive support system. Participant is able to verbalize types and ability to use techniques and skills needed for reducing stress and depression.  Initial Review & Psychosocial Screening: Initial Psych Review & Screening - 06/23/19 1452      Initial Review   Current issues with  History of Depression;Current Anxiety/Panic;Current Stress Concerns    Source of Stress Concerns  None Identified    Comments  PTSD      Family Dynamics   Good Support System?  Yes      Barriers   Psychosocial barriers to participate in program  The patient should benefit from training in stress management and relaxation.      Screening Interventions   Interventions  Encouraged to exercise    Expected Outcomes  Short Term goal: Identification and review with participant of any Quality of Life or Depression concerns found by scoring the questionnaire.;Long Term goal: The  participant improves quality of Life and PHQ9 Scores as seen by post scores and/or verbalization of changes       Quality of Life Scores: Quality of Life - 06/23/19 1341      Quality of Life   Select  Quality of Life      Quality of Life Scores   Health/Function Pre  10.1 %    Socioeconomic Pre  19.38 %    Psych/Spiritual Pre  18.79 %    Family Pre  12 %    GLOBAL Pre  14.23 %      Scores of 19 and below usually indicate a poorer quality of life in these areas.  A difference of  2-3 points is a clinically meaningful difference.  A difference of 2-3 points in the total score of the Quality of Life Index has been associated with significant improvement in overall quality of  life, self-image, physical symptoms, and general health in studies assessing change in quality of life.  PHQ-9: Recent Review Flowsheet Data    Depression screen [facility] 2/9 06/23/2019 06/06/2016 12/16/2015 11/29/2015   Decreased Interest 2 0 0 0   Down, Depressed, Hopeless 1 0 0 0   PHQ - 2 Score 3 0 0 0   Altered sleeping 2 - - -   Tired, decreased energy 3 - - -   Change in appetite 1 - - -   Feeling bad or failure about yourself  2 - - -   Trouble concentrating 2 - - -   Moving slowly or fidgety/restless 3 - - -   Suicidal thoughts 2 - - -   PHQ-9 Score 18 - - -   Difficult doing work/chores Extremely dIfficult - - -     Interpretation of Total Score  Total Score Depression Severity:  1-4 = Minimal depression, 5-9 = Mild depression, 10-14 = Moderate depression, 15-19 = Moderately severe depression, 20-27 = Severe depression   Psychosocial Evaluation and Intervention: Psychosocial Evaluation - 06/23/19 1453      Psychosocial Evaluation & Interventions   Interventions  Encouraged to exercise with the program and follow exercise prescription    Continue Psychosocial Services   Follow up required by staff       Psychosocial Re-Evaluation: Psychosocial Re-Evaluation    Row Name 07/15/19 1013 08/06/19 1623  09/04/19 1255 10/01/19 1501       Psychosocial Re-Evaluation   Current issues with  History of Depression;Current Anxiety/Panic  --  History of Depression;Current Anxiety/Panic  History of Depression;Current Anxiety/Panic    Comments  Patinet's intiial QOL score was 14.23 and his PHQ-9 score was 18. He does suffer from PTSD from his service in the [employer]. His depression is managed with Citalopram 10 mg daily and he is in counseling. He feels his psychosocial issues are managed. Will continue to monitor for progress.  Patinet's intiial QOL score was 14.23 and his PHQ-9 score was 18. He does suffer from PTSD from his service in the [employer]. His depression is managed with Citalopram 10 mg daily and he is in counseling. He feels his psychosocial issues are managed. Will continue to monitor for progress.  Patinet's intiial QOL score was 14.23 and his PHQ-9 score was 18. He does suffer from PTSD from his service in the [employer]. His depression is managed with Citalopram 10 mg daily and he is in counseling. He feels his psychosocial issues are managed. He does have difficult days with his PTSD but feels like he is able to manage his symptoms. Will continue to monitor for progress.  Patient continues to suffer from PTSD from his service in the [employer]. His depression continues to be managed with Citalopram 10 mg daily.He attends a support group daily for his PTSD and also sees a Social worker and has a Neurosurgeon. He continue to feel his psychosocial issues are managed.  Will continue to monitor for progress.    Expected Outcomes  Patient will have improved QOL and PHQ-9 scores at discharage and his psychosocial issues will continue to be managed with no additional issues identified.  Patient will have improved QOL and PHQ-9 scores at discharage and his psychosocial issues will continue to be managed with no additional issues identified.  Patient will have improved QOL and PHQ-9 scores at discharage and his  psychosocial issues will continue to be managed with no additional issues identified.  Patient will have improved QOL and PHQ-9  scores at [place] and his psychosocial issues will continue to be managed with no additional issues identified.    Interventions  Stress management education;Encouraged to attend Cardiac Rehabilitation for the exercise;Relaxation education  Stress management education;Encouraged to attend Cardiac Rehabilitation for the exercise;Relaxation education  Stress management education;Encouraged to attend Cardiac Rehabilitation for the exercise;Relaxation education  Stress management education;Encouraged to attend Cardiac Rehabilitation for the exercise;Relaxation education    Continue Psychosocial Services   Follow up required by staff  Follow up required by staff  No Follow up required  Follow up required by staff       Psychosocial Discharge (Final Psychosocial Re-Evaluation): Psychosocial Re-Evaluation - 10/01/19 1501      Psychosocial Re-Evaluation   Current issues with  History of Depression;Current Anxiety/Panic    Comments  Patient continues to suffer from PTSD from his service in the [employer]. His depression continues to be managed with Citalopram 10 mg daily.He attends a support group daily for his PTSD and also sees a Social worker and has a Neurosurgeon. He continue to feel his psychosocial issues are managed.  Will continue to monitor for progress.    Expected Outcomes  Patient will have improved QOL and PHQ-9 scores at discharage and his psychosocial issues will continue to be managed with no additional issues identified.    Interventions  Stress management education;Encouraged to attend Cardiac Rehabilitation for the exercise;Relaxation education    Continue Psychosocial Services   Follow up required by staff       Vocational Rehabilitation: Provide vocational rehab assistance to qualifying candidates.   Vocational Rehab Evaluation & Intervention: Vocational Rehab  - 06/23/19 1455      Initial Vocational Rehab Evaluation & Intervention   Assessment shows need for Vocational Rehabilitation  No       Education: Education Goals: Education classes will be provided on a weekly basis, covering required topics. Participant will state understanding/return demonstration of topics presented.  Learning Barriers/Preferences: Learning Barriers/Preferences - 06/23/19 1455      Learning Barriers/Preferences   Learning Barriers  None    Learning Preferences  Video;Written Material;Verbal Instruction;Individual Instruction;Group Instruction       Education Topics: Hypertension, Hypertension Reduction -Define heart disease and high blood pressure. Discus how high blood pressure affects the body and ways to reduce high blood pressure.   CARDIAC REHAB PHASE II EXERCISE from 09/17/2019 in [place]  Date  07/30/19  Educator  DC  Instruction Review Code  2- Demonstrated Understanding      Exercise and Your Heart -Discuss why it is important to exercise, the FITT principles of exercise, normal and abnormal responses to exercise, and how to exercise safely.   Angina -Discuss definition of angina, causes of angina, treatment of angina, and how to decrease risk of having angina.   CARDIAC REHAB PHASE II EXERCISE from 09/17/2019 in [place]  Date  08/13/19  Educator  DC  Instruction Review Code  2- Demonstrated Understanding      Cardiac Medications -Review what the following cardiac medications are used for, how they affect the body, and side effects that may occur when taking the medications.  Medications include Aspirin, Beta blockers, calcium channel blockers, ACE Inhibitors, angiotensin receptor blockers, diuretics, digoxin, and antihyperlipidemics.   Congestive Heart Failure -Discuss the definition of CHF, how to live with CHF, the signs and symptoms of CHF, and how keep track of weight and sodium  intake.   Heart Disease and Intimacy -Discus the effect sexual activity  has on the heart, how changes occur during intimacy as we age, and safety during sexual activity.   CARDIAC REHAB PHASE II EXERCISE from 09/17/2019 in [place]  Date  09/03/19  Educator  D. Coad  Instruction Review Code  2- Demonstrated Understanding      Smoking Cessation / COPD -Discuss different methods to quit smoking, the health benefits of quitting smoking, and the definition of COPD.   CARDIAC REHAB PHASE II EXERCISE from 09/17/2019 in [place]  Date  09/10/19  Educator  DWynetta Emery  Instruction Review Code  2- Demonstrated Understanding      Nutrition I: Fats -Discuss the types of cholesterol, what cholesterol does to the heart, and how cholesterol levels can be controlled.   CARDIAC REHAB PHASE II EXERCISE from 09/17/2019 in [place]  Date  09/17/19  Educator  DC  Instruction Review Code  2- Demonstrated Understanding      Nutrition II: Labels -Discuss the different components of food labels and how to read food label   [place] from 09/17/2019 in [place]  Date  06/25/19  Educator  DC  Instruction Review Code  1- Verbalizes Understanding      Heart Parts/Heart Disease and PAD -Discuss the anatomy of the heart, the pathway of blood circulation through the heart, and these are affected by heart disease.   CARDIAC REHAB PHASE II EXERCISE from 09/17/2019 in [place]  Date  07/02/19  Educator  DJ  Instruction Review Code  2- Demonstrated Understanding      Stress I: Signs and Symptoms -Discuss the causes of stress, how stress may lead to anxiety and depression, and ways to limit stress.   Stress II: Relaxation -Discuss different types of relaxation techniques to limit stress.   CARDIAC REHAB PHASE II EXERCISE from 09/17/2019 in [place]  Date  07/16/19  Educator  Etheleen Mayhew  Instruction Review Code  2- Demonstrated Understanding      Warning Signs of Stroke / TIA -Discuss definition of a stroke, what the signs and symptoms are of a stroke, and how to identify when someone is having stroke.   Knowledge Questionnaire Score: Knowledge Questionnaire Score - 06/23/19 1455      Knowledge Questionnaire Score   Pre Score  24/28       Core Components/Risk Factors/Patient Goals at Admission: Personal Goals and Risk Factors at Admission - 06/23/19 1455      Core Components/Risk Factors/Patient Goals on Admission    Weight Management  Weight Maintenance    Personal Goal Other  Yes    Personal Goal  GEt rid of SOB, Be able to do things easier, do more things with grandchildren.    Intervention  Attend CR 3 x times week and supplement with at home exercise 2 x week.    Expected Outcomes  Reach expected goals.       Core Components/Risk Factors/Patient Goals Review:  Goals and Risk Factor Review    Row Name 07/15/19 1011 08/06/19 1618 09/04/19 1251 10/01/19 1453       Core Components/Risk Factors/Patient Goals Review   Personal Goals Review  Weight Management/Obesity;Other;Improve shortness of breath with ADL's No SOB; get things done easier; do things with grandchildren.  Weight Management/Obesity;Other;Improve shortness of breath with ADL's No SOB; Get thing done easier; Do things with grandchildren.  Weight Management/Obesity;Other;Improve shortness of breath with ADL's No SOB; get things done  easier; do things with grandchildren.  Weight Management/Obesity;Other;Improve shortness of breath with ADL's No SOB; Get things done easier; do things with grandchildren.    Review  Patient has completed 7 sessions maintaining his weight since last 30 day review. He is doing well in the program with progression. He says he is breathing better and is now not afraid to walk and exercise some on his own. He does  continue to smoke 1 pack/day cigerattes. He says he is not trying to quit at this time. He is working toward meeting his personal goals. Will continue to monitor for progress.  Patient has completed 14 sessions losing 1 lb in past 30 days. He continues to do well in the program with continued progression. He continues to smoke cigerattes 1 p/day and expresses on interest in quitting. He reports feeling less angina and does feel stronger. He says he thinks his SOB is worsening and has talked to his cardiologist about this. He says he is doing more around the house and hopes to go hunting with his grandson soon. She ordered a chest x-ray which he completed but has not heard about the results. Will continue to monitor for progress.  Patient has completed 18 sessions gaining 4 lbs since last 30 day review. He continues to do well in the program with progression. His attendance continues to be sparatic. He has recently missed 2 weeks due to being possibly exposed to [place]. He tested negative. He continues to report SOB. He was going to have an echocardiagram. He wasn't sure of the results. His MD also ordered a chest x-ray which was unremarkable. He continues to be followed by cardiologist at [facility]. He says he is doing things around the house and has been hunting a few times. Will continue to monitor for progress.  Patient has completed 25 sessions maintaining his weight since last 30 day review. He continues to do well in the program with progression. His attendance has improved; however, he called out Monday 09/29/19 with congestion and later went to the ED with chest pain. His cardiac workup was all WNL. His rapid COVID test was negative upon admission and was tested with PCR at discharge which has not come back yet. Patient will be out until test result are in. He saw his cardiologist 2 weeks ago at the [place]. She discussed his echo results with him which showed a small pocked of fluid remaining at the apex of his heart  which is probably contributing to his SOB. She said this should resolve on its on. She increased his Lisonopril due to his elevated diastolic blood pressure readings. His diastolic readings continue to average 90 initally in CR. He does say he feels stronger and has more stamina but continues to struggle with SOB. Will continue to monitor for progress.    Expected Outcomes  Patient will continue to attend sessions meeting his personal goals.  Patient will continue to attend sessions meeting his personal goals.  Patient will continue to attend sessions meeting his personal goals.  Patient will continue to attend sessions meeting his personal goals.       Core Components/Risk Factors/Patient Goals at Discharge (Final Review):  Goals and Risk Factor Review - 10/01/19 1453      Core Components/Risk Factors/Patient Goals Review   Personal Goals Review  Weight Management/Obesity;Other;Improve shortness of breath with ADL's   No SOB; Get things done easier; do things with grandchildren.   Review  Patient has completed 25 sessions maintaining his weight  since last 30 day review. He continues to do well in the program with progression. His attendance has improved; however, he called out Monday 09/29/19 with congestion and later went to the ED with chest pain. His cardiac workup was all WNL. His rapid COVID test was negative upon admission and was tested with PCR at discharge which has not come back yet. Patient will be out until test result are in. He saw his cardiologist 2 weeks ago at the [place]. She discussed his echo results with him which showed a small pocked of fluid remaining at the apex of his heart which is probably contributing to his SOB. She said this should resolve on its on. She increased his Lisonopril due to his elevated diastolic blood pressure readings. His diastolic readings continue to average 90 initally in CR. He does say he feels stronger and has more stamina but continues to struggle with SOB.  Will continue to monitor for progress.    Expected Outcomes  Patient will continue to attend sessions meeting his personal goals.       ITP Comments: ITP Comments    Row Name 06/23/19 1443 10/31/19 1158         ITP Comments  Patient has PTSD. His triggers are loud noises, people behind him, not having his back to a wall, un eased when people enter the room with out introduction. He is otherwise mild temperment.  Patient completed 26 sessions. He stopped attending due to the department closing and his authorization expiring. He also has a viral infection causing him to miss a lot of sessions. MD will be notified.         Comments: Patient stopped coming to Cardiac Rehab on        10/20/19. He completed 26 sessions. Doctor will be informed.

## 2020-02-16 ENCOUNTER — Emergency Department (HOSPITAL_COMMUNITY): Payer: No Typology Code available for payment source

## 2020-02-16 ENCOUNTER — Other Ambulatory Visit: Payer: Self-pay

## 2020-02-16 ENCOUNTER — Emergency Department (HOSPITAL_COMMUNITY)
Admission: EM | Admit: 2020-02-16 | Discharge: 2020-02-16 | Disposition: A | Discharge status: Home / Self Care | Payer: No Typology Code available for payment source | Attending: Emergency Medicine | Admitting: Emergency Medicine

## 2020-02-16 ENCOUNTER — Encounter (HOSPITAL_COMMUNITY): Payer: Self-pay | Admitting: *Deleted

## 2020-02-16 DIAGNOSIS — W208XXA Other cause of strike by thrown, projected or falling object, initial encounter: Secondary | ICD-10-CM | POA: Insufficient documentation

## 2020-02-16 DIAGNOSIS — Y9389 Activity, other specified: Secondary | ICD-10-CM | POA: Diagnosis not present

## 2020-02-16 DIAGNOSIS — Z7729 Contact with and (suspected ) exposure to other hazardous substances: Secondary | ICD-10-CM | POA: Insufficient documentation

## 2020-02-16 DIAGNOSIS — R059 Cough, unspecified: Secondary | ICD-10-CM

## 2020-02-16 DIAGNOSIS — Z91040 Latex allergy status: Secondary | ICD-10-CM | POA: Diagnosis not present

## 2020-02-16 DIAGNOSIS — Z21 Asymptomatic human immunodeficiency virus [HIV] infection status: Secondary | ICD-10-CM | POA: Diagnosis not present

## 2020-02-16 DIAGNOSIS — F909 Attention-deficit hyperactivity disorder, unspecified type: Secondary | ICD-10-CM | POA: Insufficient documentation

## 2020-02-16 DIAGNOSIS — M25562 Pain in left knee: Secondary | ICD-10-CM | POA: Diagnosis not present

## 2020-02-16 DIAGNOSIS — F17210 Nicotine dependence, cigarettes, uncomplicated: Secondary | ICD-10-CM | POA: Diagnosis not present

## 2020-02-16 DIAGNOSIS — Y999 Unspecified external cause status: Secondary | ICD-10-CM | POA: Insufficient documentation

## 2020-02-16 DIAGNOSIS — Z79899 Other long term (current) drug therapy: Secondary | ICD-10-CM | POA: Diagnosis not present

## 2020-02-16 DIAGNOSIS — R05 Cough: Secondary | ICD-10-CM | POA: Diagnosis not present

## 2020-02-16 DIAGNOSIS — Y92511 Restaurant or cafe as the place of occurrence of the external cause: Secondary | ICD-10-CM | POA: Insufficient documentation

## 2020-02-16 NOTE — Discharge Instructions (Addendum)
The chest x-ray showed no acute abnormalities. The key with inhalations is routine follow-up with a primary care provider.  Further evaluation should be scheduled and performed by this person.  Mucinex may be considered.  Plain Mucinex is an expectorant and can be taken for the next several days. Be sure to drink plenty of water to stay well hydrated.   Return to the emergency department for shortness of breath, chest pain, passing out, dizziness, difficulty swallowing, or any other major concerns.
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
## 2020-06-08 IMAGING — CR PORTABLE CHEST - 1 VIEW
1 series · 1 of 1 positions shown · non-contrast
Comparison: None.

CLINICAL DATA: Shortness of breath and cough for 1 week, fevers

EXAM:
PORTABLE CHEST 1 VIEW

[ap]
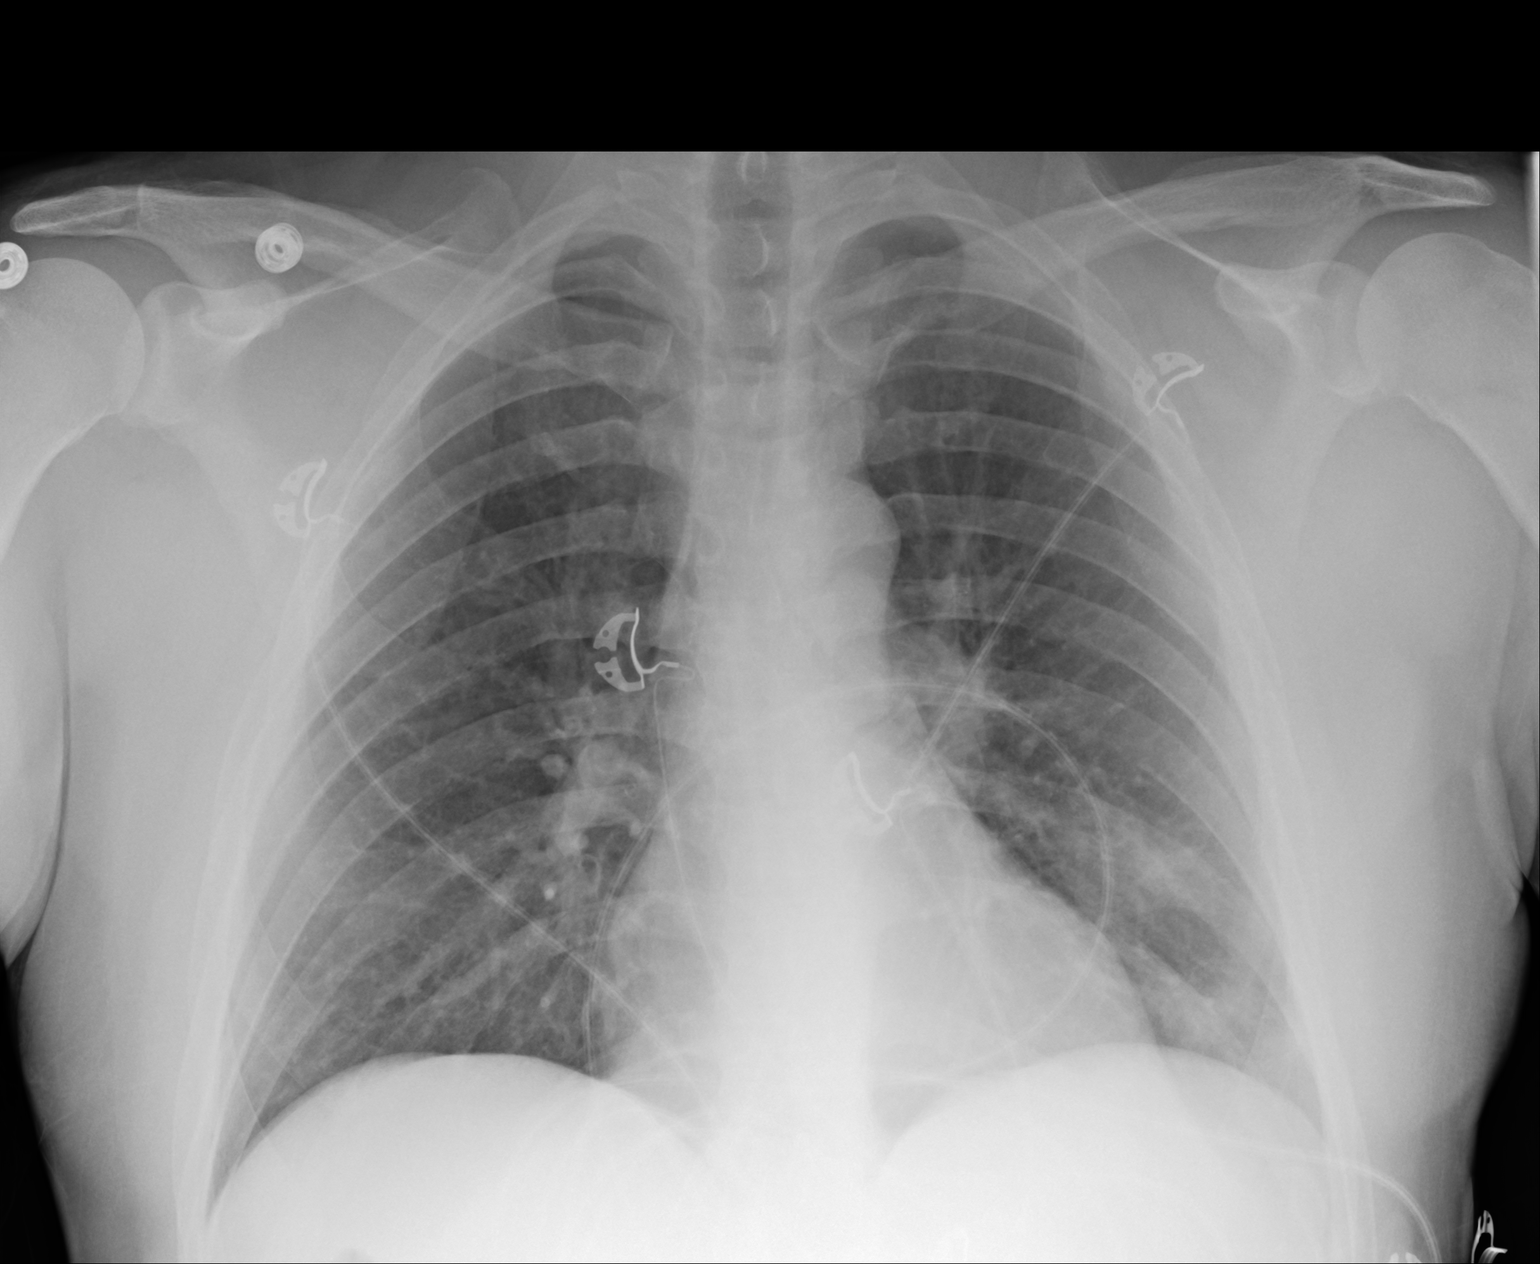

[1 of 1 positions shown; findings below may reference images not displayed]

FINDINGS: Cardiac shadows within normal limits. The lungs are well aerated
bilaterally. Patchy infiltrate is noted in the left lung base. No
sizable effusion is seen. No bony abnormality is noted.
IMPRESSION: Patchy left basilar infiltrate.

## 2020-07-23 ENCOUNTER — Encounter: Payer: Self-pay | Admitting: Neurology

## 2020-09-03 ENCOUNTER — Ambulatory Visit: Payer: No Typology Code available for payment source | Admitting: Neurology

## 2021-01-11 IMAGING — CT CT ANGIO CHEST
2 of 6 series · 18 of 46 positions shown · IV contrast (omnipaque)
Comparison: None.

CLINICAL DATA: Chest pain and shortness of breath

EXAM:
CT ANGIOGRAPHY CHEST WITH CONTRAST
TECHNIQUE: Multidetector CT imaging of the chest was performed using the
standard protocol during bolus administration of intravenous
contrast. Multiplanar CT image reconstructions and MIPs were
obtained to evaluate the vascular anatomy.
CONTRAST:  100mL OMNIPAQUE IOHEXOL 350 MG/ML SOLN

[Series 6: pe axial thins · axial · 0.84mm/px · z∈[+949,+1222]mm · 15 of 300 slices shown]
[im 14/300  lung]
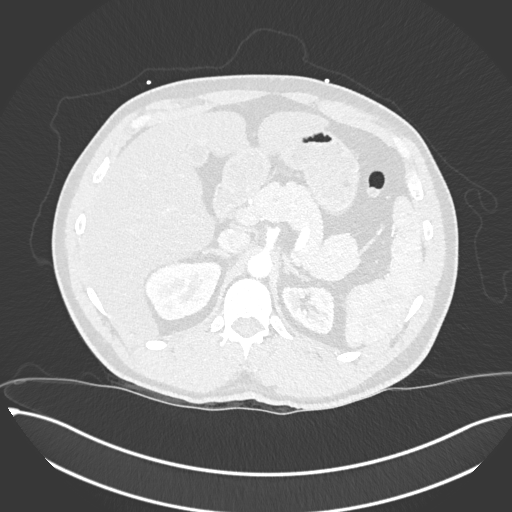
[im 40/300  soft-tissue]
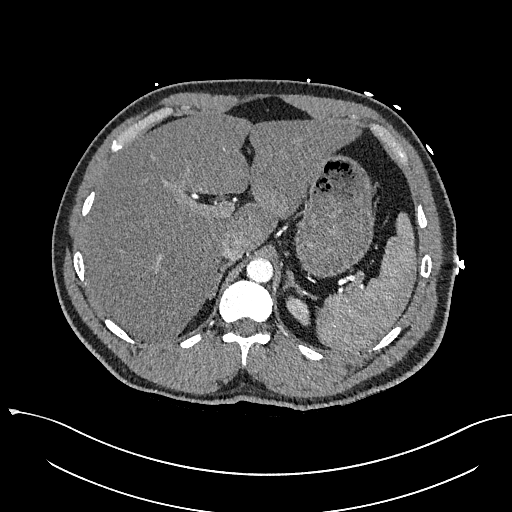
[im 53/300  lung]
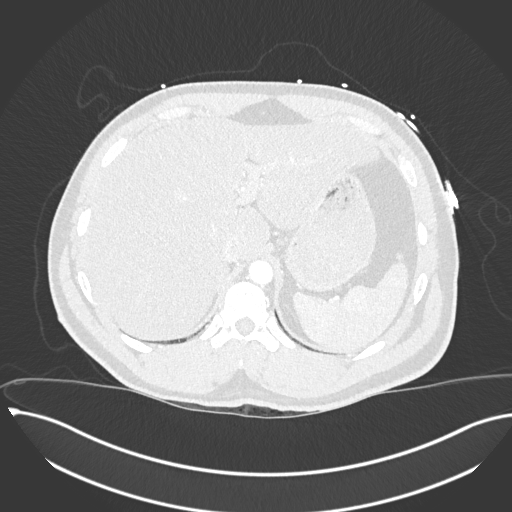
[im 79/300  soft-tissue]
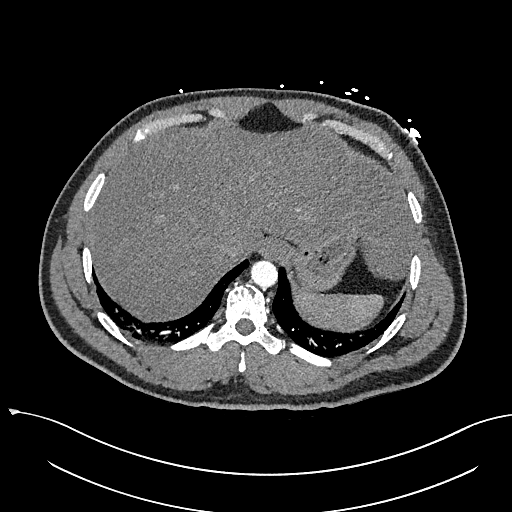
[im 92/300  lung]
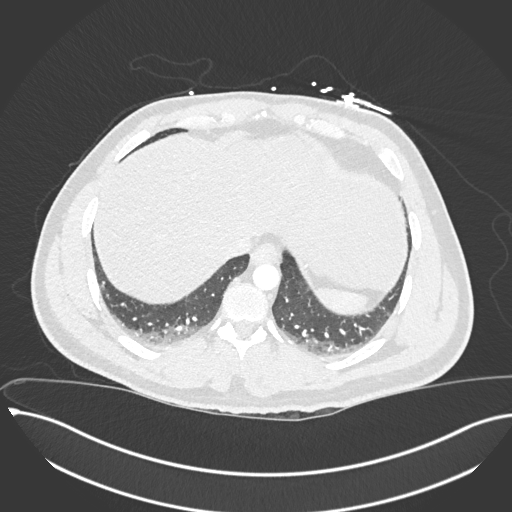
[im 118/300  soft-tissue]
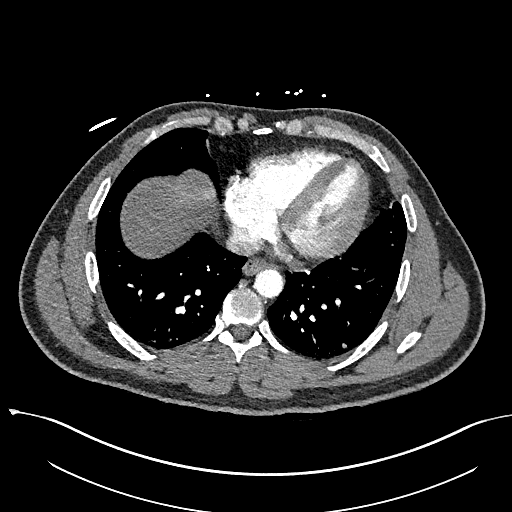
[im 131/300  lung]
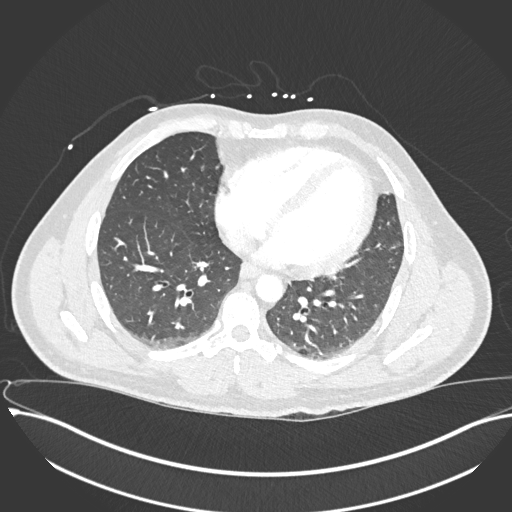
[im 157/300  soft-tissue]
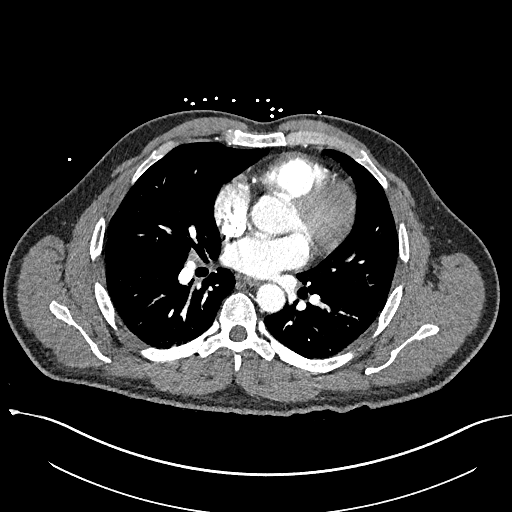
[im 170/300  lung]
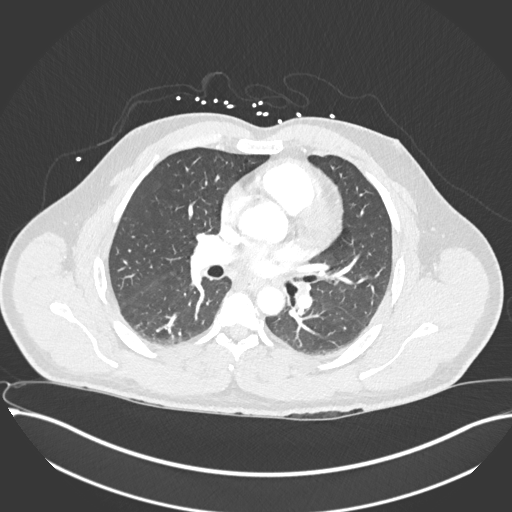
[im 183/300  soft-tissue]
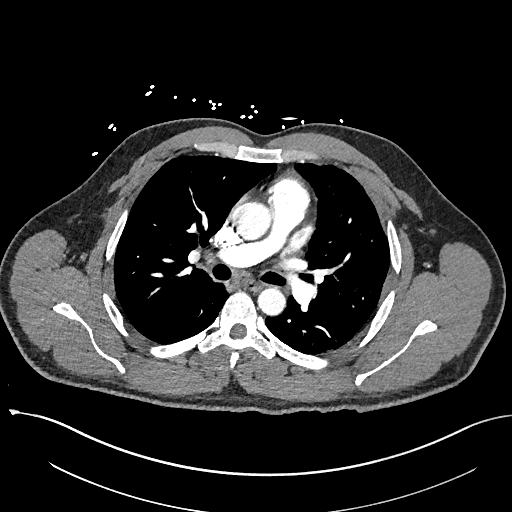
[im 209/300  lung]
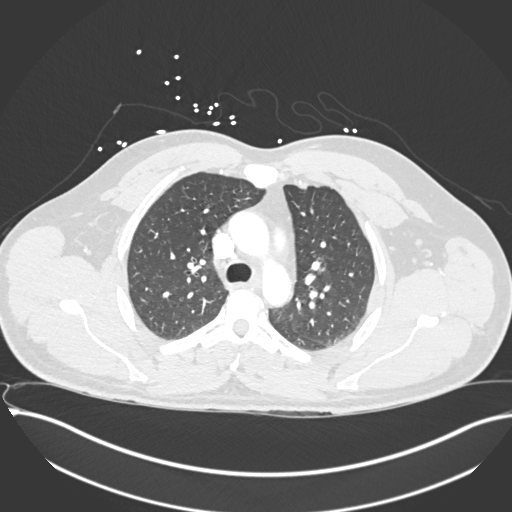
[im 222/300  soft-tissue]
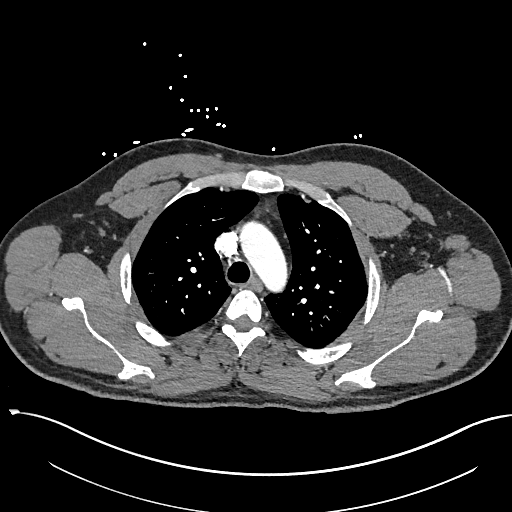
[im 248/300  lung]
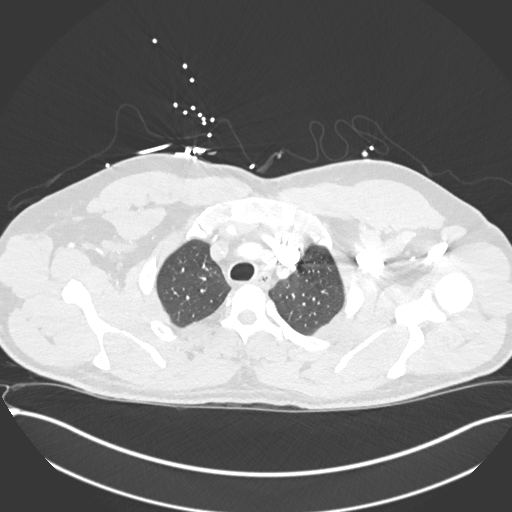
[im 261/300  soft-tissue]
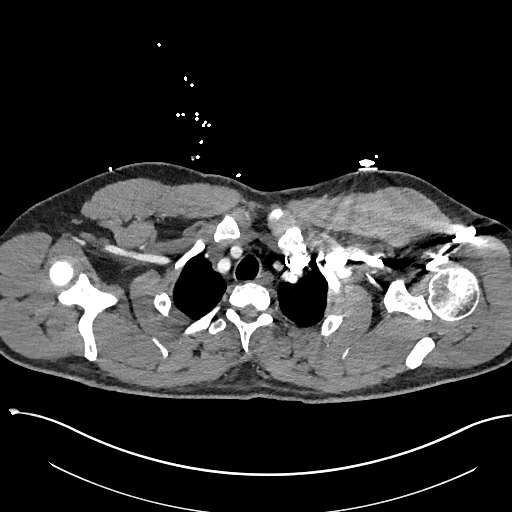
[im 287/300  lung]
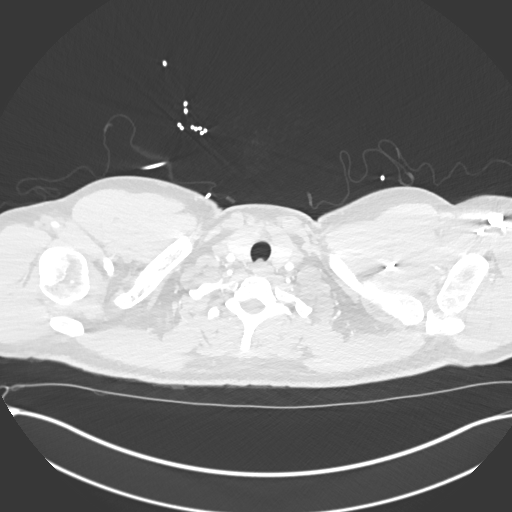

[Series 8: cor soft · coronal · 0.60mm/px · 3 of 151 slices shown]
[im 38/151  soft-tissue]
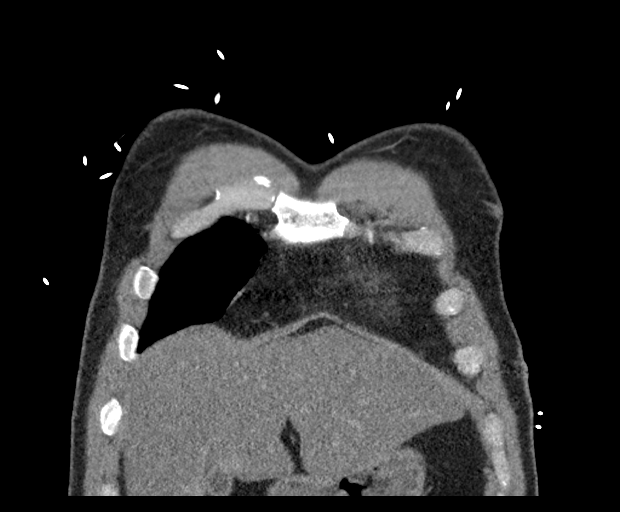
[im 76/151  soft-tissue]
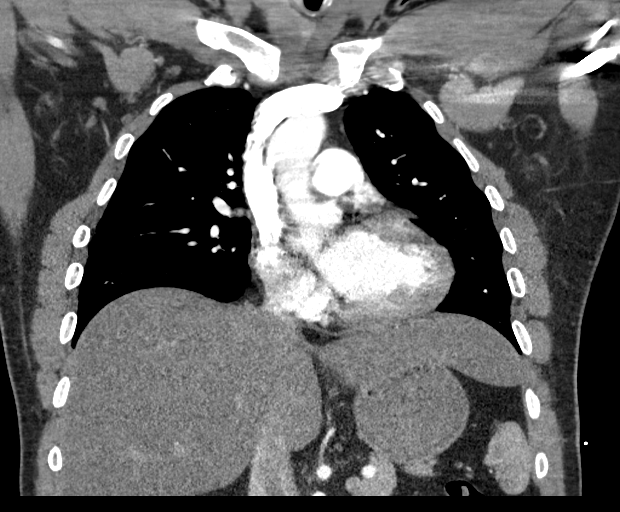
[im 113/151  soft-tissue]
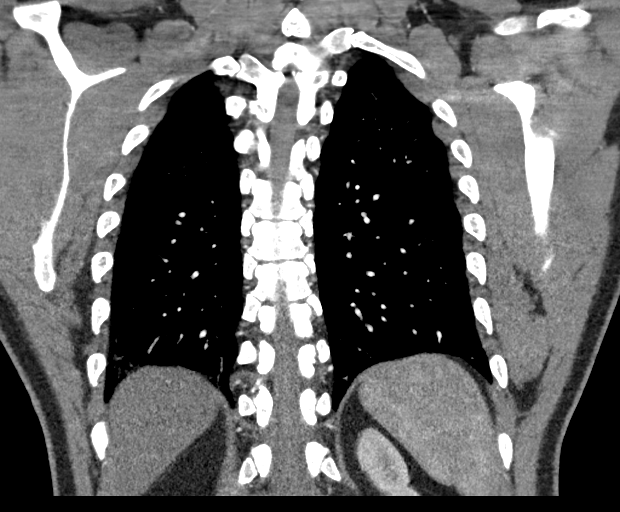

[18 of 46 positions shown; findings below may reference images not displayed]

FINDINGS: Cardiovascular:

--Pulmonary arteries: Contrast injection is sufficient to
demonstrate satisfactory opacification of the pulmonary arteries to
the segmental level, with attenuation of at least 200 HU at the main
pulmonary artery. There is no pulmonary embolus. The main pulmonary
artery is within normal limits for size.

--Aorta: Satisfactory opacification of the thoracic aorta. No aortic
dissection or other acute aortic syndrome. Conventional 3 vessel
aortic branching pattern. There is no aortic atherosclerosis.

--Heart: Normal size. No pericardial effusion.

Mediastinum/Nodes: No mediastinal, hilar or axillary
lymphadenopathy. The visualized thyroid and thoracic esophageal
course are unremarkable.

Lungs/Pleura: No pulmonary nodules or masses. No pleural effusion or
pneumothorax. No focal airspace consolidation. No focal pleural
abnormality.

Upper Abdomen: Contrast bolus timing is not optimized for evaluation
of the abdominal organs. Hepatic steatosis. Left renal atrophy.

Musculoskeletal: No chest wall abnormality. No bony spinal canal
stenosis.

Review of the MIP images confirms the above findings.
IMPRESSION: 1. No pulmonary embolus or acute aortic syndrome.
2. No acute thoracic abnormality.
3. Hepatic steatosis.

## 2021-10-24 ENCOUNTER — Emergency Department (HOSPITAL_COMMUNITY)
Admission: EM | Admit: 2021-10-24 | Discharge: 2021-10-24 | Disposition: A | Discharge status: Home / Self Care | Payer: No Typology Code available for payment source | Attending: Emergency Medicine | Admitting: Emergency Medicine

## 2021-10-24 ENCOUNTER — Encounter (HOSPITAL_COMMUNITY): Payer: Self-pay

## 2021-10-24 ENCOUNTER — Other Ambulatory Visit: Payer: Self-pay

## 2021-10-24 ENCOUNTER — Emergency Department (HOSPITAL_COMMUNITY): Payer: No Typology Code available for payment source

## 2021-10-24 DIAGNOSIS — Z21 Asymptomatic human immunodeficiency virus [HIV] infection status: Secondary | ICD-10-CM | POA: Insufficient documentation

## 2021-10-24 DIAGNOSIS — K625 Hemorrhage of anus and rectum: Secondary | ICD-10-CM | POA: Diagnosis not present

## 2021-10-24 DIAGNOSIS — K6289 Other specified diseases of anus and rectum: Secondary | ICD-10-CM | POA: Diagnosis not present

## 2021-10-24 DIAGNOSIS — Z91040 Latex allergy status: Secondary | ICD-10-CM | POA: Insufficient documentation

## 2021-10-24 DIAGNOSIS — B20 Human immunodeficiency virus [HIV] disease: Secondary | ICD-10-CM

## 2021-10-24 LAB — CBC
HCT: 46.1 % (ref 39.0–52.0)
Hemoglobin: 15.8 g/dL (ref 13.0–17.0)
MCH: 30.3 pg (ref 26.0–34.0)
MCHC: 34.3 g/dL (ref 30.0–36.0)
MCV: 88.3 fL (ref 80.0–100.0)
Platelets: 318 10*3/uL (ref 150–400)
RBC: 5.22 MIL/uL (ref 4.22–5.81)
RDW: 12.3 % (ref 11.5–15.5)
WBC: 8.4 10*3/uL (ref 4.0–10.5)
nRBC: 0 % (ref 0.0–0.2)

## 2021-10-24 LAB — COMPREHENSIVE METABOLIC PANEL
ALT: 29 U/L (ref 0–44)
AST: 18 U/L (ref 15–41)
Albumin: 4.4 g/dL (ref 3.5–5.0)
Alkaline Phosphatase: 63 U/L (ref 38–126)
Anion gap: 9 (ref 5–15)
BUN: 18 mg/dL (ref 6–20)
CO2: 29 mmol/L (ref 22–32)
Calcium: 8.9 mg/dL (ref 8.9–10.3)
Chloride: 97 mmol/L — ABNORMAL LOW (ref 98–111)
Creatinine, Ser: 0.94 mg/dL (ref 0.61–1.24)
GFR, Estimated: >60 mL/min (ref >60)
Glucose, Bld: 219 mg/dL — ABNORMAL HIGH (ref 70–99)
Potassium: 4.5 mmol/L (ref 3.5–5.1)
Sodium: 135 mmol/L (ref 135–145)
Total Bilirubin: 0.3 mg/dL (ref 0.3–1.2)
Total Protein: 7.8 g/dL (ref 6.5–8.1)

## 2021-10-24 LAB — TYPE AND SCREEN
ABO/RH(D): A POS
Antibody Screen: NEGATIVE

## 2021-10-24 MED ORDER — IOHEXOL 350 MG/ML SOLN
80.0000 mL | Freq: Once | INTRAVENOUS | Status: AC | PRN
  Administered 2021-10-24: 80 mL via INTRAVENOUS

## 2021-10-24 NOTE — ED Provider Notes (Signed)
[facility] EMERGENCY DEPARTMENT Provider Note   CSN: [id number] Arrival date & time: 10/24/21  [id number]     History  Chief Complaint  Patient presents with   Rectal Bleeding    Jason Sheppard is a 41 y.o. male.  Patient is a 41 yo male with PMH of HIV presenting for rectal bleeding. Pt states he has hx of anoscopy by infectious disease specialist Dr. Mariea Clonts at [facility] demonstrating high grade dysplasia recent repeat bx presenting for bright red blood per rectum since procedure, 1 week, associated with rectal pressure and sharp pain.  The history is provided by the patient. No language interpreter was used.  Rectal Bleeding Associated symptoms: no abdominal pain, no fever and no vomiting       Home Medications Prior to Admission medications   Medication Sig Start Date End Date Taking? Authorizing Provider  bictegravir-emtricitabine-tenofovir AF (BIKTARVY) 50-200-25 MG TABS tablet Take 1 tablet by mouth every morning.    Yes [provider]  docusate sodium (COLACE) 100 MG capsule Take 100 mg by mouth daily as needed for mild constipation.   Yes [provider]  DULoxetine (CYMBALTA) 60 MG capsule Take 60 mg by mouth daily. 05/24/21  Yes [provider]  lisinopril (ZESTRIL) 5 MG tablet Take 10 mg by mouth daily.    Yes [provider]  montelukast (SINGULAIR) 10 MG tablet Take 1 tablet (10 mg total) by mouth every morning. 02/27/19  Yes Aline August, MD  citalopram (CELEXA) 10 MG tablet Take 20 mg by mouth daily.  Patient not taking: Reported on 10/24/2021    [provider]  nitroGLYCERIN (NITROSTAT) 0.4 MG SL tablet Place 1 tablet (0.4 mg total) under the tongue every 5 (five) minutes as needed for chest pain. 02/27/19   Aline August, MD  pantoprazole (PROTONIX) 40 MG tablet Take 1 tablet (40 mg total) by mouth daily. Patient not taking: Reported on 10/24/2021 02/27/19   Aline August, MD      Allergies    Sulfa antibiotics, Keppra  [levetiracetam], Oxcarbazepine, Iodine, Latex, and Statins    Review of Systems   Review of Systems  Constitutional:  Negative for chills and fever.  HENT:  Negative for ear pain and sore throat.   Eyes:  Negative for pain and visual disturbance.  Respiratory:  Negative for cough and shortness of breath.   Cardiovascular:  Negative for chest pain and palpitations.  Gastrointestinal:  Positive for hematochezia and rectal pain. Negative for abdominal pain and vomiting.       Rectal bleeding   Genitourinary:  Negative for dysuria and hematuria.  Musculoskeletal:  Negative for arthralgias and back pain.  Skin:  Negative for color change and rash.  Neurological:  Negative for seizures and syncope.  All other systems reviewed and are negative.  Physical Exam Updated Vital Signs BP 132/87    Pulse 97    Temp 98.1 F (36.7 C) (Oral)    Resp 18    Ht 5\' 9"  (1.753 m)    Wt 85.3 kg    SpO2 100%    BMI 27.77 kg/m  Physical Exam Vitals and nursing note reviewed.  Constitutional:      General: He is not in acute distress.    Appearance: He is well-developed.  HENT:     Head: Normocephalic and atraumatic.  Eyes:     Conjunctiva/sclera: Conjunctivae normal.  Cardiovascular:     Rate and Rhythm: Normal rate and regular rhythm.     Pulses:  Dorsalis pedis pulses are 2+ on the right side and 2+ on the left side.     Heart sounds: No murmur heard. Pulmonary:     Effort: Pulmonary effort is normal. No respiratory distress.     Breath sounds: Normal breath sounds.  Abdominal:     Palpations: Abdomen is soft.     Tenderness: There is no abdominal tenderness.  Genitourinary:    Comments: Declined GU exam  Musculoskeletal:        General: No swelling.     Cervical back: Neck supple.     Right lower leg: No edema.     Left lower leg: No edema.  Skin:    General: Skin is warm and dry.     Capillary Refill: Capillary refill takes less than 2 seconds.  Neurological:     Mental  Status: He is alert.  Psychiatric:        Mood and Affect: Mood normal.    ED Results / Procedures / Treatments   Labs (all labs ordered are listed, but only abnormal results are displayed) Labs Reviewed  COMPREHENSIVE METABOLIC PANEL - Abnormal; Notable for the following components:      Result Value   Chloride 97 (*)    Glucose, Bld 219 (*)    All other components within normal limits  CBC  POC OCCULT BLOOD, ED  TYPE AND SCREEN  ABO/RH    EKG None  Radiology No results found.  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 No signs/symtpoms of perforation. Abdomen soft and non tender. Vitals and hemoglobin stable. Complaints of rectal pressure with no prior imaging. Declines rectal exam.  Rec transfer to [facility] for Ct abdomen/pelvis and return to [facility]. Our CT scanner currently down. Pt accepted by [facility] doc.   Ct abdomen demonstrates no acute process to explain rectal pressure.   Patient in no distress and overall condition improved here in the ED. Detailed discussions were had with the patient regarding current findings, and need for close f/u with oncology. The patient has been instructed to return immediately if the symptoms worsen in any way for re-evaluation. Patient verbalized understanding and is in agreement with current care plan. All questions answered prior to discharge.         Final Clinical Impression(s) / ED Diagnoses Final diagnoses:  HIV infection, unspecified symptom status ([place])  Rectal bleeding    Rx / DC Orders ED Discharge Orders     None         Lianne Cure, DO [phone]

## 2021-10-24 NOTE — ED Triage Notes (Signed)
Patient reports rectal bleeding for the past week. States that he had procedure last week and in past bleeding stopped within two days. States that it was heavy last night and pinkish in color.

## 2021-10-24 NOTE — ED Notes (Signed)
[facility] at bedside at this time.  

## 2021-10-26 ENCOUNTER — Ambulatory Visit (HOSPITAL_COMMUNITY): Payer: No Typology Code available for payment source | Admitting: Hematology

## 2021-10-26 NOTE — Progress Notes (Addendum)
I reviewed records from [facility] with Dr Mosetta Putt.  The only pathology report available is dated 10/21/2021 and states "keratinizing squamous epithelium with hyperkeratosis.  Negative for dysplasia."  Per Dr Mosetta Putt this patient does not have cancer and he does not need a consult with oncology.  I left a message for the Oncology navigator at the [facility], Jason Sheppard, relating this information.  I spoke with Jason Sheppard and explained there is no documentation available to [place] that shows Jason Sheppard has cancer.  She states they were told this is "precancer".  I told her we do not treat precancer.  I suggested they reach out to a gastroenterologist or colorectal surgeon.  She verbalized understanding.

## 2021-11-16 ENCOUNTER — Ambulatory Visit (INDEPENDENT_AMBULATORY_CARE_PROVIDER_SITE_OTHER): Payer: No Typology Code available for payment source

## 2021-11-16 ENCOUNTER — Other Ambulatory Visit: Payer: Self-pay

## 2021-11-16 ENCOUNTER — Ambulatory Visit
Admission: EM | Admit: 2021-11-16 | Discharge: 2021-11-16 | Disposition: A | Discharge status: Home / Self Care | Payer: No Typology Code available for payment source | Attending: Family Medicine | Admitting: Family Medicine

## 2021-11-16 DIAGNOSIS — R509 Fever, unspecified: Secondary | ICD-10-CM

## 2021-11-16 DIAGNOSIS — J069 Acute upper respiratory infection, unspecified: Secondary | ICD-10-CM | POA: Diagnosis not present

## 2021-11-16 DIAGNOSIS — R062 Wheezing: Secondary | ICD-10-CM | POA: Diagnosis not present

## 2021-11-16 DIAGNOSIS — R059 Cough, unspecified: Secondary | ICD-10-CM | POA: Diagnosis not present

## 2021-11-16 MED ORDER — PROMETHAZINE-DM 6.25-15 MG/5ML PO SYRP
5.0000 mL | ORAL_SOLUTION | Freq: Four times a day (QID) | ORAL | 0 refills | Status: DC | PRN
Start: 2021-11-16 — End: 2022-07-11

## 2021-11-16 MED ORDER — PREDNISONE 20 MG PO TABS: 40.0000 mg | ORAL_TABLET | Freq: Every day | ORAL | 0 refills | Status: DC

## 2021-11-16 NOTE — ED Triage Notes (Signed)
Pt reports sinus pressure, cough, pain under right breast when coughing and watery eyes x 3 days.

## 2021-11-19 NOTE — ED Provider Notes (Signed)
[place]   [id number] 11/16/21 Arrival Time: [id number]  ASSESSMENT & PLAN:  1. Viral URI with cough   2. Wheezing    I have personally viewed the imaging studies ordered this visit. No signs of PNA.  Reassured.  Discussed typical duration of viral illnesses. Viral testing declined. OTC symptom care as needed.  Discharge Medication List as of 11/16/2021  6:29 PM     START taking these medications   Details  predniSONE (DELTASONE) 20 MG tablet Take 2 tablets (40 mg total) by mouth daily., Starting Wed 11/16/2021, Normal    promethazine-dextromethorphan (PROMETHAZINE-DM) 6.25-15 MG/5ML syrup Take 5 mLs by mouth 4 (four) times daily as needed for cough., Starting Wed 11/16/2021, Normal         Follow-up [facility].   Why: As needed. Contact information: [place] [phone] [phone]                 Reviewed expectations re: course of current medical issues. Questions answered. Outlined signs and symptoms indicating need for more acute intervention. Understanding verbalized. After Visit Summary given.   SUBJECTIVE: History from: patient. Jason Sheppard is a 41 y.o. male who reports: nasal congestion/pressure, cough; x sev days; child with the same. Denies: difficulty breathing. Normal PO intake without n/v/d.  Social History   Tobacco Use  Smoking Status Every Day   Packs/day: 1.00   Years: 14.00   Pack years: 14.00   Types: Cigarettes   Last attempt to quit: 10/23/2005   Years since quitting: 16.0  Smokeless Tobacco Never    OBJECTIVE:  Vitals:   11/16/21 1755  BP: 131/88  Pulse: 65  Resp: 18  Temp: 98.6 F (37 C)  TempSrc: Oral  SpO2: 98%    General appearance: alert; no distress Eyes: PERRLA; EOMI; conjunctiva normal HENT: [place]; AT; with nasal congestion Neck: supple  Lungs: speaks full sentences without difficulty; unlabored; fine exp wheezes  bilaterally Extremities: no edema Skin: warm and dry Neurologic: normal gait Psychological: alert and cooperative; normal mood and affect  Imaging: DG Chest 2 View  Result Date: 11/16/2021 CLINICAL DATA:  Cough.  Subjective fever EXAM: CHEST - 2 VIEW COMPARISON:  02/16/2020 FINDINGS: The heart size and mediastinal contours are within normal limits. Both lungs are clear. The visualized skeletal structures are unremarkable. IMPRESSION: No active cardiopulmonary disease. Electronically Signed   By: Franchot Gallo M.D.   On: 11/16/2021 18:22    Allergies  Allergen Reactions   Sulfa Antibiotics Anaphylaxis   Atorvastatin Diarrhea    Other reaction(s): Suicidal thoughts   Keppra [Levetiracetam] Other (See Comments)    "rage"   Oxcarbazepine Other (See Comments)    Other reaction(s): Liver enzymes abnormal   Iodine Rash   Latex Rash   Statins Anxiety    Past Medical History:  Diagnosis Date   Allergy    Attention deficit disorder with hyperactivity    Depression    HIV (human immunodeficiency virus infection) (HCC)    Infective pericarditis    Migraine    OSA (obstructive sleep apnea)    Plantar fascial fibromatosis    PTSD (post-traumatic stress disorder)    Seizure disorder ([place])    Social History   Socioeconomic History   Marital status: Married    Spouse name: Not on file   Number of children: Not on file   Years of education: Not on file   Highest education level: Not on file  Occupational History    Comment[place]  Tobacco Use   Smoking status: Every Day    Packs/day: 1.00    Years: 14.00    Pack years: 14.00    Types: Cigarettes    Last attempt to quit: 10/23/2005    Years since quitting: 16.0   Smokeless tobacco: Never  Vaping Use   Vaping Use: Never used  Substance and Sexual Activity   Alcohol use: No    Alcohol/week: 0.0 standard drinks   Drug use: No   Sexual activity: Yes    Partners: Female  Other Topics Concern   Not on file   Social History Narrative   Drinks 48 oz coffee daily.   Social Determinants of Health   Financial Resource Strain: Not on file  Food Insecurity: Not on file  Transportation Needs: Not on file  Physical Activity: Not on file  Stress: Not on file  Social Connections: Not on file  Intimate Partner Violence: Not on file   Family History  Problem Relation Age of Onset   Diabetes Mother    Heart disease Mother    Hypertension Mother    Diabetes Father    Heart disease Father    Hyperlipidemia Father    Past Surgical History:  Procedure Laterality Date   HAND SURGERY     shrapnel removal       Vanessa Kick, MD 11/19/21 1116

## 2022-02-13 ENCOUNTER — Other Ambulatory Visit: Payer: Self-pay

## 2022-02-13 ENCOUNTER — Encounter (HOSPITAL_COMMUNITY): Payer: Self-pay

## 2022-02-13 ENCOUNTER — Emergency Department (HOSPITAL_COMMUNITY)
Admission: EM | Admit: 2022-02-13 | Discharge: 2022-02-13 | Disposition: A | Discharge status: Home / Self Care | Payer: No Typology Code available for payment source | Attending: Emergency Medicine | Admitting: Emergency Medicine

## 2022-02-13 DIAGNOSIS — X509XXA Other and unspecified overexertion or strenuous movements or postures, initial encounter: Secondary | ICD-10-CM | POA: Insufficient documentation

## 2022-02-13 DIAGNOSIS — Z21 Asymptomatic human immunodeficiency virus [HIV] infection status: Secondary | ICD-10-CM | POA: Diagnosis not present

## 2022-02-13 DIAGNOSIS — M545 Low back pain, unspecified: Secondary | ICD-10-CM | POA: Diagnosis present

## 2022-02-13 DIAGNOSIS — Y92007 Garden or yard of unspecified non-institutional (private) residence as the place of occurrence of the external cause: Secondary | ICD-10-CM | POA: Diagnosis not present

## 2022-02-13 DIAGNOSIS — F17210 Nicotine dependence, cigarettes, uncomplicated: Secondary | ICD-10-CM | POA: Diagnosis not present

## 2022-02-13 MED ORDER — METHOCARBAMOL 500 MG PO TABS: 500.0000 mg | ORAL_TABLET | Freq: Three times a day (TID) | ORAL | 0 refills | Status: DC | PRN

## 2022-02-13 MED ORDER — NAPROXEN 500 MG PO TABS: 500.0000 mg | ORAL_TABLET | Freq: Two times a day (BID) | ORAL | 0 refills | Status: AC

## 2022-02-13 MED ORDER — LIDOCAINE 5 % EX PTCH: 1.0000 | MEDICATED_PATCH | CUTANEOUS | 0 refills | Status: DC

## 2022-02-13 NOTE — ED Provider Notes (Signed)
?[facility]-EMERGENCY DEPT ?[facility] Emergency Department ?Provider Note ?MRN:  [id number]  ?Arrival date & time: 02/13/22    ? ?Chief Complaint   ?Back Pain ?  ?History of Present Illness   ?Jason Sheppard is a 41 y.o. year-old male with a history of HIV presenting to the ED with chief complaint of back pain. ? ?Left lumbar back pain for the past week.  Thinks he pulled something while working in the yard.  No fall, no trauma, no car accidents.  No numbness or weakness to the arms or legs, no bowel or bladder dysfunction.  No fever.  Worse with motion. ? ?Review of Systems  ?A thorough review of systems was obtained and all systems are negative except as noted in the HPI and PMH.  ? ?Patient's Health History   ? ?Past Medical History:  ?Diagnosis Date  ? Allergy   ? Attention deficit disorder with hyperactivity   ? Depression   ? HIV (human immunodeficiency virus infection) (HCC)   ? Infective pericarditis   ? Migraine   ? OSA (obstructive sleep apnea)   ? Plantar fascial fibromatosis   ? PTSD (post-traumatic stress disorder)   ? Seizure disorder (HCC)   ?  ?Past Surgical History:  ?Procedure Laterality Date  ? HAND SURGERY    ? shrapnel removal    ?  ?Family History  ?Problem Relation Age of Onset  ? Diabetes Mother   ? Heart disease Mother   ? Hypertension Mother   ? Diabetes Father   ? Heart disease Father   ? Hyperlipidemia Father   ?  ?Social History  ? ?Socioeconomic History  ? Marital status: Married  ?  Spouse name: Not on file  ? Number of children: Not on file  ? Years of education: Not on file  ? Highest education level: Not on file  ?Occupational History  ?  Comment[place]  ?Tobacco Use  ? Smoking status: Every Day  ?  Packs/day: 1.00  ?  Years: 14.00  ?  Pack years: 14.00  ?  Types: Cigarettes  ?  Last attempt to quit: 10/23/2005  ?  Years since quitting: 16.3  ? Smokeless tobacco: Never  ?Vaping Use  ? Vaping Use: Never used  ?Substance and Sexual Activity  ? Alcohol use: No  ?   Alcohol/week: 0.0 standard drinks  ? Drug use: No  ? Sexual activity: Yes  ?  Partners: Female  ?Other Topics Concern  ? Not on file  ?Social History Narrative  ? Drinks 48 oz coffee daily.  ? ?Social Determinants of Health  ? ?Financial Resource Strain: Not on file  ?Food Insecurity: Not on file  ?Transportation Needs: Not on file  ?Physical Activity: Not on file  ?Stress: Not on file  ?Social Connections: Not on file  ?Intimate Partner Violence: Not on file  ?  ? ?Physical Exam  ? ?Vitals:  ? 02/13/22 0143  ?BP: (!) 137/105  ?Pulse: 74  ?Resp: 20  ?Temp: 97.6 ?F (36.4 ?C)  ?SpO2: 100%  ?  ?CONSTITUTIONAL: Well-appearing, NAD ?NEURO/PSYCH:  Alert and oriented x 3, no focal deficits ?EYES:  eyes equal and reactive ?ENT/NECK:  no LAD, no JVD ?CARDIO: Regular rate, well-perfused, normal S1 and S2 ?PULM:  CTAB no wheezing or rhonchi ?GI/GU:  non-distended, non-tender ?MSK/SPINE:  No gross deformities, no edema; tender to the left lumbar paraspinal musculature ?SKIN:  no rash, atraumatic ? ? ?*Additional and/or pertinent findings included in MDM below ? ?  Diagnostic and Interventional Summary  ? ? EKG Interpretation ? ?Date/Time:    ?Ventricular Rate:    ?PR Interval:    ?QRS Duration:   ?QT Interval:    ?QTC Calculation:   ?R Axis:     ?Text Interpretation:   ?  ? ?  ? ?Labs Reviewed - No data to display  ?No orders to display  ?  ?Medications - No data to display  ? ?Procedures  /  Critical Care ?Procedures ? ?ED Course and Medical Decision Making  ?Initial Impression and Ddx ?History and physical exam consistent with MSK back pain.  No radicular component, no symptoms or signs of myelopathy.  No indication for imaging given lack of trauma, no fever, no signs of infectious etiology.  Patient is appropriate for discharge with reassurance. ? ?Past medical/surgical history that increases complexity of ED encounter: HIV ? ?Interpretation of Diagnostics ?Not applicable ? ?Patient Reassessment and Ultimate  Disposition/Management ?Discharge home ? ?Patient management required discussion with the following services or consulting groups:  None ? ?Complexity of Problems Addressed ?Acute complicated illness or Injury ? ?Additional Data Reviewed and Analyzed ?Further history obtained from: ?None ? ?Additional Factors Impacting ED Encounter Risk ?Prescriptions ? ?Elmer Sow. Pilar Plate, MD ?[facility] Emergency Medicine ?[facility] [email or web address] ? ?Final Clinical Impressions(s) / ED Diagnoses  ? ?  ICD-10-CM   ?1. Acute left-sided low back pain without sciatica  M54.50   ?  ?  ?ED Discharge Orders   ? ?      Ordered  ?  naproxen (NAPROSYN) 500 MG tablet  2 times daily       ? 02/13/22 0214  ?  methocarbamol (ROBAXIN) 500 MG tablet  Every 8 hours PRN       ? 02/13/22 0214  ?  lidocaine (LIDODERM) 5 %  Every 24 hours       ? 02/13/22 0214  ? ?  ?  ? ?  ?  ? ?Discharge Instructions Discussed with and Provided to Patient:  ? ? ?Discharge Instructions   ? ?  ?You were evaluated in the Emergency Department and after careful evaluation, we did not find any emergent condition requiring admission or further testing in the hospital. ? ?Your exam/testing today is overall reassuring.  Symptoms likely due to a muscle sprain or spasm.  Take the Naprosyn twice daily as directed.  Can use the Robaxin muscle relaxer for more significant pain that is keeping you from sleeping at night, use caution as this medication can use drowsiness.  During the day you can also use the Lidoderm numbing patches. ? ?Please return to the Emergency Department if you experience any worsening of your condition.   Thank you for allowing [place] to be a part of your care. ? ? ? ?  ?Sabas Sous, MD ?02/13/22 0216 ? ?

## 2022-02-13 NOTE — Discharge Instructions (Signed)
You were evaluated in the Emergency Department and after careful evaluation, we did not find any emergent condition requiring admission or further testing in the hospital. ? ?Your exam/testing today is overall reassuring.  Symptoms likely due to a muscle sprain or spasm.  Take the Naprosyn twice daily as directed.  Can use the Robaxin muscle relaxer for more significant pain that is keeping you from sleeping at night, use caution as this medication can use drowsiness.  During the day you can also use the Lidoderm numbing patches. ? ?Please return to the Emergency Department if you experience any worsening of your condition.   Thank you for allowing [place] to be a part of your care. ?

## 2022-02-13 NOTE — ED Triage Notes (Addendum)
Left lumbar pain for about a week. Says it hurts to the touch as well as with movement. Thought he might have pulled it while working but it hasnt gotten any better.  ?Denies any urinary s/s  ?

## 2022-04-06 ENCOUNTER — Ambulatory Visit
Admission: EM | Admit: 2022-04-06 | Discharge: 2022-04-06 | Disposition: A | Discharge status: Home / Self Care | Payer: No Typology Code available for payment source | Attending: Nurse Practitioner | Admitting: Nurse Practitioner

## 2022-04-06 ENCOUNTER — Encounter: Payer: Self-pay | Admitting: Emergency Medicine

## 2022-04-06 DIAGNOSIS — J069 Acute upper respiratory infection, unspecified: Secondary | ICD-10-CM

## 2022-04-06 MED ORDER — FLUTICASONE PROPIONATE 50 MCG/ACT NA SUSP: 2.0000 | Freq: Every day | NASAL | 0 refills | Status: AC

## 2022-04-06 MED ORDER — PSEUDOEPH-BROMPHEN-DM 30-2-10 MG/5ML PO SYRP: 5.0000 mL | ORAL_SOLUTION | Freq: Four times a day (QID) | ORAL | 0 refills | Status: DC | PRN

## 2022-04-06 MED ORDER — CETIRIZINE-PSEUDOEPHEDRINE ER 5-120 MG PO TB12
1.0000 | ORAL_TABLET | Freq: Two times a day (BID) | ORAL | 0 refills | Status: DC
Start: 2022-04-06 — End: 2023-02-16

## 2022-04-06 NOTE — ED Triage Notes (Signed)
Cough since yesterday with sinus pressure and green nasal congestion.  Left ear pressure.

## 2022-04-06 NOTE — ED Provider Notes (Signed)
[facility]    CSN: [id number] Arrival date & time: 04/06/22  1713      History   Chief Complaint No chief complaint on file.   HPI Jason Sheppard is a 41 y.o. male.   The history is provided by the patient.   Patient presents with upper respiratory symptoms that started 1 day ago.  Symptoms include cough, sinus pressure, nasal congestion, and left ear pressure.  Patient states he also had a fever of 102 last evening.  He denies chills, ear drainage, wheezing, shortness of breath, or GI symptoms.  Patient states he took Mucinex DM for his symptoms.  Denies any known sick contacts at this time.  Past Medical History:  Diagnosis Date   Allergy    Attention deficit disorder with hyperactivity    Depression    HIV (human immunodeficiency virus infection) (HCC)    Infective pericarditis    Migraine    OSA (obstructive sleep apnea)    Plantar fascial fibromatosis    PTSD (post-traumatic stress disorder)    Seizure disorder [facility])     Patient Active Problem List   Diagnosis Date Noted   Pericarditis 03/24/2019   CAP (community acquired pneumonia) 02/25/2019   Unintentional weight loss 06/06/2016   HIV disease (HCC) 12/15/2015   Dyslipidemia 12/15/2015   Hyperglycemia 12/15/2015   Sleep apnea 12/15/2015   Former smoker 12/15/2015   Attention deficit disorder with hyperactivity 12/15/2015   PTSD (post-traumatic stress disorder) 12/15/2015    Past Surgical History:  Procedure Laterality Date   HAND SURGERY     shrapnel removal         Home Medications    Prior to Admission medications   Medication Sig Start Date End Date Taking? Authorizing Provider  brompheniramine-pseudoephedrine-DM 30-2-10 MG/5ML syrup Take 5 mLs by mouth 4 (four) times daily as needed. 04/06/22  Yes Shandra Szymborski-Warren, Sadie Haber, NP  cetirizine-pseudoephedrine (ZYRTEC-D) 5-120 MG tablet Take 1 tablet by mouth 2 (two) times daily. 04/06/22  Yes Judge Duque-Warren, Sadie Haber, NP  fluticasone  (FLONASE) 50 MCG/ACT nasal spray Place 2 sprays into both nostrils daily. 04/06/22  Yes Margaurite Salido-Warren, Sadie Haber, NP  bictegravir-emtricitabine-tenofovir AF (BIKTARVY) 50-200-25 MG TABS tablet Take 1 tablet by mouth every morning.     [provider]  DULoxetine (CYMBALTA) 60 MG capsule Take 60 mg by mouth daily. 05/24/21   [provider]  lidocaine (LIDODERM) 5 % Place 1 patch onto the skin daily. Remove & Discard patch within 12 hours or as directed by MD 02/13/22   Sabas Sous, MD  lisinopril (ZESTRIL) 5 MG tablet Take 10 mg by mouth daily.     [provider]  methocarbamol (ROBAXIN) 500 MG tablet Take 1 tablet (500 mg total) by mouth every 8 (eight) hours as needed for muscle spasms. 02/13/22   Sabas Sous, MD  montelukast (SINGULAIR) 10 MG tablet Take 1 tablet (10 mg total) by mouth every morning. 02/27/19   Glade Lloyd, MD  predniSONE (DELTASONE) 20 MG tablet Take 2 tablets (40 mg total) by mouth daily. 11/16/21   Mardella Layman, MD  promethazine-dextromethorphan (PROMETHAZINE-DM) 6.25-15 MG/5ML syrup Take 5 mLs by mouth 4 (four) times daily as needed for cough. 11/16/21   Mardella Layman, MD    Family History Family History  Problem Relation Age of Onset   Diabetes Mother    Heart disease Mother    Hypertension Mother    Diabetes Father    Heart disease Father    Hyperlipidemia  Father     Social History Social History   Tobacco Use   Smoking status: Every Day    Packs/day: 1.00    Years: 14.00    Total pack years: 14.00    Types: Cigarettes    Last attempt to quit: 10/23/2005    Years since quitting: 16.4   Smokeless tobacco: Never  Vaping Use   Vaping Use: Never used  Substance Use Topics   Alcohol use: No    Alcohol/week: 0.0 standard drinks of alcohol   Drug use: No     Allergies   Sulfa antibiotics, Atorvastatin, Keppra [levetiracetam], Oxcarbazepine, Iodine, Latex, and Statins   Review of Systems Review of Systems Per  HPI  Physical Exam Triage Vital Signs ED Triage Vitals  Enc Vitals Group     BP 04/06/22 1718 126/79     Pulse Rate 04/06/22 1718 83     Resp 04/06/22 1718 18     Temp 04/06/22 1718 98.2 F (36.8 C)     Temp Source 04/06/22 1718 Oral     SpO2 04/06/22 1718 97 %     Weight --      Height --      Head Circumference --      Peak Flow --      Pain Score 04/06/22 1719 8     Pain Loc --      Pain Edu? --      Excl. in [place]? --    No data found.  Updated Vital Signs BP 126/79 (BP Location: Right Arm)   Pulse 83   Temp 98.2 F (36.8 C) (Oral)   Resp 18   SpO2 97%   Visual Acuity Right Eye Distance:   Left Eye Distance:   Bilateral Distance:    Right Eye Near:   Left Eye Near:    Bilateral Near:     Physical Exam Constitutional:      General: He is not in acute distress.    Appearance: Normal appearance. He is well-developed.  HENT:     Head: Normocephalic and atraumatic.     Right Ear: Tympanic membrane, ear canal and external ear normal.     Left Ear: Tympanic membrane, ear canal and external ear normal.     Nose: Congestion present.     Right Turbinates: Enlarged and swollen.     Left Turbinates: Enlarged and swollen.     Right Sinus: Maxillary sinus tenderness and frontal sinus tenderness present.     Left Sinus: Maxillary sinus tenderness and frontal sinus tenderness present.     Mouth/Throat:     Lips: Pink.     Mouth: Mucous membranes are moist.     Pharynx: Posterior oropharyngeal erythema present. No oropharyngeal exudate or uvula swelling.     Tonsils: 0 on the right. 0 on the left.  Eyes:     Conjunctiva/sclera: Conjunctivae normal.     Pupils: Pupils are equal, round, and reactive to light.  Neck:     Thyroid: No thyromegaly.     Trachea: No tracheal deviation.  Cardiovascular:     Rate and Rhythm: Normal rate and regular rhythm.     Pulses: Normal pulses.     Heart sounds: Normal heart sounds.  Pulmonary:     Effort: Pulmonary effort is normal.      Breath sounds: Normal breath sounds.  Abdominal:     General: Bowel sounds are normal. There is no distension.     Palpations: Abdomen is soft.  Tenderness: There is no abdominal tenderness.  Musculoskeletal:     Cervical back: Normal range of motion and neck supple.  Lymphadenopathy:     Cervical: No cervical adenopathy.  Skin:    General: Skin is warm and dry.  Neurological:     General: No focal deficit present.     Mental Status: He is alert and oriented to person, place, and time.  Psychiatric:        Behavior: Behavior normal.        Thought Content: Thought content normal.        Judgment: Judgment normal.      UC Treatments / Results  Labs (all labs ordered are listed, but only abnormal results are displayed) Labs Reviewed  COVID-19, FLU A+B NAA    EKG   Radiology No results found.  Procedures Procedures (including critical care time)  Medications Ordered in UC Medications - No data to display  Initial Impression / Assessment and Plan / UC Course  I have reviewed the triage vital signs and the nursing notes.  Pertinent labs & imaging results that were available during my care of the patient were reviewed by me and considered in my medical decision making (see chart for details).  Patient presents for upper respiratory symptoms that started 1 day ago.  Vital signs are stable, he is in no acute distress.  His exam is reassuring.  He does have maxillary sinus pressure.  COVID/flu test is pending.  We will treat patient symptomatically with Bromfed, cetirizine D, and fluticasone.  Supportive care recommendations were provided to the patient.  Patient advised to follow-up within the next 7 to 10 days if symptoms worsen or do not improve. Final Clinical Impressions(s) / UC Diagnoses   Final diagnoses:  Acute upper respiratory infection     Discharge Instructions      COVID/flu test is pending.  If the results are positive, you will be  contacted. Take medication as directed. Increase fluids and get plenty of rest. May take over-the-counter ibuprofen or Tylenol as needed for pain, fever, or general discomfort. Recommend normal saline nasal spray to help with nasal congestion throughout the day. For your cough, it may be helpful to use a humidifier at bedtime during sleep. If your symptoms fail to improve within the next 7 to 10 days, please follow-up in our clinic.      ED Prescriptions     Medication Sig Dispense Auth. Provider   brompheniramine-pseudoephedrine-DM 30-2-10 MG/5ML syrup Take 5 mLs by mouth 4 (four) times daily as needed. 140 mL Ayde Record-Warren, Sadie Haber, NP   fluticasone (FLONASE) 50 MCG/ACT nasal spray Place 2 sprays into both nostrils daily. 16 g Lizzeth Meder-Warren, Sadie Haber, NP   cetirizine-pseudoephedrine (ZYRTEC-D) 5-120 MG tablet Take 1 tablet by mouth 2 (two) times daily. 30 tablet Zahli Vetsch-Warren, Sadie Haber, NP      PDMP not reviewed this encounter.   Abran Cantor, NP 04/06/22 1742

## 2022-04-06 NOTE — Discharge Instructions (Addendum)
COVID/flu test is pending.  If the results are positive, you will be contacted. Take medication as directed. Increase fluids and get plenty of rest. May take over-the-counter ibuprofen or Tylenol as needed for pain, fever, or general discomfort. Recommend normal saline nasal spray to help with nasal congestion throughout the day. For your cough, it may be helpful to use a humidifier at bedtime during sleep. If your symptoms fail to improve within the next 7 to 10 days, please follow-up in our clinic.

## 2022-04-08 LAB — COVID-19, FLU A+B NAA
Influenza A, NAA: NOT DETECTED
Influenza B, NAA: NOT DETECTED
SARS-CoV-2, NAA: NOT DETECTED

## 2022-06-25 ENCOUNTER — Ambulatory Visit
Admission: EM | Admit: 2022-06-25 | Discharge: 2022-06-25 | Disposition: A | Discharge status: Home / Self Care | Payer: No Typology Code available for payment source | Attending: Nurse Practitioner | Admitting: Nurse Practitioner

## 2022-06-25 ENCOUNTER — Encounter: Payer: Self-pay | Admitting: Emergency Medicine

## 2022-06-25 ENCOUNTER — Ambulatory Visit (INDEPENDENT_AMBULATORY_CARE_PROVIDER_SITE_OTHER): Payer: No Typology Code available for payment source

## 2022-06-25 DIAGNOSIS — S99921A Unspecified injury of right foot, initial encounter: Secondary | ICD-10-CM

## 2022-06-25 DIAGNOSIS — S91311A Laceration without foreign body, right foot, initial encounter: Secondary | ICD-10-CM

## 2022-06-25 MED ORDER — MUPIROCIN 2 % EX OINT
1.0000 | TOPICAL_OINTMENT | Freq: Two times a day (BID) | CUTANEOUS | 0 refills | Status: AC
Start: 2022-06-25 — End: ?

## 2022-06-25 NOTE — Discharge Instructions (Addendum)
Your x-rays are negative for fracture or dislocation. Apply medication as prescribed. May take ibuprofen or Tylenol for pain or discomfort. Apply ice to the right foot to help with pain and swelling.  Apply for 20 minutes, remove for 1 hour, then repeat as much as possible. Weightbearing as tolerated until symptoms improve. Follow-up in the emergency department immediately if you develop increasing redness, swelling, or foul-smelling drainage to the right foot.  Also if you develop fever, chills, chest pain, palpitations, or abdominal pain. Follow-up with your primary care physician for reevaluation within the next 7 to 10 days.

## 2022-06-25 NOTE — ED Triage Notes (Signed)
Rock hit right foot while weed eating on Friday.  Hurts to walk.

## 2022-06-25 NOTE — ED Provider Notes (Signed)
[facility]    CSN: [id number] Arrival date & time: 06/25/22  9485      History   Chief Complaint No chief complaint on file.   HPI Jason Sheppard is a 41 y.o. male.   The history is provided by the patient.   Patient presents for an injury to the right foot that occurred after he was weed eating and a rock flew into it 2 days ago.  States he was wearing sandals at the time.  Patient has a laceration to the area under the right great toe.  He also complains of bruising and tenderness to the area.  He states that he cleaned the area after the injury occurred.  States that he did go duck hunting yesterday and had on hunting boots.  States that he has had pain with walking.  He denies fever, chills, chest pain, shortness of breath, palpitations, abdominal pain, nausea, vomiting, or diarrhea.  Patient does have a history of type 2 diabetes.  States his last A1c was around 6.5.  Past Medical History:  Diagnosis Date   Allergy    Attention deficit disorder with hyperactivity    Depression    HIV (human immunodeficiency virus infection) (HCC)    Infective pericarditis    Migraine    OSA (obstructive sleep apnea)    Plantar fascial fibromatosis    PTSD (post-traumatic stress disorder)    Seizure disorder [facility])     Patient Active Problem List   Diagnosis Date Noted   Pericarditis 03/24/2019   CAP (community acquired pneumonia) 02/25/2019   Unintentional weight loss 06/06/2016   HIV disease (HCC) 12/15/2015   Dyslipidemia 12/15/2015   Hyperglycemia 12/15/2015   Sleep apnea 12/15/2015   Former smoker 12/15/2015   Attention deficit disorder with hyperactivity 12/15/2015   PTSD (post-traumatic stress disorder) 12/15/2015    Past Surgical History:  Procedure Laterality Date   HAND SURGERY     shrapnel removal         Home Medications    Prior to Admission medications   Medication Sig Start Date End Date Taking? Authorizing Provider  mupirocin ointment  (BACTROBAN) 2 % Apply 1 Application topically 2 (two) times daily. 06/25/22  Yes Darria Corvera-Warren, Sadie Haber, NP  bictegravir-emtricitabine-tenofovir AF (BIKTARVY) 50-200-25 MG TABS tablet Take 1 tablet by mouth every morning.     [provider]  brompheniramine-pseudoephedrine-DM 30-2-10 MG/5ML syrup Take 5 mLs by mouth 4 (four) times daily as needed. 04/06/22   Pailynn Vahey-Warren, Sadie Haber, NP  cetirizine-pseudoephedrine (ZYRTEC-D) 5-120 MG tablet Take 1 tablet by mouth 2 (two) times daily. 04/06/22   Mahayla Haddaway-Warren, Sadie Haber, NP  DULoxetine (CYMBALTA) 60 MG capsule Take 60 mg by mouth daily. 05/24/21   [provider]  fluticasone (FLONASE) 50 MCG/ACT nasal spray Place 2 sprays into both nostrils daily. 04/06/22   Myson Levi-Warren, Sadie Haber, NP  lidocaine (LIDODERM) 5 % Place 1 patch onto the skin daily. Remove & Discard patch within 12 hours or as directed by MD 02/13/22   Sabas Sous, MD  lisinopril (ZESTRIL) 5 MG tablet Take 10 mg by mouth daily.     [provider]  methocarbamol (ROBAXIN) 500 MG tablet Take 1 tablet (500 mg total) by mouth every 8 (eight) hours as needed for muscle spasms. 02/13/22   Sabas Sous, MD  montelukast (SINGULAIR) 10 MG tablet Take 1 tablet (10 mg total) by mouth every morning. 02/27/19   Glade Lloyd, MD  predniSONE (DELTASONE) 20 MG tablet Take  2 tablets (40 mg total) by mouth daily. 11/16/21   Mardella Layman, MD  promethazine-dextromethorphan (PROMETHAZINE-DM) 6.25-15 MG/5ML syrup Take 5 mLs by mouth 4 (four) times daily as needed for cough. 11/16/21   Mardella Layman, MD    Family History Family History  Problem Relation Age of Onset   Diabetes Mother    Heart disease Mother    Hypertension Mother    Diabetes Father    Heart disease Father    Hyperlipidemia Father     Social History Social History   Tobacco Use   Smoking status: Every Day    Packs/day: 1.00    Years: 14.00    Total pack years: 14.00    Types: Cigarettes    Last  attempt to quit: 10/23/2005    Years since quitting: 16.6   Smokeless tobacco: Never  Vaping Use   Vaping Use: Never used  Substance Use Topics   Alcohol use: No    Alcohol/week: 0.0 standard drinks of alcohol   Drug use: No     Allergies   Sulfa antibiotics, Atorvastatin, Keppra [levetiracetam], Oxcarbazepine, Iodine, Latex, and Statins   Review of Systems Review of Systems Per HPI  Physical Exam Triage Vital Signs ED Triage Vitals  Enc Vitals Group     BP 06/25/22 1005 125/76     Pulse Rate 06/25/22 1005 73     Resp 06/25/22 1005 16     Temp 06/25/22 1005 98.8 F (37.1 C)     Temp Source 06/25/22 1005 Oral     SpO2 06/25/22 1005 96 %     Weight --      Height --      Head Circumference --      Peak Flow --      Pain Score 06/25/22 1006 9     Pain Loc --      Pain Edu? --      Excl. in [place]? --    No data found.  Updated Vital Signs BP 125/76 (BP Location: Right Arm)   Pulse 73   Temp 98.8 F (37.1 C) (Oral)   Resp 16   SpO2 96%   Visual Acuity Right Eye Distance:   Left Eye Distance:   Bilateral Distance:    Right Eye Near:   Left Eye Near:    Bilateral Near:     Physical Exam Vitals and nursing note reviewed.  Constitutional:      General: He is not in acute distress.    Appearance: Normal appearance.  HENT:     Head: Normocephalic.  Cardiovascular:     Rate and Rhythm: Normal rate and regular rhythm.     Pulses: Normal pulses.     Heart sounds: Normal heart sounds.  Abdominal:     General: Bowel sounds are normal.  Musculoskeletal:     Right foot: Normal capillary refill. Laceration and tenderness present. Normal pulse.     Comments: Laceration that measures approximately 1.5 cm in length noted to the lateral aspect of the left great toe.  There is swelling with bruising and tenderness to the lateral aspect of the first metatarsal tarsal.  +2 DP/PT pulses, neurovascular status is intact.  There is no redness, warmth, or streaking up the right  lower extremity.  Skin:    General: Skin is warm and dry.  Neurological:     General: No focal deficit present.     Mental Status: He is alert and oriented to person, place, and time.  Psychiatric:  Mood and Affect: Mood normal.        Behavior: Behavior normal.      UC Treatments / Results  Labs (all labs ordered are listed, but only abnormal results are displayed) Labs Reviewed - No data to display  EKG   Radiology DG Foot Complete Right  Result Date: 06/25/2022 CLINICAL DATA:  Hit rock on first MTP joint with laceration and bruising. EXAM: RIGHT FOOT COMPLETE - 3+ VIEW COMPARISON:  None Available. FINDINGS: Mild soft tissue swelling about the great toe. No dislocations identified. No signs of acute fracture. No significant arthropathy identified. IMPRESSION: 1. Soft tissue swelling. 2. No acute bone abnormality. Electronically Signed   By: Signa Kell M.D.   On: 06/25/2022 10:25    Procedures Procedures (including critical care time)  Medications Ordered in UC Medications - No data to display  Initial Impression / Assessment and Plan / UC Course  I have reviewed the triage vital signs and the nursing notes.  Pertinent labs & imaging results that were available during my care of the patient were reviewed by me and considered in my medical decision making (see chart for details).  Patient presents for pain to the right great toe after he injured it while weed eating 2 days ago.  Patient has a laceration to the lateral aspect of the right great toe.  There is also bruising and ecchymosis surrounding the laceration.  Area is tender to palpation.  On exam, patient's neurovascular status is intact.  X-rays are negative for fracture or dislocation.  There is soft tissue swelling noted.  There is warmth, redness, or streaking up the lower extremity.  Symptoms are consistent with a soft tissue injury of the right foot.  Patient was prescribed mupirocin ointment to apply to the  area twice daily.  Supportive care recommendations were provided to the patient including continued wound care.  Discussed strict return precautions with the patient to include redness, swelling, foul-smelling drainage, or if he develops fever, chills, chest pain, or abdominal pain.  Patient advised to follow-up with his primary care physician for reevaluation. Final Clinical Impressions(s) / UC Diagnoses   Final diagnoses:  Laceration of right foot, initial encounter  Soft tissue injury of right foot, initial encounter     Discharge Instructions      Your x-rays are negative for fracture or dislocation. Apply medication as prescribed. May take ibuprofen or Tylenol for pain or discomfort. Apply ice to the right foot to help with pain and swelling.  Apply for 20 minutes, remove for 1 hour, then repeat as much as possible. Weightbearing as tolerated until symptoms improve. Follow-up in the emergency department immediately if you develop increasing redness, swelling, or foul-smelling drainage to the right foot.  Also if you develop fever, chills, chest pain, palpitations, or abdominal pain. Follow-up with your primary care physician for reevaluation within the next 7 to 10 days.     ED Prescriptions     Medication Sig Dispense Auth. Provider   mupirocin ointment (BACTROBAN) 2 % Apply 1 Application topically 2 (two) times daily. 22 g Urvi Imes-Warren, Sadie Haber, NP      PDMP not reviewed this encounter.   Abran Cantor, NP 06/25/22 1042

## 2022-07-11 ENCOUNTER — Emergency Department (HOSPITAL_COMMUNITY)
Admission: EM | Admit: 2022-07-11 | Discharge: 2022-07-11 | Disposition: A | Discharge status: Home / Self Care | Payer: No Typology Code available for payment source | Attending: Emergency Medicine | Admitting: Emergency Medicine

## 2022-07-11 ENCOUNTER — Encounter (HOSPITAL_COMMUNITY): Payer: Self-pay | Admitting: Emergency Medicine

## 2022-07-11 ENCOUNTER — Other Ambulatory Visit: Payer: Self-pay

## 2022-07-11 ENCOUNTER — Emergency Department (HOSPITAL_COMMUNITY): Payer: No Typology Code available for payment source

## 2022-07-11 DIAGNOSIS — Z79899 Other long term (current) drug therapy: Secondary | ICD-10-CM | POA: Insufficient documentation

## 2022-07-11 DIAGNOSIS — Z21 Asymptomatic human immunodeficiency virus [HIV] infection status: Secondary | ICD-10-CM | POA: Diagnosis not present

## 2022-07-11 DIAGNOSIS — R079 Chest pain, unspecified: Secondary | ICD-10-CM | POA: Diagnosis present

## 2022-07-11 DIAGNOSIS — I10 Essential (primary) hypertension: Secondary | ICD-10-CM | POA: Diagnosis not present

## 2022-07-11 DIAGNOSIS — E119 Type 2 diabetes mellitus without complications: Secondary | ICD-10-CM | POA: Insufficient documentation

## 2022-07-11 DIAGNOSIS — R55 Syncope and collapse: Secondary | ICD-10-CM | POA: Diagnosis not present

## 2022-07-11 DIAGNOSIS — Z91040 Latex allergy status: Secondary | ICD-10-CM | POA: Insufficient documentation

## 2022-07-11 DIAGNOSIS — Z7984 Long term (current) use of oral hypoglycemic drugs: Secondary | ICD-10-CM | POA: Insufficient documentation

## 2022-07-11 LAB — CBC
HCT: 45.2 % (ref 39.0–52.0)
Hemoglobin: 15.8 g/dL (ref 13.0–17.0)
MCH: 30.7 pg (ref 26.0–34.0)
MCHC: 35 g/dL (ref 30.0–36.0)
MCV: 87.9 fL (ref 80.0–100.0)
Platelets: 295 10*3/uL (ref 150–400)
RBC: 5.14 MIL/uL (ref 4.22–5.81)
RDW: 12.5 % (ref 11.5–15.5)
WBC: 8.7 10*3/uL (ref 4.0–10.5)
nRBC: 0 % (ref 0.0–0.2)

## 2022-07-11 LAB — BASIC METABOLIC PANEL
Anion gap: 8 (ref 5–15)
BUN: 19 mg/dL (ref 6–20)
CO2: 26 mmol/L (ref 22–32)
Calcium: 9.3 mg/dL (ref 8.9–10.3)
Chloride: 103 mmol/L (ref 98–111)
Creatinine, Ser: 0.94 mg/dL (ref 0.61–1.24)
GFR, Estimated: >60 mL/min (ref >60)
Glucose, Bld: 98 mg/dL (ref 70–99)
Potassium: 4 mmol/L (ref 3.5–5.1)
Sodium: 137 mmol/L (ref 135–145)

## 2022-07-11 LAB — TROPONIN I (HIGH SENSITIVITY): Troponin I (High Sensitivity): 3 ng/L (ref <18)

## 2022-07-11 MED ORDER — ASPIRIN 81 MG PO CHEW
324.0000 mg | CHEWABLE_TABLET | Freq: Once | ORAL | Status: AC
  Administered 2022-07-11: 324 mg via ORAL
  Filled 2022-07-11: qty 4

## 2022-07-11 NOTE — ED Provider Notes (Signed)
[facility] EMERGENCY DEPARTMENT Provider Note   CSN: [id number] Arrival date & time: 07/11/22  1940     History  Chief Complaint  Patient presents with   Chest Pain    Jason Sheppard is a 41 y.o. male.   Chest Pain  This patient is a 41 year old male, he is retired Nature conservation officer, history of anxiety and panic attacks, history of HIV, history of hypertension on lisinopril and a history of tobacco use.  He reports that in the past he had been diagnosed with infectious pericarditis which I have confirmed by review of the medical record.  He had been admitted on May 4 and discharged on May 7, because of having an elevated troponin with chest pain cardiology evaluated the patient, he was treated for community-acquired pneumonia, echocardiogram showed an ejection fraction of 55 to 60% at that time in 2020.  Thought that there might be some degree of pericarditis.  The patient does not usually have any chest pain but over the last several days he has had a feeling in the middle of his chest like a heaviness, he does not have any shortness of breath, he is not nauseated or vomiting though he did have a little bit of a cold sweat the other day.  He denies any swelling of the legs, he denies any sharp or stabbing chest pains, he has not had any syncopal episodes but has had a couple episodes of near syncope or dizziness that he associated with some palpitations.  He is not having that at this time.  He follows closely with the [facility] Medications Prior to Admission medications   Medication Sig Start Date End Date Taking? Authorizing Provider  bictegravir-emtricitabine-tenofovir AF (BIKTARVY) 50-200-25 MG TABS tablet Take 1 tablet by mouth every morning.    Yes [provider]  brompheniramine-pseudoephedrine-DM 30-2-10 MG/5ML syrup Take 5 mLs by mouth 4 (four) times daily as needed. 04/06/22  Yes Leath-Warren, Alda Lea, NP  cetirizine-pseudoephedrine (ZYRTEC-D) 5-120 MG  tablet Take 1 tablet by mouth 2 (two) times daily. 04/06/22  Yes Leath-Warren, Alda Lea, NP  docusate sodium (COLACE) 100 MG capsule Take 100 mg by mouth 2 (two) times daily.   Yes [provider]  DULoxetine (CYMBALTA) 60 MG capsule Take 60 mg by mouth daily. 05/24/21  Yes [provider]  fluticasone (FLONASE) 50 MCG/ACT nasal spray Place 2 sprays into both nostrils daily. 04/06/22  Yes Leath-Warren, Alda Lea, NP  lidocaine (LIDODERM) 5 % Place 1 patch onto the skin daily. Remove & Discard patch within 12 hours or as directed by MD 02/13/22  Yes Maudie Flakes, MD  lisinopril (ZESTRIL) 10 MG tablet TAKE ONE TABLET BY MOUTH DAILY FOR BLOOD PRESSURE 06/20/22  Yes [provider]  metFORMIN (GLUCOPHAGE) 500 MG tablet Take 500 mg by mouth 2 (two) times daily with a meal.   Yes [provider]  methocarbamol (ROBAXIN) 500 MG tablet Take 1 tablet (500 mg total) by mouth every 8 (eight) hours as needed for muscle spasms. 02/13/22  Yes Maudie Flakes, MD  montelukast (SINGULAIR) 10 MG tablet Take 1 tablet (10 mg total) by mouth every morning. 02/27/19  Yes Aline August, MD  oxyCODONE (OXY IR/ROXICODONE) 5 MG immediate release tablet Take 5 mg by mouth 3 (three) times daily as needed for severe pain. 06/27/22  Yes [provider]  prazosin (MINIPRESS) 2 MG capsule Take 2 mg by mouth at bedtime. 05/25/22  Yes [provider]  predniSONE (DELTASONE) 20 MG tablet Take 2 tablets (40 mg total) by mouth daily. 11/16/21  Yes Mardella Layman, MD  traZODone (DESYREL) 50 MG tablet Take 50 mg by mouth at bedtime. 05/25/22  Yes [provider]  mupirocin ointment (BACTROBAN) 2 % Apply 1 Application topically 2 (two) times daily. Patient not taking: Reported on 07/11/2022 06/25/22   Leath-Warren, Sadie Haber, NP  rosuvastatin (CRESTOR) 40 MG tablet TAKE ONE-HALF TABLET BY MOUTH ONCE A DAY FOR CHOLESTEROL Patient not taking: Reported on 07/11/2022 06/20/22   [provider]      Allergies    Sulfa antibiotics, Atorvastatin, Keppra [levetiracetam], Oxcarbazepine, Iodine, Latex, and Statins    Review of Systems   Review of Systems  Cardiovascular:  Positive for chest pain.  All other systems reviewed and are negative.   Physical Exam Updated Vital Signs BP (!) 123/93 (BP Location: Right Arm)   Pulse (!) 59   Temp 97.7 F (36.5 C) (Oral)   Resp (!) 21   Ht 1.753 m (5\' 9" )   Wt 84.4 kg   SpO2 94%   BMI 27.47 kg/m  Physical Exam Vitals and nursing note reviewed.  Constitutional:      General: He is not in acute distress.    Appearance: He is well-developed.  HENT:     Head: Normocephalic and atraumatic.     Mouth/Throat:     Pharynx: No oropharyngeal exudate.  Eyes:     General: No scleral icterus.       Right eye: No discharge.        Left eye: No discharge.     Conjunctiva/sclera: Conjunctivae normal.     Pupils: Pupils are equal, round, and reactive to light.  Neck:     Thyroid: No thyromegaly.     Vascular: No JVD.  Cardiovascular:     Rate and Rhythm: Normal rate and regular rhythm.     Heart sounds: Normal heart sounds. No murmur heard.    No friction rub. No gallop.  Pulmonary:     Effort: Pulmonary effort is normal. No respiratory distress.     Breath sounds: Normal breath sounds. No wheezing or rales.  Abdominal:     General: Bowel sounds are normal. There is no distension.     Palpations: Abdomen is soft. There is no mass.     Tenderness: There is no abdominal tenderness.  Musculoskeletal:        General: No tenderness. Normal range of motion.     Cervical back: Normal range of motion and neck supple.     Right lower leg: No edema.     Left lower leg: No edema.  Lymphadenopathy:     Cervical: No cervical adenopathy.  Skin:    General: Skin is warm and dry.     Findings: No erythema or rash.  Neurological:     Mental Status: He is alert.     Coordination: Coordination normal.  Psychiatric:         Behavior: Behavior normal.     ED Results / Procedures / Treatments   Labs (all labs ordered are listed, but only abnormal results are displayed) Labs Reviewed  BASIC METABOLIC PANEL  CBC  TROPONIN I (HIGH SENSITIVITY)    EKG EKG Interpretation  Date/Time:  Tuesday July 11 2022 19:51:37 EDT Ventricular Rate:  64 PR Interval:  148 QRS Duration: 102 QT Interval:  384 QTC Calculation: 396 R Axis:   84 Text Interpretation: Normal sinus rhythm Incomplete right  bundle branch block Borderline ECG When compared with ECG of 26-Feb-2019 00:02, No significant change was found Confirmed by Eber Hong ([id number]) on 07/11/2022 7:56:28 PM  Radiology DG Chest Portable 1 View  Result Date: 07/11/2022 CLINICAL DATA:  CP, dizziness since last night EXAM: PORTABLE CHEST 1 VIEW COMPARISON:  Chest x-ray 11/16/2021 FINDINGS: The heart and mediastinal contours are unchanged. No focal consolidation. No pulmonary edema. No pleural effusion. No pneumothorax. No acute osseous abnormality. IMPRESSION: No active disease. Electronically Signed   By: Tish Frederickson M.D.   On: 07/11/2022 20:16    Procedures Procedures    Medications Ordered in ED Medications  aspirin chewable tablet 324 mg (324 mg Oral Given 07/11/22 2017)    ED Course/ Medical Decision Making/ A&P                           Medical Decision Making Amount and/or Complexity of Data Reviewed Labs: ordered. Radiology: ordered.  Risk OTC drugs.   This patient presents to the ED for concern of chest pain and dizziness, this involves an extensive number of treatment options, and is a complaint that carries with it a high risk of complications and morbidity.  The differential diagnosis includes coronary syndrome of this seems less likely, PE seems very unlikely given normal oxygen normal heart rate and certainly an atypical chest discomfort, consider anxiety or PTSD, consider recurrent pericarditis but there is no murmurs rubs or  gallops and the EKG is unremarkable and actually very normal-appearing   Co morbidities that complicate the patient evaluation  Hypertension, diabetes, tobacco use, HIV   Additional history obtained:  Additional history obtained from patient as well as the medical record including prior admissions echocardiograms and follow-up with outpatient cardiology External records from outside source obtained and reviewed including as above   Lab Tests:  I Ordered, and personally interpreted labs.  The pertinent results include: 2 negative troponins, unremarkable CBC and metabolic panel   Imaging Studies ordered:  I ordered imaging studies including chest x-ray I independently visualized and interpreted imaging which showed no acute findings I agree with the radiologist interpretation   Cardiac Monitoring: / EKG:  The patient was maintained on a cardiac monitor.  I personally viewed and interpreted the cardiac monitored which showed an underlying rhythm of: No arrhythmias, normal sinus rhythm, EKG is reassuring and very normal    Problem List / ED Course / Critical interventions / Medication management  Chest pain totally resolved, work-up is been completely negative I have reviewed the patients home medicines and have made adjustments as needed   Social Determinants of Health:  None   Test / Admission - Considered:  Considered admission but the patient is a totally normal testing, is low risk, chest pain-free, vital signs normal, EKG normal, stable for discharge  I have discussed with the patient at the bedside the results, and the meaning of these results.  They have expressed her understanding to the need for follow-up with primary care physician        Final Clinical Impression(s) / ED Diagnoses Final diagnoses:  Chest pain, unspecified type  Near syncope    Rx / DC Orders ED Discharge Orders     None         Eber Hong, MD 07/11/22 2210

## 2022-07-11 NOTE — Discharge Instructions (Addendum)
Your testing was very normal without any signs of heart attack either on blood work or EKG and had a normal chest x-ray.  Please continue to take your medications exactly as prescribed and return for worsening symptoms.  I would recommend that she follow-up with your doctor in the office to make sure they can do some cardiac monitoring with you to make sure you are not having abnormal heart rhythms but we have not seen any today  Thank you for allowing [place] to treat you in the emergency department today.  After reviewing your examination and potential testing that was done it appears that you are safe to go home.  I would like for you to follow-up with your doctor within the next several days, have them obtain your results and follow-up with them to review all of these tests.  If you should develop severe or worsening symptoms return to the emergency department immediately

## 2022-07-11 NOTE — ED Triage Notes (Signed)
Pt with c/o intermittent chest pain since Sunday. States pain radiates up L side of neck "all day today" and had dizziness and SOB Sunday night.

## 2022-07-18 ENCOUNTER — Other Ambulatory Visit (HOSPITAL_COMMUNITY): Payer: Self-pay | Admitting: Vascular Surgery

## 2022-07-18 DIAGNOSIS — M7660 Achilles tendinitis, unspecified leg: Secondary | ICD-10-CM

## 2022-07-18 DIAGNOSIS — R42 Dizziness and giddiness: Secondary | ICD-10-CM

## 2022-08-10 ENCOUNTER — Ambulatory Visit (HOSPITAL_COMMUNITY)
Admission: RE | Admit: 2022-08-10 | Discharge: 2022-08-10 | Disposition: A | Discharge status: Home / Self Care | Payer: No Typology Code available for payment source | Source: Ambulatory Visit | Attending: Vascular Surgery | Admitting: Vascular Surgery

## 2022-08-10 DIAGNOSIS — R42 Dizziness and giddiness: Secondary | ICD-10-CM | POA: Insufficient documentation

## 2022-08-10 DIAGNOSIS — M7660 Achilles tendinitis, unspecified leg: Secondary | ICD-10-CM | POA: Diagnosis present

## 2022-10-05 ENCOUNTER — Encounter (HOSPITAL_COMMUNITY): Payer: Self-pay

## 2022-10-05 ENCOUNTER — Other Ambulatory Visit: Payer: Self-pay

## 2022-10-05 ENCOUNTER — Emergency Department (HOSPITAL_COMMUNITY)
Admission: EM | Admit: 2022-10-05 | Discharge: 2022-10-06 | Disposition: A | Discharge status: Home / Self Care | Payer: No Typology Code available for payment source | Attending: Emergency Medicine | Admitting: Emergency Medicine

## 2022-10-05 ENCOUNTER — Emergency Department (HOSPITAL_COMMUNITY): Payer: No Typology Code available for payment source

## 2022-10-05 DIAGNOSIS — Z79899 Other long term (current) drug therapy: Secondary | ICD-10-CM | POA: Diagnosis not present

## 2022-10-05 DIAGNOSIS — Z91040 Latex allergy status: Secondary | ICD-10-CM | POA: Insufficient documentation

## 2022-10-05 DIAGNOSIS — I10 Essential (primary) hypertension: Secondary | ICD-10-CM | POA: Insufficient documentation

## 2022-10-05 DIAGNOSIS — F17210 Nicotine dependence, cigarettes, uncomplicated: Secondary | ICD-10-CM | POA: Insufficient documentation

## 2022-10-05 DIAGNOSIS — Z21 Asymptomatic human immunodeficiency virus [HIV] infection status: Secondary | ICD-10-CM | POA: Insufficient documentation

## 2022-10-05 DIAGNOSIS — R1031 Right lower quadrant pain: Secondary | ICD-10-CM | POA: Diagnosis present

## 2022-10-05 LAB — LIPASE, BLOOD: Lipase: 36 U/L (ref 11–51)

## 2022-10-05 LAB — CBC
HCT: 39.8 % (ref 39.0–52.0)
Hemoglobin: 14.5 g/dL (ref 13.0–17.0)
MCH: 31.2 pg (ref 26.0–34.0)
MCHC: 36.4 g/dL — ABNORMAL HIGH (ref 30.0–36.0)
MCV: 85.6 fL (ref 80.0–100.0)
Platelets: 266 10*3/uL (ref 150–400)
RBC: 4.65 MIL/uL (ref 4.22–5.81)
RDW: 12.8 % (ref 11.5–15.5)
WBC: 9.4 10*3/uL (ref 4.0–10.5)
nRBC: 0 % (ref 0.0–0.2)

## 2022-10-05 LAB — COMPREHENSIVE METABOLIC PANEL
ALT: 35 U/L (ref 0–44)
AST: 20 U/L (ref 15–41)
Albumin: 4.2 g/dL (ref 3.5–5.0)
Alkaline Phosphatase: 53 U/L (ref 38–126)
Anion gap: 6 (ref 5–15)
BUN: 17 mg/dL (ref 6–20)
CO2: 25 mmol/L (ref 22–32)
Calcium: 8.9 mg/dL (ref 8.9–10.3)
Chloride: 107 mmol/L (ref 98–111)
Creatinine, Ser: 0.94 mg/dL (ref 0.61–1.24)
GFR, Estimated: >60 mL/min (ref >60)
Glucose, Bld: 158 mg/dL — ABNORMAL HIGH (ref 70–99)
Potassium: 3.7 mmol/L (ref 3.5–5.1)
Sodium: 138 mmol/L (ref 135–145)
Total Bilirubin: 0.7 mg/dL (ref 0.3–1.2)
Total Protein: 7.2 g/dL (ref 6.5–8.1)

## 2022-10-05 MED ORDER — IOHEXOL 300 MG/ML  SOLN
100.0000 mL | Freq: Once | INTRAMUSCULAR | Status: AC | PRN
  Administered 2022-10-05: 100 mL via INTRAVENOUS

## 2022-10-05 MED ORDER — OXYCODONE-ACETAMINOPHEN 5-325 MG PO TABS
1.0000 | ORAL_TABLET | Freq: Once | ORAL | Status: AC
  Administered 2022-10-05: 1 via ORAL
  Filled 2022-10-05: qty 1

## 2022-10-05 MED ORDER — DICYCLOMINE HCL 20 MG PO TABS: 20.0000 mg | ORAL_TABLET | Freq: Two times a day (BID) | ORAL | 0 refills | Status: AC

## 2022-10-05 MED ORDER — DICYCLOMINE HCL 10 MG PO CAPS
10.0000 mg | ORAL_CAPSULE | Freq: Once | ORAL | Status: AC
  Administered 2022-10-05: 10 mg via ORAL
  Filled 2022-10-05: qty 1

## 2022-10-05 MED ORDER — ONDANSETRON HCL 4 MG/2ML IJ SOLN
4.0000 mg | Freq: Once | INTRAMUSCULAR | Status: AC
  Administered 2022-10-05: 4 mg via INTRAVENOUS
  Filled 2022-10-05: qty 2

## 2022-10-05 MED ORDER — SODIUM CHLORIDE 0.9 % IV BOLUS
1000.0000 mL | Freq: Once | INTRAVENOUS | Status: AC
  Administered 2022-10-05: 1000 mL via INTRAVENOUS

## 2022-10-05 MED ORDER — MORPHINE SULFATE (PF) 4 MG/ML IV SOLN
4.0000 mg | Freq: Once | INTRAVENOUS | Status: AC
  Administered 2022-10-05: 4 mg via INTRAVENOUS
  Filled 2022-10-05: qty 1

## 2022-10-05 NOTE — ED Notes (Signed)
Pt returned from CT. [place] Jasneet Schobert,RN 

## 2022-10-05 NOTE — ED Notes (Signed)
Pt c/o RLQ abdominal pain that started at umbilicus around 6 PM  after he ate. Pain has worsened. Pain rated 10/10 sharp and stabbing in character. Abdomen is tender to touch and he is guarding. Last solid food was around 5:45PM today  and drink was around his arrival here.  Denies issues urinating. Last BM was today. Wardell Heath Gerrianne Scale

## 2022-10-05 NOTE — ED Triage Notes (Signed)
Pt arrived from home via POV w c/o LRQ abdominal pain 10/10.

## 2022-10-05 NOTE — ED Provider Notes (Signed)
[facility] EMERGENCY DEPARTMENT Provider Note   CSN: [id number] Arrival date & time: 10/05/22  2116     History  Chief Complaint  Patient presents with   Abdominal Pain    Viona GilmoreGlen E Sheppard is a 41 y.o. male.   Abdominal Pain   41 year old male presents emergency department with complaints of abdominal pain.  Patient reports symptoms again around 6 PM this afternoon after eating dinner.  He notes pain beginning the periumbilical region with radiation down to his right lower quadrant.  Denies history of similar symptoms.  Denies history abdominal surgeries.  Reports feelings of nausea of which he took his at home Zofran which resolved said symptoms.  Denies fever, chills, night sweats, chest pain, shortness of breath, urinary symptoms, change in bowel habits.  Past medical history significant for PTSD, seizure disorder, HIV, OSA  Home Medications Prior to Admission medications   Medication Sig Start Date End Date Taking? Authorizing Provider  dicyclomine (BENTYL) 20 MG tablet Take 1 tablet (20 mg total) by mouth 2 (two) times daily. 10/05/22  Yes Sherian Maroonobbins, Sylvester Minton A, PA  bictegravir-emtricitabine-tenofovir AF (BIKTARVY) 50-200-25 MG TABS tablet Take 1 tablet by mouth every morning.     [provider]  brompheniramine-pseudoephedrine-DM 30-2-10 MG/5ML syrup Take 5 mLs by mouth 4 (four) times daily as needed. 04/06/22   Leath-Warren, Sadie Haberhristie J, NP  cetirizine-pseudoephedrine (ZYRTEC-D) 5-120 MG tablet Take 1 tablet by mouth 2 (two) times daily. 04/06/22   Leath-Warren, Sadie Haberhristie J, NP  docusate sodium (COLACE) 100 MG capsule Take 100 mg by mouth 2 (two) times daily.    [provider]  DULoxetine (CYMBALTA) 60 MG capsule Take 60 mg by mouth daily. 05/24/21   [provider]  fluticasone (FLONASE) 50 MCG/ACT nasal spray Place 2 sprays into both nostrils daily. 04/06/22   Leath-Warren, Sadie Haberhristie J, NP  lidocaine (LIDODERM) 5 % Place 1 patch onto the skin daily. Remove  & Discard patch within 12 hours or as directed by MD 02/13/22   Sabas SousBero, Michael M, MD  lisinopril (ZESTRIL) 10 MG tablet TAKE ONE TABLET BY MOUTH DAILY FOR BLOOD PRESSURE 06/20/22   [provider]  metFORMIN (GLUCOPHAGE) 500 MG tablet Take 500 mg by mouth 2 (two) times daily with a meal.    [provider]  methocarbamol (ROBAXIN) 500 MG tablet Take 1 tablet (500 mg total) by mouth every 8 (eight) hours as needed for muscle spasms. 02/13/22   Sabas SousBero, Michael M, MD  montelukast (SINGULAIR) 10 MG tablet Take 1 tablet (10 mg total) by mouth every morning. 02/27/19   Glade LloydAlekh, Kshitiz, MD  mupirocin ointment (BACTROBAN) 2 % Apply 1 Application topically 2 (two) times daily. Patient not taking: Reported on 07/11/2022 06/25/22   Leath-Warren, Sadie Haberhristie J, NP  oxyCODONE (OXY IR/ROXICODONE) 5 MG immediate release tablet Take 5 mg by mouth 3 (three) times daily as needed for severe pain. 06/27/22   [provider]  prazosin (MINIPRESS) 2 MG capsule Take 2 mg by mouth at bedtime. 05/25/22   [provider]  predniSONE (DELTASONE) 20 MG tablet Take 2 tablets (40 mg total) by mouth daily. 11/16/21   Mardella LaymanHagler, Brian, MD  rosuvastatin (CRESTOR) 40 MG tablet TAKE ONE-HALF TABLET BY MOUTH ONCE A DAY FOR CHOLESTEROL Patient not taking: Reported on 07/11/2022 06/20/22   [provider]  traZODone (DESYREL) 50 MG tablet Take 50 mg by mouth at bedtime. 05/25/22   [provider]      Allergies    Sulfa antibiotics, Atorvastatin,  Keppra [levetiracetam], Oxcarbazepine, Iodine, Latex, and Statins    Review of Systems   Review of Systems  Gastrointestinal:  Positive for abdominal pain.  All other systems reviewed and are negative.   Physical Exam Updated Vital Signs BP (!) 145/97 (BP Location: Right Arm)   Pulse 81   Temp 98.1 F (36.7 C) (Oral)   Resp (!) 28   Ht 5\' 8"  (1.727 m)   Wt 82.6 kg   SpO2 100%   BMI 27.67 kg/m  Physical Exam Vitals and nursing note reviewed.   Constitutional:      General: He is not in acute distress.    Appearance: He is well-developed.  HENT:     Head: Normocephalic and atraumatic.  Eyes:     Conjunctiva/sclera: Conjunctivae normal.  Cardiovascular:     Rate and Rhythm: Normal rate and regular rhythm.     Heart sounds: No murmur heard. Pulmonary:     Effort: Pulmonary effort is normal. No respiratory distress.     Breath sounds: Normal breath sounds.  Abdominal:     Palpations: Abdomen is soft.     Tenderness: There is abdominal tenderness in the right lower quadrant. Positive signs include McBurney's sign. Negative signs include Murphy's sign.     Comments: Heeltap sign negative.  No peritoneal signs noted.  Musculoskeletal:        General: No swelling.     Cervical back: Neck supple.  Skin:    General: Skin is warm and dry.     Capillary Refill: Capillary refill takes less than 2 seconds.  Neurological:     Mental Status: He is alert.  Psychiatric:        Mood and Affect: Mood normal.     ED Results / Procedures / Treatments   Labs (all labs ordered are listed, but only abnormal results are displayed) Labs Reviewed  COMPREHENSIVE METABOLIC PANEL - Abnormal; Notable for the following components:      Result Value   Glucose, Bld 158 (*)    All other components within normal limits  CBC - Abnormal; Notable for the following components:   MCHC 36.4 (*)    All other components within normal limits  LIPASE, BLOOD  URINALYSIS, ROUTINE W REFLEX MICROSCOPIC    EKG EKG Interpretation  Date/Time:  Thursday October 05 2022 21:36:22 EST Ventricular Rate:  74 PR Interval:  152 QRS Duration: 102 QT Interval:  358 QTC Calculation: 397 R Axis:   90 Text Interpretation: Normal sinus rhythm Pulmonary disease pattern Incomplete right bundle branch block Possible Right ventricular hypertrophy Abnormal ECG When compared with ECG of 11-Jul-2022 19:51, No significant change was found No significant change since last  tracing Confirmed by 13-Jul-[id number]) on 10/05/2022 9:53:33 PM  Radiology CT Abdomen Pelvis W Contrast  Result Date: 10/05/2022 CLINICAL DATA:  Right lower quadrant pain EXAM: CT ABDOMEN AND PELVIS WITH CONTRAST TECHNIQUE: Multidetector CT imaging of the abdomen and pelvis was performed using the standard protocol following bolus administration of intravenous contrast. RADIATION DOSE REDUCTION: This exam was performed according to the departmental dose-optimization program which includes automated exposure control, adjustment of the mA and/or kV according to patient size and/or use of iterative reconstruction technique. CONTRAST:  10/07/2022 OMNIPAQUE IOHEXOL 300 MG/ML  SOLN COMPARISON:  10/24/2021 FINDINGS: Lower chest: No acute pleural or parenchymal lung disease. Hypoventilatory changes within the dependent lower lobes. Hepatobiliary: Hepatic steatosis. No focal liver abnormality. The gallbladder is unremarkable. No biliary duct dilation. Pancreas: Unremarkable. No pancreatic ductal  dilatation or surrounding inflammatory changes. Spleen: Normal in size without focal abnormality. Adrenals/Urinary Tract: Chronic left renal atrophy, with compensatory hypertrophy of the right kidney, stable. No urinary tract calculi or obstructive uropathy. The adrenals and bladder are unremarkable. Stomach/Bowel: No bowel obstruction or ileus. Normal appendix right lower quadrant. No bowel wall thickening or inflammatory change. Vascular/Lymphatic: Aortic atherosclerosis. No enlarged abdominal or pelvic lymph nodes. Reproductive: Prostate is unremarkable. Other: No free fluid or free intraperitoneal gas. No abdominal wall hernia. Musculoskeletal: No acute or destructive bony lesions. Reconstructed images demonstrate no additional findings. IMPRESSION: 1. No acute intra-abdominal or intrapelvic process. Normal appendix. 2. Hepatic steatosis. 3. Chronic left renal atrophy and compensatory hypertrophy of the right kidney. 4.   Aortic Atherosclerosis (ICD10-I70.0). Electronically Signed   By: Sharlet Salina M.D.   On: 10/05/2022 22:52    Procedures Procedures    Medications Ordered in ED Medications  dicyclomine (BENTYL) capsule 10 mg (has no administration in time range)  sodium chloride 0.9 % bolus 1,000 mL (0 mLs Intravenous Stopped 10/05/22 2304)  morphine (PF) 4 MG/ML injection 4 mg (4 mg Intravenous Given 10/05/22 2201)  ondansetron (ZOFRAN) injection 4 mg (4 mg Intravenous Given 10/05/22 2201)  iohexol (OMNIPAQUE) 300 MG/ML solution 100 mL (100 mLs Intravenous Contrast Given 10/05/22 2236)    ED Course/ Medical Decision Making/ A&P                           Medical Decision Making Amount and/or Complexity of Data Reviewed Labs: ordered. Radiology: ordered.  Risk Prescription drug management.   This patient presents to the ED for concern of abdominal pain, this involves an extensive number of treatment options, and is a complaint that carries with it a high risk of complications and morbidity.  The differential diagnosis includes appendicitis, diverticulitis, volvulus, SBO/LBO, nephrolithiasis, urinary tract infection, cholecystitis, CBD pathology, pancreatitis, AAA   Co morbidities that complicate the patient evaluation  See HPI   Additional history obtained:  Additional history obtained from EMR External records from outside source obtained and reviewed including hospital records   Lab Tests:  I Ordered, and personally interpreted labs.  The pertinent results include: No leukocytosis.  No evidence of anemia.  Platelets within normal range.  No electrolyte abnormalities noted.  No transaminitis.  No renal dysfunction.  Lipase within normal range.  UA pending   Imaging Studies ordered:  I ordered imaging studies including CT abdomen Elvis I independently visualized and interpreted imaging which showed no acute intra-abdominal or intrapelvic process.  Normal appendix.  Hepatic  steatosis.  Chronic left renal atrophy and compensatory hypertrophy of right kidney.  Aortic atherosclerosis. I agree with the radiologist interpretation  Cardiac Monitoring: / EKG:  The patient was maintained on a cardiac monitor.  I personally viewed and interpreted the cardiac monitored which showed an underlying rhythm of: Sinus rhythm   Consultations Obtained:  See ED course  Problem List / ED Course / Critical interventions / Medication management  Right lower quadrant abdominal pain I ordered medication including morphine for pain.  Zofran for nausea.  1 L normal saline for rehydration.   Reevaluation of the patient after these medicines showed that the patient improved I have reviewed the patients home medicines and have made adjustments as needed   Social Determinants of Health:  Chronic cigarette use.  Denies illicit drug use.   Test / Admission - Considered:  Right lower quadrant abdominal pain Vitals signs significant for mild  hypertension with blood pressure 145/97.  Recommend follow-up with primary care regarding ablation blood pressure.  Patient initially tachypneic with a respiratory rate of 28 most likely secondary to patient's pain of which decreased with administration of pain medication while emergency department.. Otherwise within normal range and stable throughout visit. Laboratory/imaging studies significant for: Breath Unsure of exact etiology of patient's right lower quadrant abdominal pain.  Appendix visualized and showed no acute abnormality.  Pain does not sound like nephrolithiasis/ureterolithiasis with no evidence of right-sided hydronephrosis and no subsequent urinary symptoms.  Patient reassured with overall negative findings.  Recommended treatment at home with Bentyl, Motrin as well as Zofran as needed.  Follow-up with primary care recommended for reevaluation of symptoms.  Treatment plan discussed with the patient and he acknowledged understanding was  agreeable to said plan. Worrisome signs and symptoms were discussed with the patient and the patient acknowledged to return to emergency department if notice.  Patient stable upon discharge from the hospital.        Final Clinical Impression(s) / ED Diagnoses Final diagnoses:  RLQ abdominal pain    Rx / DC Orders ED Discharge Orders          Ordered    dicyclomine (BENTYL) 20 MG tablet  2 times daily        10/05/22 2305              Peter Garter, [place] 10/05/22 2312    Jacalyn Lefevre, MD 10/05/22 2315

## 2022-10-05 NOTE — ED Notes (Signed)
Pt transported to CT. Teryn Boerema Demeisha Geraghty,RN 

## 2022-10-05 NOTE — Discharge Instructions (Addendum)
Note that your visit emergency department today was overall reassuring.  CT scan of your abdomen and pelvis showed no acute abnormalities.  Recommend taking Bentyl for antispasmodic agent with abdomen as well as continue taking Zofran as needed for nausea.  Recommend reevaluation by your primary care.  Please do not hesitate to return to emergency department for worrisome signs and symptoms we discussed become apparent.

## 2022-10-06 LAB — URINALYSIS, ROUTINE W REFLEX MICROSCOPIC
Bilirubin Urine: NEGATIVE
Glucose, UA: NEGATIVE mg/dL
Hgb urine dipstick: NEGATIVE
Ketones, ur: NEGATIVE mg/dL
Leukocytes,Ua: NEGATIVE
Nitrite: NEGATIVE
Protein, ur: 30 mg/dL — AB
Specific Gravity, Urine: 1.025 (ref 1.005–1.030)
pH: 7 (ref 5.0–8.0)

## 2022-12-13 ENCOUNTER — Telehealth: Payer: Self-pay | Admitting: Gastroenterology

## 2022-12-13 NOTE — Telephone Encounter (Signed)
Hi Dr. Candis Schatz,   This patient brought in his most recent consult from the [place] who referred him here.  He wants to have an EGD and colonoscopy done, due to abdominal pain.  He also states that the [place] no longer uses anesthesia for procedures which is another reason he wants to be seen here.  I am sending you the records he brought with him for your review.  Please let me know how you would like me to proceed.    Thank you.

## 2022-12-18 ENCOUNTER — Encounter: Payer: Self-pay | Admitting: Gastroenterology

## 2023-01-17 ENCOUNTER — Other Ambulatory Visit: Payer: Self-pay

## 2023-01-17 ENCOUNTER — Encounter (HOSPITAL_COMMUNITY): Payer: Self-pay | Admitting: Emergency Medicine

## 2023-01-17 ENCOUNTER — Emergency Department (HOSPITAL_COMMUNITY): Payer: No Typology Code available for payment source

## 2023-01-17 ENCOUNTER — Emergency Department (HOSPITAL_COMMUNITY)
Admission: EM | Admit: 2023-01-17 | Discharge: 2023-01-17 | Disposition: A | Discharge status: Home / Self Care | Payer: No Typology Code available for payment source | Attending: Emergency Medicine | Admitting: Emergency Medicine

## 2023-01-17 DIAGNOSIS — J020 Streptococcal pharyngitis: Secondary | ICD-10-CM

## 2023-01-17 DIAGNOSIS — E119 Type 2 diabetes mellitus without complications: Secondary | ICD-10-CM | POA: Insufficient documentation

## 2023-01-17 DIAGNOSIS — R079 Chest pain, unspecified: Secondary | ICD-10-CM

## 2023-01-17 DIAGNOSIS — Z21 Asymptomatic human immunodeficiency virus [HIV] infection status: Secondary | ICD-10-CM | POA: Insufficient documentation

## 2023-01-17 DIAGNOSIS — R1032 Left lower quadrant pain: Secondary | ICD-10-CM | POA: Diagnosis not present

## 2023-01-17 DIAGNOSIS — R0789 Other chest pain: Secondary | ICD-10-CM | POA: Insufficient documentation

## 2023-01-17 DIAGNOSIS — Z91040 Latex allergy status: Secondary | ICD-10-CM | POA: Diagnosis not present

## 2023-01-17 DIAGNOSIS — B950 Streptococcus, group A, as the cause of diseases classified elsewhere: Secondary | ICD-10-CM | POA: Diagnosis not present

## 2023-01-17 DIAGNOSIS — Z7984 Long term (current) use of oral hypoglycemic drugs: Secondary | ICD-10-CM | POA: Insufficient documentation

## 2023-01-17 DIAGNOSIS — Z20822 Contact with and (suspected) exposure to covid-19: Secondary | ICD-10-CM | POA: Insufficient documentation

## 2023-01-17 DIAGNOSIS — Z79899 Other long term (current) drug therapy: Secondary | ICD-10-CM | POA: Diagnosis not present

## 2023-01-17 LAB — HEPATIC FUNCTION PANEL
ALT: 28 U/L (ref 0–44)
AST: 17 U/L (ref 15–41)
Albumin: 4.3 g/dL (ref 3.5–5.0)
Alkaline Phosphatase: 105 U/L (ref 38–126)
Bilirubin, Direct: 0.1 mg/dL (ref 0.0–0.2)
Indirect Bilirubin: 0.5 mg/dL (ref 0.3–0.9)
Total Bilirubin: 0.6 mg/dL (ref 0.3–1.2)
Total Protein: 7.7 g/dL (ref 6.5–8.1)

## 2023-01-17 LAB — RESP PANEL BY RT-PCR (RSV, FLU A&B, COVID)  RVPGX2
Influenza A by PCR: NEGATIVE
Influenza B by PCR: NEGATIVE
Resp Syncytial Virus by PCR: NEGATIVE
SARS Coronavirus 2 by RT PCR: NEGATIVE

## 2023-01-17 LAB — APTT: aPTT: 37 seconds — ABNORMAL HIGH (ref 24–36)

## 2023-01-17 LAB — URINALYSIS, W/ REFLEX TO CULTURE (INFECTION SUSPECTED)
Bacteria, UA: NONE SEEN
Bilirubin Urine: NEGATIVE
Glucose, UA: NEGATIVE mg/dL
Hgb urine dipstick: NEGATIVE
Ketones, ur: NEGATIVE mg/dL
Leukocytes,Ua: NEGATIVE
Nitrite: NEGATIVE
Protein, ur: NEGATIVE mg/dL
Specific Gravity, Urine: 1.018 (ref 1.005–1.030)
pH: 7 (ref 5.0–8.0)

## 2023-01-17 LAB — CBC
HCT: 42.3 % (ref 39.0–52.0)
Hemoglobin: 15 g/dL (ref 13.0–17.0)
MCH: 30.5 pg (ref 26.0–34.0)
MCHC: 35.5 g/dL (ref 30.0–36.0)
MCV: 86.2 fL (ref 80.0–100.0)
Platelets: 258 10*3/uL (ref 150–400)
RBC: 4.91 MIL/uL (ref 4.22–5.81)
RDW: 12.2 % (ref 11.5–15.5)
WBC: 9.1 10*3/uL (ref 4.0–10.5)
nRBC: 0 % (ref 0.0–0.2)

## 2023-01-17 LAB — BASIC METABOLIC PANEL
Anion gap: 8 (ref 5–15)
BUN: 12 mg/dL (ref 6–20)
CO2: 25 mmol/L (ref 22–32)
Calcium: 8.7 mg/dL — ABNORMAL LOW (ref 8.9–10.3)
Chloride: 101 mmol/L (ref 98–111)
Creatinine, Ser: 0.93 mg/dL (ref 0.61–1.24)
GFR, Estimated: >60 mL/min (ref >60)
Glucose, Bld: 143 mg/dL — ABNORMAL HIGH (ref 70–99)
Potassium: 3.9 mmol/L (ref 3.5–5.1)
Sodium: 134 mmol/L — ABNORMAL LOW (ref 135–145)

## 2023-01-17 LAB — GROUP A STREP BY PCR: Group A Strep by PCR: DETECTED — AB

## 2023-01-17 LAB — PROTIME-INR
INR: 1 (ref 0.8–1.2)
Prothrombin Time: 13.5 seconds (ref 11.4–15.2)

## 2023-01-17 LAB — TROPONIN I (HIGH SENSITIVITY)
Troponin I (High Sensitivity): 9 ng/L (ref <18)
Troponin I (High Sensitivity): 9 ng/L (ref <18)

## 2023-01-17 LAB — LACTIC ACID, PLASMA: Lactic Acid, Venous: 1.3 mmol/L (ref 0.5–1.9)

## 2023-01-17 MED ORDER — SODIUM CHLORIDE 0.9 % IV SOLN
2.0000 g | Freq: Once | INTRAVENOUS | Status: AC
  Administered 2023-01-17: 2 g via INTRAVENOUS
  Filled 2023-01-17: qty 20

## 2023-01-17 MED ORDER — LACTATED RINGERS IV SOLN: INTRAVENOUS | Status: DC

## 2023-01-17 MED ORDER — LACTATED RINGERS IV BOLUS (SEPSIS)
1000.0000 mL | Freq: Once | INTRAVENOUS | Status: AC
  Administered 2023-01-17: 1000 mL via INTRAVENOUS

## 2023-01-17 MED ORDER — PENICILLIN V POTASSIUM 500 MG PO TABS: 500.0000 mg | ORAL_TABLET | Freq: Three times a day (TID) | ORAL | 0 refills | Status: DC

## 2023-01-17 MED ORDER — METRONIDAZOLE 500 MG/100ML IV SOLN
500.0000 mg | Freq: Once | INTRAVENOUS | Status: AC
  Administered 2023-01-17: 500 mg via INTRAVENOUS
  Filled 2023-01-17: qty 100

## 2023-01-17 MED ORDER — ACETAMINOPHEN 325 MG PO TABS
650.0000 mg | ORAL_TABLET | Freq: Once | ORAL | Status: AC | PRN
  Administered 2023-01-17: 650 mg via ORAL
  Filled 2023-01-17: qty 2

## 2023-01-17 MED ORDER — IOHEXOL 300 MG/ML  SOLN
100.0000 mL | Freq: Once | INTRAMUSCULAR | Status: AC | PRN
  Administered 2023-01-17: 100 mL via INTRAVENOUS

## 2023-01-17 NOTE — ED Notes (Signed)
Back from CT

## 2023-01-17 NOTE — ED Provider Notes (Signed)
Received handoff from [name] NP, pending CT abd/pelvis and lactic acid for dispo. Anosocopy yesterday, now febrile.   Physical Exam  BP 121/79   Pulse 91   Temp 99.3 F (37.4 C) (Oral)   Resp (!) 28   Ht 5\' 8"  (1.727 m)   Wt 88.5 kg   SpO2 95%   BMI 29.65 kg/m   Physical Exam Vitals and nursing note reviewed.  Constitutional:      General: He is not in acute distress.    Appearance: He is well-developed.  HENT:     Head: Normocephalic and atraumatic.  Eyes:     Conjunctiva/sclera: Conjunctivae normal.  Cardiovascular:     Rate and Rhythm: Normal rate and regular rhythm.     Heart sounds: No murmur heard. Pulmonary:     Effort: Pulmonary effort is normal. No respiratory distress.     Breath sounds: Normal breath sounds.  Abdominal:     Palpations: Abdomen is soft.     Tenderness: There is no abdominal tenderness.  Musculoskeletal:        General: No swelling.     Cervical back: Neck supple.  Skin:    General: Skin is warm and dry.     Capillary Refill: Capillary refill takes less than 2 seconds.  Neurological:     Mental Status: He is alert.  Psychiatric:        Mood and Affect: Mood normal.     Procedures  Procedures  ED Course / MDM    Medical Decision Making Patient is a 42 year old male, here for fever of unknown origin.  He was found to have strep.  Negative for UTI, pneumonia, negative CT abdomen pelvis scan.  Trops within normal limits.  I encouraged him to follow-up with his PCP, for repeat eval.  Heart score is 2.  Return precautions emphasized.  I believe that is fevers likely secondary to strep, which makes sense as his grandchildren have strep at this time.  Prescribed penicillin.   Amount and/or Complexity of Data Reviewed Labs: ordered. Radiology: ordered.  Risk OTC drugs. Prescription drug management.        Osvaldo Shipper, [place] 01/17/23 2047    Milton Ferguson, MD 01/18/23 [phone]

## 2023-01-17 NOTE — Discharge Instructions (Addendum)
Please follow-up with your primary care doctor.  Have your loved ones that you kiss, share utensils,  or cups, be tested. Return to the ER if you have worsening pain.

## 2023-01-17 NOTE — ED Triage Notes (Signed)
Left sided chest pain started yesterday. Pt states fever of 104 prior to  arrival. Denies nausea some SOB. States "I just don't feel good"

## 2023-01-17 NOTE — ED Notes (Signed)
To CT

## 2023-01-17 NOTE — ED Provider Notes (Signed)
[facility] Provider Note   CSN: [id number] Arrival date & time: 01/17/23  1735     History  Chief Complaint  Patient presents with   Chest Pain    Jason Sheppard is a 42 y.o. male.   Chest Pain Associated symptoms: abdominal pain, cough and fever   Associated symptoms: no back pain, no dizziness, no dysphagia, no nausea, no numbness, no shortness of breath, no vomiting and no weakness        Jason Sheppard is a 42 y.o. male with past medical history of PTSD, HIV, patient reported diabetes  who presents to the Emergency Department complaining of left-sided chest pain, left lower abdominal pain, fever and chills.  He has routine 84-month anoscopies for evaluation of rectal lesion patient had an anoscopy yesterday at [facility]. states an area was biopsied.  He has been feeling unwell since the procedure.  Complains of pain of his left lower abdomen and pelvis.  Fever and chills today.  Subjective fever at home earlier of 104 orally.  Has not taken any fever reducing medications.  Describes squeezing pain of his left chest and occasional cough.  He is having some rectal bleeding, but states this is typical after having his anoscopies.  Constipated today.  States he contacted nurse line and was advised to come to the emergency department for further evaluation. Also, he states that his grandchildren live with him and the children all have strep throat.  He denies any throat pain, vomiting, diarrhea  Home Medications Prior to Admission medications   Medication Sig Start Date End Date Taking? Authorizing Provider  bictegravir-emtricitabine-tenofovir AF (BIKTARVY) 50-200-25 MG TABS tablet Take 1 tablet by mouth every morning.     [provider]  brompheniramine-pseudoephedrine-DM 30-2-10 MG/5ML syrup Take 5 mLs by mouth 4 (four) times daily as needed. 04/06/22   Leath-Warren, Alda Lea, NP  cetirizine-pseudoephedrine (ZYRTEC-D) 5-120  MG tablet Take 1 tablet by mouth 2 (two) times daily. 04/06/22   Leath-Warren, Alda Lea, NP  dicyclomine (BENTYL) 20 MG tablet Take 1 tablet (20 mg total) by mouth 2 (two) times daily. 10/05/22   Wilnette Kales, PA  docusate sodium (COLACE) 100 MG capsule Take 100 mg by mouth 2 (two) times daily.    [provider]  DULoxetine (CYMBALTA) 60 MG capsule Take 60 mg by mouth daily. 05/24/21   [provider]  fluticasone (FLONASE) 50 MCG/ACT nasal spray Place 2 sprays into both nostrils daily. 04/06/22   Leath-Warren, Alda Lea, NP  lidocaine (LIDODERM) 5 % Place 1 patch onto the skin daily. Remove & Discard patch within 12 hours or as directed by MD 02/13/22   Maudie Flakes, MD  lisinopril (ZESTRIL) 10 MG tablet TAKE ONE TABLET BY MOUTH DAILY FOR BLOOD PRESSURE 06/20/22   [provider]  metFORMIN (GLUCOPHAGE) 500 MG tablet Take 500 mg by mouth 2 (two) times daily with a meal.    [provider]  methocarbamol (ROBAXIN) 500 MG tablet Take 1 tablet (500 mg total) by mouth every 8 (eight) hours as needed for muscle spasms. 02/13/22   Maudie Flakes, MD  montelukast (SINGULAIR) 10 MG tablet Take 1 tablet (10 mg total) by mouth every morning. 02/27/19   Aline August, MD  mupirocin ointment (BACTROBAN) 2 % Apply 1 Application topically 2 (two) times daily. Patient not taking: Reported on 07/11/2022 06/25/22   Leath-Warren, Alda Lea, NP  oxyCODONE (OXY IR/ROXICODONE) 5 MG immediate release  tablet Take 5 mg by mouth 3 (three) times daily as needed for severe pain. 06/27/22   [provider]  prazosin (MINIPRESS) 2 MG capsule Take 2 mg by mouth at bedtime. 05/25/22   [provider]  predniSONE (DELTASONE) 20 MG tablet Take 2 tablets (40 mg total) by mouth daily. 11/16/21   Vanessa Kick, MD  rosuvastatin (CRESTOR) 40 MG tablet TAKE ONE-HALF TABLET BY MOUTH ONCE A DAY FOR CHOLESTEROL Patient not taking: Reported on 07/11/2022 06/20/22   [provider]   traZODone (DESYREL) 50 MG tablet Take 50 mg by mouth at bedtime. 05/25/22   [provider]      Allergies    Sulfa antibiotics, Atorvastatin, Keppra [levetiracetam], Oxcarbazepine, Iodine, Latex, and Statins    Review of Systems   Review of Systems  Constitutional:  Positive for chills and fever. Negative for appetite change.  HENT:  Negative for sore throat and trouble swallowing.   Respiratory:  Positive for cough. Negative for shortness of breath.   Cardiovascular:  Positive for chest pain.  Gastrointestinal:  Positive for abdominal pain and constipation. Negative for abdominal distention, nausea and vomiting.  Genitourinary:  Negative for difficulty urinating and dysuria.  Musculoskeletal:  Negative for back pain.  Skin:  Negative for rash.  Neurological:  Negative for dizziness, weakness and numbness.  Psychiatric/Behavioral:  Negative for confusion.     Physical Exam Updated Vital Signs BP (!) 141/91 (BP Location: Right Arm)   Pulse 91   Temp (!) 102.1 F (38.9 C) (Oral)   Resp 18   Ht 5\' 8"  (1.727 m)   Wt 88.5 kg   SpO2 93%   BMI 29.65 kg/m  Physical Exam Vitals and nursing note reviewed.  Constitutional:      General: He is not in acute distress.    Appearance: He is not ill-appearing or toxic-appearing.  HENT:     Mouth/Throat:     Mouth: Mucous membranes are moist.     Pharynx: Oropharynx is clear. No oropharyngeal exudate or posterior oropharyngeal erythema.  Cardiovascular:     Rate and Rhythm: Normal rate and regular rhythm.     Pulses: Normal pulses.  Pulmonary:     Effort: Pulmonary effort is normal. No respiratory distress.     Breath sounds: No wheezing or rales.  Abdominal:     Palpations: Abdomen is soft.     Tenderness: There is abdominal tenderness.     Comments: Tenderness left lower quadrant.  No guarding or rebound.  Abdomen is soft  Musculoskeletal:        General: Normal range of motion.     Cervical back: Normal range of  motion.  Lymphadenopathy:     Cervical: No cervical adenopathy.  Skin:    General: Skin is warm.     Capillary Refill: Capillary refill takes less than 2 seconds.  Neurological:     General: No focal deficit present.     Mental Status: He is alert.     Sensory: No sensory deficit.     Motor: No weakness.     ED Results / Procedures / Treatments   Labs (all labs ordered are listed, but only abnormal results are displayed) Labs Reviewed  BASIC METABOLIC PANEL - Abnormal; Notable for the following components:      Result Value   Sodium 134 (*)    Glucose, Bld 143 (*)    Calcium 8.7 (*)    All other components within normal limits  RESP PANEL BY  RT-PCR (RSV, FLU A&B, COVID)  RVPGX2  CULTURE, BLOOD (ROUTINE X 2)  CULTURE, BLOOD (ROUTINE X 2)  GROUP A STREP BY PCR  CBC  LACTIC ACID, PLASMA  LACTIC ACID, PLASMA  PROTIME-INR  APTT  URINALYSIS, W/ REFLEX TO CULTURE (INFECTION SUSPECTED)  HEPATIC FUNCTION PANEL  TROPONIN I (HIGH SENSITIVITY)    EKG None  Radiology DG Chest 2 View  Result Date: 01/17/2023 CLINICAL DATA:  Chest pain EXAM: CHEST - 2 VIEW COMPARISON:  07/11/2022 FINDINGS: Cardiac and mediastinal contours are within normal limits. No focal pulmonary opacity. No pleural effusion or pneumothorax. No acute osseous abnormality. IMPRESSION: No acute cardiopulmonary process. Electronically Signed   By: Merilyn Baba M.D.   On: 01/17/2023 18:12    Procedures Procedures    Medications Ordered in ED Medications  lactated ringers infusion (has no administration in time range)  lactated ringers bolus 1,000 mL (has no administration in time range)    And  lactated ringers bolus 1,000 mL (has no administration in time range)    And  lactated ringers bolus 1,000 mL (has no administration in time range)  cefTRIAXone (ROCEPHIN) 2 g in sodium chloride 0.9 % 100 mL IVPB (has no administration in time range)  metroNIDAZOLE (FLAGYL) IVPB 500 mg (has no administration in time  range)  acetaminophen (TYLENOL) tablet 650 mg (650 mg Oral Given 01/17/23 1751)    ED Course/ Medical Decision Making/ A&P                             Medical Decision Making Patient here for evaluation of fever, chills, left chest pain and lower abdominal pain.  Subjective fever at home of 104.  He is 102 here.  Had anoscopy yesterday with biopsy of rectal lesion.  Began complaining of generalized malaise after procedure with fever today.  Feels constipated.  Left-sided chest pain is squeezing in quality.  Denies any radiating pains, diaphoresis, or shortness of breath.  No vomiting or diarrhea.  High clinical concern for sepsis, sepsis protocol initiated.  IV fluids cultures of urine and blood chest x-ray.  Will obtain CT abdomen and pelvis for further evaluation.  Differential also would include fever secondary to viral process, strep pharyngitis, intestinal perforation.  No peritonitis on exam at this time.  Amount and/or Complexity of Data Reviewed Labs: ordered. Radiology: ordered.    Details: Chest x-ray without acute cardiopulmonary process. Discussion of management or test interpretation with external provider(s): Labs and CT abd/pelvis pending, discussed findings with Fatima Sanger, PA-C who agrees to review results and arrange dispo.  If work up reassuring, pt may be d/c home.    Risk OTC drugs. Prescription drug management.           Final Clinical Impression(s) / ED Diagnoses Final diagnoses:  None    Rx / DC Orders ED Discharge Orders     None         Bufford Lope 01/17/23 1934    Milton Ferguson, MD 01/18/23 1132

## 2023-01-20 ENCOUNTER — Emergency Department (HOSPITAL_COMMUNITY)
Admission: EM | Admit: 2023-01-20 | Discharge: 2023-01-20 | Disposition: A | Discharge status: Home / Self Care | Payer: No Typology Code available for payment source | Attending: Emergency Medicine | Admitting: Emergency Medicine

## 2023-01-20 ENCOUNTER — Other Ambulatory Visit: Payer: Self-pay

## 2023-01-20 ENCOUNTER — Encounter (HOSPITAL_COMMUNITY): Payer: Self-pay | Admitting: *Deleted

## 2023-01-20 DIAGNOSIS — J029 Acute pharyngitis, unspecified: Secondary | ICD-10-CM | POA: Diagnosis not present

## 2023-01-20 DIAGNOSIS — R509 Fever, unspecified: Secondary | ICD-10-CM | POA: Diagnosis present

## 2023-01-20 MED ORDER — LIDOCAINE VISCOUS HCL 2 % MT SOLN: 15.0000 mL | OROMUCOSAL | 0 refills | Status: DC | PRN

## 2023-01-20 MED ORDER — LIDOCAINE VISCOUS HCL 2 % MT SOLN
15.0000 mL | Freq: Once | OROMUCOSAL | Status: AC
  Administered 2023-01-20: 15 mL via OROMUCOSAL
  Filled 2023-01-20: qty 15

## 2023-01-20 NOTE — ED Triage Notes (Signed)
Pt here on 3/27 and dx'd with strep throat.  Pt states still running a fever and continued sore throat.

## 2023-01-20 NOTE — Discharge Instructions (Signed)
Evaluation today was overall reassuring.  You likely still have symptoms from your ongoing strep throat infection.  Recommend that you continue taking your penicillin.  Also sent viscous lidocaine to your pharmacy to treat your pain related to your throat.  If you have trouble swallowing, trouble breathing, drooling or any other concern please return emergency department further evaluation.  Otherwise recommend you follow-up with your PCP if your symptoms persist.

## 2023-01-21 NOTE — ED Provider Notes (Signed)
[facility] Provider Note   CSN: [id number] Arrival date & time: 01/20/23  1749     History  Chief Complaint  Patient presents with   Fever   HPI Jason Sheppard is a 42 y.o. male patient presenting for sore throat.  States he is also having a subjective fever at home.  Was diagnosed with strep throat on March 27.  Has been taking penicillin.  States he is compliant with the medication.  Not have any trouble swallowing, drooling or trouble breathing.  States other members of his family have also been diagnosed with strep throat recently.  Also taking Tylenol and ibuprofen for symptoms.   Fever Associated symptoms: sore throat        Home Medications Prior to Admission medications   Medication Sig Start Date End Date Taking? Authorizing Provider  lidocaine (XYLOCAINE) 2 % solution Use as directed 15 mLs in the mouth or throat as needed for mouth pain. 01/20/23  Yes Harriet Pho, PA-C  bictegravir-emtricitabine-tenofovir AF (BIKTARVY) 50-200-25 MG TABS tablet Take 1 tablet by mouth every morning.     [provider]  brompheniramine-pseudoephedrine-DM 30-2-10 MG/5ML syrup Take 5 mLs by mouth 4 (four) times daily as needed. 04/06/22   Leath-Warren, Alda Lea, NP  cetirizine-pseudoephedrine (ZYRTEC-D) 5-120 MG tablet Take 1 tablet by mouth 2 (two) times daily. 04/06/22   Leath-Warren, Alda Lea, NP  dicyclomine (BENTYL) 20 MG tablet Take 1 tablet (20 mg total) by mouth 2 (two) times daily. 10/05/22   Wilnette Kales, PA  docusate sodium (COLACE) 100 MG capsule Take 100 mg by mouth 2 (two) times daily.    [provider]  DULoxetine (CYMBALTA) 60 MG capsule Take 60 mg by mouth daily. 05/24/21   [provider]  fluticasone (FLONASE) 50 MCG/ACT nasal spray Place 2 sprays into both nostrils daily. 04/06/22   Leath-Warren, Alda Lea, NP  lidocaine (LIDODERM) 5 % Place 1 patch onto the skin daily. Remove & Discard  patch within 12 hours or as directed by MD 02/13/22   Maudie Flakes, MD  lisinopril (ZESTRIL) 10 MG tablet TAKE ONE TABLET BY MOUTH DAILY FOR BLOOD PRESSURE 06/20/22   [provider]  metFORMIN (GLUCOPHAGE) 500 MG tablet Take 500 mg by mouth 2 (two) times daily with a meal.    [provider]  methocarbamol (ROBAXIN) 500 MG tablet Take 1 tablet (500 mg total) by mouth every 8 (eight) hours as needed for muscle spasms. 02/13/22   Maudie Flakes, MD  montelukast (SINGULAIR) 10 MG tablet Take 1 tablet (10 mg total) by mouth every morning. 02/27/19   Aline August, MD  mupirocin ointment (BACTROBAN) 2 % Apply 1 Application topically 2 (two) times daily. Patient not taking: Reported on 07/11/2022 06/25/22   Leath-Warren, Alda Lea, NP  oxyCODONE (OXY IR/ROXICODONE) 5 MG immediate release tablet Take 5 mg by mouth 3 (three) times daily as needed for severe pain. 06/27/22   [provider]  penicillin v potassium (VEETID) 500 MG tablet Take 1 tablet (500 mg total) by mouth 3 (three) times daily. 01/17/23   Small, Brooke L, PA  prazosin (MINIPRESS) 2 MG capsule Take 2 mg by mouth at bedtime. 05/25/22   [provider]  predniSONE (DELTASONE) 20 MG tablet Take 2 tablets (40 mg total) by mouth daily. 11/16/21   Vanessa Kick, MD  rosuvastatin (CRESTOR) 40 MG tablet TAKE ONE-HALF TABLET BY MOUTH ONCE A DAY FOR CHOLESTEROL Patient not  taking: Reported on 07/11/2022 06/20/22   [provider]  traZODone (DESYREL) 50 MG tablet Take 50 mg by mouth at bedtime. 05/25/22   [provider]      Allergies    Sulfa antibiotics, Atorvastatin, Keppra [levetiracetam], Oxcarbazepine, Iodine, Latex, and Statins    Review of Systems   Review of Systems  Constitutional:  Positive for fever.  HENT:  Positive for sore throat.     Physical Exam   Vitals:   01/20/23 1753  BP: 139/86  Pulse: 88  Resp: 18  Temp: 99 F (37.2 C)  SpO2: 99%    CONSTITUTIONAL:  well-appearing,  NAD NEURO:  Alert and oriented x 3, CN 3-12 grossly intact EYES:  eyes equal and reactive ENT/NECK:  Supple, no stridor.  Uvula swollen but midline, no erythema or exudate.  No evidence of peritonsillar abscess. CARDIO: appears well-perfused  PULM:  No respiratory distress, CTAB GI/GU:  non-distended MSK/SPINE:  No gross deformities, no edema, moves all extremities  SKIN:  no rash, atraumatic   *Additional and/or pertinent findings included in MDM below    ED Results / Procedures / Treatments   Labs (all labs ordered are listed, but only abnormal results are displayed) Labs Reviewed - No data to display  EKG None  Radiology No results found.  Procedures Procedures    Medications Ordered in ED Medications  lidocaine (XYLOCAINE) 2 % viscous mouth solution 15 mL (15 mLs Mouth/Throat Given 01/20/23 1833)    ED Course/ Medical Decision Making/ A&P                             Medical Decision Making Risk Prescription drug management.   42 year old well-appearing male presenting for sore throat and fever.  Exam unremarkable but did reveal uvula swelling.  Overall he looks clinically well.  Treated his sore throat with viscous lidocaine.  Doubt peritonsillar abscess. Advised him to continue taking his penicillin as prescribed.  Discussed pertinent return precautions.  Advised to follow-up with PCP if symptoms persist.  Sent viscous lidocaine to his pharmacy.        Final Clinical Impression(s) / ED Diagnoses Final diagnoses:  Sore throat    Rx / DC Orders ED Discharge Orders          Ordered    lidocaine (XYLOCAINE) 2 % solution  As needed        01/20/23 1827              Harriet Pho, PA-C 01/21/23 [place], [place], DO 01/25/23 0131

## 2023-01-22 LAB — CULTURE, BLOOD (ROUTINE X 2)
Culture: NO GROWTH
Culture: NO GROWTH
Special Requests: ADEQUATE

## 2023-02-07 ENCOUNTER — Ambulatory Visit (INDEPENDENT_AMBULATORY_CARE_PROVIDER_SITE_OTHER): Payer: No Typology Code available for payment source | Admitting: Gastroenterology

## 2023-02-07 ENCOUNTER — Encounter: Payer: Self-pay | Admitting: Gastroenterology

## 2023-02-07 VITALS — BP 130/82 | HR 92 | Ht 68.0 in | Wt 185.0 lb

## 2023-02-07 DIAGNOSIS — R1031 Right lower quadrant pain: Secondary | ICD-10-CM | POA: Diagnosis not present

## 2023-02-07 DIAGNOSIS — K219 Gastro-esophageal reflux disease without esophagitis: Secondary | ICD-10-CM | POA: Diagnosis not present

## 2023-02-07 NOTE — Patient Instructions (Signed)
You have been scheduled for an endoscopy and colonoscopy. Please follow the written instructions given to you at your visit today. Please pick up your prep supplies at the pharmacy within the next 1-3 days. If you use inhalers (even only as needed), please bring them with you on the day of your procedure.  The [facility] providers would like to encourage you to use [facility] to communicate with providers for non-urgent requests or questions.  Due to long hold times on the telephone, sending your provider a message by [facility] may be a faster and more efficient way to get a response.  Please allow 48 business hours for a response.  Please remember that this is for non-urgent requests.   Due to recent changes in healthcare laws, you may see the results of your imaging and laboratory studies on MyChart before your provider has had a chance to review them.  We understand that in some cases there may be results that are confusing or concerning to you. Not all laboratory results come back in the same time frame and the provider may be waiting for multiple results in order to interpret others.  Please give [place] 48 hours in order for your provider to thoroughly review all the results before contacting the office for clarification of your results.   Thank you for choosing me and [facility].  Tiajuana Amass, MD

## 2023-02-07 NOTE — Progress Notes (Signed)
HPI : Jason Sheppard is a very pleasant 42 year old male with a history of PTSD, depression, GERD, HIV and anal dysplasia who is referred to [place] by his [place] gastroenterologist for upper and lower endoscopy.  He developed new, persistent RLQ pain starting around Dec 2023.  He initially thought it was appendicitis, but a CT in the ED was unremarkable.  Since the pain first started, it has continued to bother him on an episodic basis.  Pain not associated with any reliable triggers, not usually associated with eating or with bowel movements.  Pain is improved with Bentyl which he takes as needed. He has had irregular bowel movements for most of his life, typically alternating between constipation and diarrhea.  He denies blood in the stool.  His weight has been stable. He has no family history of colon cancer or IBD.  He has had heartburn and acid regurgitation for many years.  He used to take pantoprazole daily, but now only takes it as needed.  He made some dietary changes and sleeps with the head of his bed elevated, which helped his symptoms a lot.  He is a long time smoker and currently still smoking. No dysphagia, nausea or vomiting.  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  Never had cancer.  He has never had an EGD or colonoscopy.   Past Medical History:  Diagnosis Date   Allergy    Attention deficit disorder with hyperactivity    Depression    HIV (human immunodeficiency virus infection)    Infective pericarditis    Migraine    OSA (obstructive sleep apnea)    Plantar fascial fibromatosis    PTSD (post-traumatic stress disorder)    Seizure disorder      Past Surgical History:  Procedure Laterality Date   HAND SURGERY     shrapnel removal     Family History  Problem Relation Age of Onset   Diabetes Mother    Heart disease Mother    Hypertension Mother    Diabetes Father    Heart disease Father    Hyperlipidemia Father    Colon cancer  Neg Hx    Stomach cancer Neg Hx    Esophageal cancer Neg Hx    Social History   Tobacco Use   Smoking status: Every Day    Packs/day: 1.00    Years: 14.00    Additional pack years: 0.00    Total pack years: 14.00    Types: Cigarettes    Last attempt to quit: 10/23/2005    Years since quitting: 17.3   Smokeless tobacco: Never  Vaping Use   Vaping Use: Never used  Substance Use Topics   Alcohol use: No    Alcohol/week: 0.0 standard drinks of alcohol   Drug use: No   Current Outpatient Medications  Medication Sig Dispense Refill   bictegravir-emtricitabine-tenofovir AF (BIKTARVY) 50-200-25 MG TABS tablet Take 1 tablet by mouth every morning.      brompheniramine-pseudoephedrine-DM 30-2-10 MG/5ML syrup Take 5 mLs by mouth 4 (four) times daily as needed. 140 mL 0   cetirizine-pseudoephedrine (ZYRTEC-D) 5-120 MG tablet Take 1 tablet by mouth 2 (two) times daily. 30 tablet 0   dicyclomine (BENTYL) 20 MG tablet Take 1 tablet (20 mg total) by mouth 2 (two) times daily. 20 tablet 0   docusate sodium (COLACE) 100 MG capsule Take 100 mg by mouth 2 (two) times daily.     DULoxetine (CYMBALTA) 60 MG capsule  Take 60 mg by mouth daily.     empagliflozin (JARDIANCE) 25 MG TABS tablet TAKE ONE-HALF TABLET BY MOUTH DAILY FOR DIABETES     ezetimibe (ZETIA) 10 MG tablet TAKE ONE-HALF TABLET BY MOUTH DAILY FOR CHOLESTEROL     fluticasone (FLONASE) 50 MCG/ACT nasal spray Place 2 sprays into both nostrils daily. 16 g 0   lidocaine (LIDODERM) 5 % Place 1 patch onto the skin daily. Remove & Discard patch within 12 hours or as directed by MD 5 patch 0   lidocaine (XYLOCAINE) 2 % solution Use as directed 15 mLs in the mouth or throat as needed for mouth pain. 100 mL 0   lisinopril (ZESTRIL) 10 MG tablet TAKE ONE TABLET BY MOUTH DAILY FOR BLOOD PRESSURE     metFORMIN (GLUCOPHAGE) 500 MG tablet Take 500 mg by mouth 2 (two) times daily with a meal.     methocarbamol (ROBAXIN) 500 MG tablet Take 1 tablet (500 mg  total) by mouth every 8 (eight) hours as needed for muscle spasms. 30 tablet 0   montelukast (SINGULAIR) 10 MG tablet Take 1 tablet (10 mg total) by mouth every morning.     mupirocin ointment (BACTROBAN) 2 % Apply 1 Application topically 2 (two) times daily. 22 g 0   oxyCODONE (OXY IR/ROXICODONE) 5 MG immediate release tablet Take 5 mg by mouth 3 (three) times daily as needed for severe pain.     penicillin v potassium (VEETID) 500 MG tablet Take 1 tablet (500 mg total) by mouth 3 (three) times daily. 30 tablet 0   prazosin (MINIPRESS) 2 MG capsule Take 2 mg by mouth at bedtime.     predniSONE (DELTASONE) 20 MG tablet Take 2 tablets (40 mg total) by mouth daily. 10 tablet 0   traZODone (DESYREL) 50 MG tablet Take 50 mg by mouth at bedtime.     rosuvastatin (CRESTOR) 40 MG tablet TAKE ONE-HALF TABLET BY MOUTH ONCE A DAY FOR CHOLESTEROL (Patient not taking: Reported on 07/11/2022)     No current facility-administered medications for this visit.   Allergies  Allergen Reactions   Sulfa Antibiotics Anaphylaxis   Atorvastatin Diarrhea    Other reaction(s): Suicidal thoughts   Keppra [Levetiracetam] Other (See Comments)    "rage"   Oxcarbazepine Other (See Comments)    Other reaction(s): Liver enzymes abnormal   Iodine Rash   Latex Rash   Statins Anxiety     Review of Systems: All systems reviewed and negative except where noted in HPI.    CT ABDOMEN PELVIS W CONTRAST  Result Date: 01/17/2023 CLINICAL DATA:  Left lower quadrant abdominal pain and left chest pain. Anoscopy yesterday. EXAM: CT ABDOMEN AND PELVIS WITH CONTRAST TECHNIQUE: Multidetector CT imaging of the abdomen and pelvis was performed using the standard protocol following bolus administration of intravenous contrast. RADIATION DOSE REDUCTION: This exam was performed according to the departmental dose-optimization program which includes automated exposure control, adjustment of the mA and/or kV according to patient size and/or  use of iterative reconstruction technique. CONTRAST:  OMNIPAQUE IOHEXOL 300 MG/ML  SOLN COMPARISON:  10/24/2021 and 10/05/2022 FINDINGS: Lower chest: Atelectasis in the lung bases. Hepatobiliary: No focal liver abnormality is seen. No gallstones, gallbladder wall thickening, or biliary dilatation. Pancreas: Unremarkable. No pancreatic ductal dilatation or surrounding inflammatory changes. Spleen: Normal in size without focal abnormality. Adrenals/Urinary Tract: No adrenal gland nodules. Diffuse atrophy of the left kidney. Nephrograms are symmetrical. No hydronephrosis or hydroureter. Bladder is normal. Stomach/Bowel: Stomach, small bowel, and colon  are not abnormally distended. No wall thickening or inflammatory changes are appreciated. Appendix is not identified. Vascular/Lymphatic: Minimal scattered aortic calcification. No aneurysm. No significant lymphadenopathy. Reproductive: Prostate is unremarkable. Other: No free air or free fluid in the abdomen. Abdominal wall musculature appears intact. Musculoskeletal: Degenerative changes in the spine. IMPRESSION: 1. No acute process demonstrated in the abdomen or pelvis. No evidence of bowel obstruction or inflammation. 2. Chronic atrophy of the left kidney, unchanged. 3. Mild aortic atherosclerosis. Electronically Signed   By: Burman Nieves M.D.   On: 01/17/2023 19:21   DG Chest 2 View  Result Date: 01/17/2023 CLINICAL DATA:  Chest pain EXAM: CHEST - 2 VIEW COMPARISON:  07/11/2022 FINDINGS: Cardiac and mediastinal contours are within normal limits. No focal pulmonary opacity. No pleural effusion or pneumothorax. No acute osseous abnormality. IMPRESSION: No acute cardiopulmonary process. Electronically Signed   By: Wiliam Ke M.D.   On: 01/17/2023 18:12    Physical Exam: BP 130/82   Pulse 92   Ht 5\' 8"  (1.727 m)   Wt 185 lb (83.9 kg)   BMI 28.13 kg/m  Constitutional: Pleasant,well-developed, Caucasian male in no acute distress. HEENT:  Normocephalic and atraumatic. Conjunctivae are normal. No scleral icterus. Neck supple.  Cardiovascular: Normal rate, regular rhythm.  Pulmonary/chest: Effort normal and breath sounds normal. No wheezing, rales or rhonchi. Abdominal: Soft, nondistended, tenderness to palpation in the RLQ without rigidity or guarding.  Bowel sounds active throughout. There are no masses palpable. No hepatomegaly. Extremities: no edema Neurological: Alert and oriented to person place and time. Skin: Skin is warm and dry. No rashes noted. Psychiatric: Normal mood and affect. Behavior is normal.  CBC    Component Value Date/Time   WBC 9.1 01/17/2023 1748   RBC 4.91 01/17/2023 1748   HGB 15.0 01/17/2023 1748   HCT 42.3 01/17/2023 1748   PLT 258 01/17/2023 1748   MCV 86.2 01/17/2023 1748   MCH 30.5 01/17/2023 1748   MCHC 35.5 01/17/2023 1748   RDW 12.2 01/17/2023 1748   LYMPHSABS 2.3 09/29/2019 2047   MONOABS 1.0 09/29/2019 2047   EOSABS 0.4 09/29/2019 2047   BASOSABS 0.0 09/29/2019 2047    CMP     Component Value Date/Time   NA 134 (L) 01/17/2023 1748   K 3.9 01/17/2023 1748   CL 101 01/17/2023 1748   CO2 25 01/17/2023 1748   GLUCOSE 143 (H) 01/17/2023 1748   BUN 12 01/17/2023 1748   CREATININE 0.93 01/17/2023 1748   CREATININE 1.09 05/09/2017 1544   CALCIUM 8.7 (L) 01/17/2023 1748   PROT 7.7 01/17/2023 1748   ALBUMIN 4.3 01/17/2023 1748   AST 17 01/17/2023 1748   ALT 28 01/17/2023 1748   ALKPHOS 105 01/17/2023 1748   BILITOT 0.6 01/17/2023 1748   GFRNONAA >60 01/17/2023 1748   GFRNONAA >89 12/07/2015 0957   GFRAA >60 09/29/2019 2047   GFRAA >89 12/07/2015 0957     ASSESSMENT AND PLAN:  42 year old male with new onset persistent RLQ pain with normal CT and chronic GERD symptoms with a history of chronic tobacco use.  He was seen by GI at the [facility] recommended to have EGD and colonoscopy, but he says that they do not use MAC for their procedures, and was referred to [place] to have the  procedures done. He will follow up with his [place] gastroenterologist following the procedures. I suspect his abdominal pain is likely from IBS, but given new onset at age 27, colonoscopy is warranted.  EGD  reasonable for Barrett's screening given his chronic GERD symptoms and smoking history.  GERD - EGD for Barrett's - If esophagitis or Barrett's seen, patient will need to resume daily PPI  RLQ abdominal pain - Colonoscopy to exclude neoplasia/Crohn's  The details, risks (including bleeding, perforation, infection, missed lesions, medication reactions and possible hospitalization or surgery if complications occur), benefits, and alternatives to EGD/colonoscopy with possible biopsy and possible polypectomy were discussed with the patient and he consents to proceed.   Dyquan Minks E. Tomasa Rand, MD [facility], [place]

## 2023-02-15 ENCOUNTER — Encounter (HOSPITAL_COMMUNITY): Payer: Self-pay

## 2023-02-15 ENCOUNTER — Emergency Department (HOSPITAL_COMMUNITY)
Admission: EM | Admit: 2023-02-15 | Discharge: 2023-02-17 | Disposition: A | Discharge status: Other Acute Inpatient Hospital | Payer: No Typology Code available for payment source | Attending: Emergency Medicine | Admitting: Emergency Medicine

## 2023-02-15 ENCOUNTER — Other Ambulatory Visit: Payer: Self-pay

## 2023-02-15 DIAGNOSIS — F32A Depression, unspecified: Secondary | ICD-10-CM | POA: Insufficient documentation

## 2023-02-15 DIAGNOSIS — R45851 Suicidal ideations: Secondary | ICD-10-CM | POA: Insufficient documentation

## 2023-02-15 DIAGNOSIS — F29 Unspecified psychosis not due to a substance or known physiological condition: Secondary | ICD-10-CM | POA: Diagnosis not present

## 2023-02-15 DIAGNOSIS — Z91040 Latex allergy status: Secondary | ICD-10-CM | POA: Diagnosis not present

## 2023-02-15 DIAGNOSIS — R4589 Other symptoms and signs involving emotional state: Secondary | ICD-10-CM | POA: Diagnosis present

## 2023-02-15 DIAGNOSIS — F4310 Post-traumatic stress disorder, unspecified: Secondary | ICD-10-CM | POA: Insufficient documentation

## 2023-02-15 DIAGNOSIS — R519 Headache, unspecified: Secondary | ICD-10-CM | POA: Diagnosis not present

## 2023-02-15 DIAGNOSIS — Z20822 Contact with and (suspected) exposure to covid-19: Secondary | ICD-10-CM | POA: Diagnosis not present

## 2023-02-15 LAB — COMPREHENSIVE METABOLIC PANEL
ALT: 36 U/L (ref 0–44)
AST: 22 U/L (ref 15–41)
Albumin: 4.5 g/dL (ref 3.5–5.0)
Alkaline Phosphatase: 65 U/L (ref 38–126)
Anion gap: 9 (ref 5–15)
BUN: 15 mg/dL (ref 6–20)
CO2: 28 mmol/L (ref 22–32)
Calcium: 8.9 mg/dL (ref 8.9–10.3)
Chloride: 101 mmol/L (ref 98–111)
Creatinine, Ser: 1.03 mg/dL (ref 0.61–1.24)
GFR, Estimated: >60 mL/min (ref >60)
Glucose, Bld: 112 mg/dL — ABNORMAL HIGH (ref 70–99)
Potassium: 3.7 mmol/L (ref 3.5–5.1)
Sodium: 138 mmol/L (ref 135–145)
Total Bilirubin: 0.6 mg/dL (ref 0.3–1.2)
Total Protein: 8 g/dL (ref 6.5–8.1)

## 2023-02-15 LAB — ACETAMINOPHEN LEVEL: Acetaminophen (Tylenol), Serum: <10 ug/mL — ABNORMAL LOW (ref 10–30)

## 2023-02-15 LAB — CBC WITH DIFFERENTIAL/PLATELET
Abs Immature Granulocytes: 0.04 10*3/uL (ref 0.00–0.07)
Basophils Absolute: 0 10*3/uL (ref 0.0–0.1)
Basophils Relative: 0 %
Eosinophils Absolute: 0.2 10*3/uL (ref 0.0–0.5)
Eosinophils Relative: 2 %
HCT: 41.9 % (ref 39.0–52.0)
Hemoglobin: 14.6 g/dL (ref 13.0–17.0)
Immature Granulocytes: 0 %
Lymphocytes Relative: 25 %
Lymphs Abs: 2.8 10*3/uL (ref 0.7–4.0)
MCH: 30.4 pg (ref 26.0–34.0)
MCHC: 34.8 g/dL (ref 30.0–36.0)
MCV: 87.3 fL (ref 80.0–100.0)
Monocytes Absolute: 0.8 10*3/uL (ref 0.1–1.0)
Monocytes Relative: 7 %
Neutro Abs: 7.3 10*3/uL (ref 1.7–7.7)
Neutrophils Relative %: 66 %
Platelets: 270 10*3/uL (ref 150–400)
RBC: 4.8 MIL/uL (ref 4.22–5.81)
RDW: 12.7 % (ref 11.5–15.5)
WBC: 11.3 10*3/uL — ABNORMAL HIGH (ref 4.0–10.5)
nRBC: 0 % (ref 0.0–0.2)

## 2023-02-15 LAB — RAPID URINE DRUG SCREEN, HOSP PERFORMED
Amphetamines: NOT DETECTED
Barbiturates: NOT DETECTED
Benzodiazepines: NOT DETECTED
Cocaine: NOT DETECTED
Opiates: NOT DETECTED
Tetrahydrocannabinol: NOT DETECTED

## 2023-02-15 LAB — SALICYLATE LEVEL: Salicylate Lvl: <7 mg/dL — ABNORMAL LOW (ref 7.0–30.0)

## 2023-02-15 LAB — ETHANOL: Alcohol, Ethyl (B): <10 mg/dL (ref <10)

## 2023-02-15 MED ORDER — EZETIMIBE 10 MG PO TABS
10.0000 mg | ORAL_TABLET | Freq: Every day | ORAL | Status: DC
  Administered 2023-02-16 – 2023-02-17 (×2): 10 mg via ORAL
  Filled 2023-02-15 (×2): qty 1

## 2023-02-15 MED ORDER — BICTEGRAVIR-EMTRICITAB-TENOFOV 50-200-25 MG PO TABS
1.0000 | ORAL_TABLET | Freq: Every day | ORAL | Status: DC
  Administered 2023-02-15 – 2023-02-16 (×2): 1 via ORAL
  Filled 2023-02-15 (×4): qty 1

## 2023-02-15 MED ORDER — MONTELUKAST SODIUM 10 MG PO TABS
10.0000 mg | ORAL_TABLET | Freq: Every day | ORAL | Status: DC
  Administered 2023-02-16 – 2023-02-17 (×2): 10 mg via ORAL
  Filled 2023-02-15 (×2): qty 1

## 2023-02-15 MED ORDER — ACETAMINOPHEN 325 MG PO TABS
650.0000 mg | ORAL_TABLET | Freq: Once | ORAL | Status: AC
  Administered 2023-02-15: 650 mg via ORAL
  Filled 2023-02-15: qty 2

## 2023-02-15 MED ORDER — TRAZODONE HCL 50 MG PO TABS
50.0000 mg | ORAL_TABLET | Freq: Every day | ORAL | Status: DC
  Administered 2023-02-15 – 2023-02-16 (×2): 50 mg via ORAL
  Filled 2023-02-15 (×2): qty 1

## 2023-02-15 MED ORDER — PRAZOSIN HCL 1 MG PO CAPS
2.0000 mg | ORAL_CAPSULE | Freq: Every day | ORAL | Status: DC
  Administered 2023-02-15 – 2023-02-16 (×2): 2 mg via ORAL
  Filled 2023-02-15: qty 2
  Filled 2023-02-15 (×2): qty 1
  Filled 2023-02-15: qty 2
  Filled 2023-02-15 (×2): qty 1

## 2023-02-15 MED ORDER — LISINOPRIL 10 MG PO TABS
10.0000 mg | ORAL_TABLET | Freq: Every day | ORAL | Status: DC
  Administered 2023-02-17: 10 mg via ORAL
  Filled 2023-02-15: qty 1

## 2023-02-15 NOTE — ED Triage Notes (Signed)
Pt brought to ED by [facility] dept. With thought of suicidal ideation, pt says it started about 24 years ago, has hx PTSD and sees psychiatrist at [place]- pt reports having argument with spouse today and she says she is going to leave him and that is what triggered these thoughts today.

## 2023-02-15 NOTE — ED Notes (Signed)
Pt wanded in triage by [facility] with hospital security

## 2023-02-15 NOTE — ED Provider Notes (Signed)
[place] EMERGENCY DEPARTMENT AT [facility] Provider Note   CSN: [id number] Arrival date & time: 02/15/23  1821     History  Chief Complaint  Patient presents with   Suicidal    Jason Sheppard is a 42 y.o. male.  42 year old male with past medical history significant of PTSD, depression presents today for evaluation of suicidal ideations.  Jason Sheppard states these have been ongoing for the past 21 years.  Worse today.  Jason Sheppard states the attempted to put a gun under his chin 3 times tonight however did not follow through.  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  Denies hallucinations currently.  Jason Sheppard denies homicidal ideations.  Endorses mild headache.  No other complaints.  The history is provided by the patient. No language interpreter was used.       Home Medications Prior to Admission medications   Medication Sig Start Date End Date Taking? Authorizing Provider  bictegravir-emtricitabine-tenofovir AF (BIKTARVY) 50-200-25 MG TABS tablet Take 1 tablet by mouth every morning.     [provider]  brompheniramine-pseudoephedrine-DM 30-2-10 MG/5ML syrup Take 5 mLs by mouth 4 (four) times daily as needed. 04/06/22   Leath-Warren, Sadie Haber, NP  cetirizine-pseudoephedrine (ZYRTEC-D) 5-120 MG tablet Take 1 tablet by mouth 2 (two) times daily. 04/06/22   Leath-Warren, Sadie Haber, NP  dicyclomine (BENTYL) 20 MG tablet Take 1 tablet (20 mg total) by mouth 2 (two) times daily. 10/05/22   Peter Garter, PA  docusate sodium (COLACE) 100 MG capsule Take 100 mg by mouth 2 (two) times daily.    [provider]  DULoxetine (CYMBALTA) 60 MG capsule Take 60 mg by mouth daily. 05/24/21   [provider]  empagliflozin (JARDIANCE) 25 MG TABS tablet TAKE ONE-HALF TABLET BY MOUTH DAILY FOR DIABETES 08/24/22   [provider]  ezetimibe (ZETIA) 10 MG tablet TAKE ONE-HALF TABLET BY MOUTH DAILY FOR CHOLESTEROL 11/01/22    [provider]  fluticasone (FLONASE) 50 MCG/ACT nasal spray Place 2 sprays into both nostrils daily. 04/06/22   Leath-Warren, Sadie Haber, NP  lidocaine (LIDODERM) 5 % Place 1 patch onto the skin daily. Remove & Discard patch within 12 hours or as directed by MD 02/13/22   Sabas Sous, MD  lidocaine (XYLOCAINE) 2 % solution Use as directed 15 mLs in the mouth or throat as needed for mouth pain. 01/20/23   Gareth Eagle, PA-C  lisinopril (ZESTRIL) 10 MG tablet TAKE ONE TABLET BY MOUTH DAILY FOR BLOOD PRESSURE 06/20/22   [provider]  metFORMIN (GLUCOPHAGE) 500 MG tablet Take 500 mg by mouth 2 (two) times daily with a meal.    [provider]  methocarbamol (ROBAXIN) 500 MG tablet Take 1 tablet (500 mg total) by mouth every 8 (eight) hours as needed for muscle spasms. 02/13/22   Sabas Sous, MD  montelukast (SINGULAIR) 10 MG tablet Take 1 tablet (10 mg total) by mouth every morning. 02/27/19   Glade Lloyd, MD  mupirocin ointment (BACTROBAN) 2 % Apply 1 Application topically 2 (two) times daily. 06/25/22   Leath-Warren, Sadie Haber, NP  oxyCODONE (OXY IR/ROXICODONE) 5 MG immediate release tablet Take 5 mg by mouth 3 (three) times daily as needed for severe pain. 06/27/22   [provider]  penicillin v potassium (VEETID) 500 MG tablet Take 1 tablet (500 mg total) by mouth 3 (three) times daily. 01/17/23   Small, Brooke L, PA  prazosin (MINIPRESS)  2 MG capsule Take 2 mg by mouth at bedtime. 05/25/22   [provider]  predniSONE (DELTASONE) 20 MG tablet Take 2 tablets (40 mg total) by mouth daily. 11/16/21   Mardella Layman, MD  traZODone (DESYREL) 50 MG tablet Take 50 mg by mouth at bedtime. 05/25/22   [provider]      Allergies    Sulfa antibiotics, Atorvastatin, Keppra [levetiracetam], Oxcarbazepine, Iodine, Latex, and Statins    Review of Systems   Review of Systems  Constitutional:  Negative for chills and fever.  Psychiatric/Behavioral:   Positive for suicidal ideas. Negative for hallucinations.   All other systems reviewed and are negative.   Physical Exam Updated Vital Signs BP (!) 140/90   Pulse 74   Temp 98.3 F (36.8 C) (Oral)   Resp 18   Ht  (1.727 m)   Wt 84.8 kg   SpO2 100%   BMI 28.43 kg/m  Physical Exam Vitals and nursing note reviewed.  Constitutional:      General: Jason Sheppard is not in acute distress.    Appearance: Normal appearance. Jason Sheppard is not ill-appearing.  HENT:     Head: Normocephalic and atraumatic.     Nose: Nose normal.  Eyes:     General: No scleral icterus.    Extraocular Movements: Extraocular movements intact.     Conjunctiva/sclera: Conjunctivae normal.  Cardiovascular:     Rate and Rhythm: Normal rate and regular rhythm.     Pulses: Normal pulses.     Heart sounds: Normal heart sounds.  Pulmonary:     Effort: Pulmonary effort is normal. No respiratory distress.     Breath sounds: Normal breath sounds. No wheezing or rales.  Abdominal:     General: There is no distension.     Tenderness: There is no abdominal tenderness.  Musculoskeletal:        General: Normal range of motion.     Cervical back: Normal range of motion.  Skin:    General: Skin is warm and dry.  Neurological:     General: No focal deficit present.     Mental Status: Jason Sheppard is alert and oriented to person, place, and time. Mental status is at baseline.  Psychiatric:        Attention and Perception: Attention normal.        Mood and Affect: Mood is depressed.        Speech: Speech normal.        Behavior: Behavior is cooperative.        Thought Content: Thought content includes suicidal ideation. Thought content does not include homicidal ideation. Thought content includes suicidal plan. Thought content does not include homicidal plan.     ED Results / Procedures / Treatments   Labs (all labs ordered are listed, but only abnormal results are displayed) Labs Reviewed - No data to  display  EKG None  Radiology No results found.  Procedures Procedures    Medications Ordered in ED Medications  acetaminophen (TYLENOL) tablet 650 mg (has no administration in time range)    ED Course/ Medical Decision Making/ A&P                             Medical Decision Making Amount and/or Complexity of Data Reviewed Labs: ordered.  Risk OTC drugs. Prescription drug management.   42 year old male presents today for relation of suicidal ideations.  His plan is to shoot himself with a gun.  Jason Sheppard does have access to a gun.  Denies homicidal ideations.  Endorses occasional hallucinations but not currently.  Will obtain medical labs.  Provide Tylenol for headache.  Following labs will have psychiatry team evaluate patient.  CBC with leukocytosis but no left shift.  No other acute process.  CMP with glucose 112 otherwise unremarkable.  Acetaminophen, ethanol level, salicylate levels within normal limits.  UDS unremarkable.  EKG without acute ischemic changes.   Patient cleared for psych eval.  Home medications ordered.  Placed in psych hold.  Patient on reevaluation resting comfortably with his eyes closed.  Final Clinical Impression(s) / ED Diagnoses Final diagnoses:  Suicidal ideation    Rx / DC Orders ED Discharge Orders     None         Marita Kansas, PA-C 02/15/23 2249    Linwood Dibbles, MD 02/15/23 2329

## 2023-02-16 LAB — SARS CORONAVIRUS 2 BY RT PCR: SARS Coronavirus 2 by RT PCR: NEGATIVE

## 2023-02-16 LAB — RESP PANEL BY RT-PCR (RSV, FLU A&B, COVID)  RVPGX2
Influenza A by PCR: NEGATIVE
Influenza B by PCR: NEGATIVE
Resp Syncytial Virus by PCR: NEGATIVE
SARS Coronavirus 2 by RT PCR: NEGATIVE

## 2023-02-16 LAB — CBG MONITORING, ED: Glucose-Capillary: 188 mg/dL — ABNORMAL HIGH (ref 70–99)

## 2023-02-16 MED ORDER — DULOXETINE HCL 30 MG PO CPEP
30.0000 mg | ORAL_CAPSULE | Freq: Every day | ORAL | Status: DC
  Administered 2023-02-16: 30 mg via ORAL
  Filled 2023-02-16: qty 1

## 2023-02-16 MED ORDER — DULOXETINE HCL 30 MG PO CPEP
60.0000 mg | ORAL_CAPSULE | Freq: Every day | ORAL | Status: DC
  Administered 2023-02-16: 60 mg via ORAL
  Filled 2023-02-16: qty 2

## 2023-02-16 MED ORDER — DULOXETINE HCL 30 MG PO CPEP
60.0000 mg | ORAL_CAPSULE | Freq: Every day | ORAL | Status: DC
  Administered 2023-02-17: 60 mg via ORAL
  Filled 2023-02-16: qty 2

## 2023-02-16 NOTE — ED Notes (Signed)
Patient evaluated by TTS 

## 2023-02-16 NOTE — BH Assessment (Signed)
Comprehensive Clinical Assessment (CCA) Note  02/16/2023 SID GREENER [id number]  Disposition: Per Sindy Guadeloupe, NP inpatient treatment is recommended.  He is currently voluntary, however meets IVC criteria, should he ask to leave.  Disposition SW to pursue appropriate inpatient options with inpatient VA programs.   The patient demonstrates the following risk factors for suicide: Chronic risk factors for suicide include: psychiatric disorder of PTSD and demographic factors (male, >52 y/o). Acute risk factors for suicide include: family or marital conflict, social withdrawal/isolation, and loss (financial, interpersonal, professional). Protective factors for this patient include: positive social support, positive therapeutic relationship, responsibility to others (children, family), and hope for the future. Considering these factors, the overall suicide risk at this point appears to be high. Patient is not appropriate for outpatient follow up.  Patient is a 42 year old male with a history of PTSD who presents voluntarily via RSD to [facility].  Patient presents after he called the [facility] crisis line to report he was contemplating suicide.  Patient had left the house and had his gun loadad and held it under his chin three times when he decided to call the [place] crisis line.  Patient reported the triggers have been marital issues and loss.  He reports he and his wife got into an argument tonight and he began to feel hopeless when she told him she wanted out of the marriage.  Patient reports another trigger has been ongoing difficulty coping with losing friends from the service to suicide, most recently he lost a friend 1.5 yrs ago.   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  Patient denies HI, AVH or SA hx.  Patient lives with his wife and they have custody of 3  grandsons.  Patient has 3 grown sons who live in [place].  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  Patient states he has lost close friends(who served with him) to suicide.  Most recently he lost a friend 1.5 yrs ago.    Patient [facility] by the [facility] for medication management.  He is compliant with Rx cymbalta, prazosin and hydroxyzine.  He has been involved with IOP and other programs at the [place].  He has no past inpatient admissions.  Patient reports he has had intermittent SI for the pas 21 years, stating this is the first time he left the house with a weapon.  He has completed IOP at the [place] and states he was in the suicide prevention class for approximately a year.  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  He states that is " another issues in our marriage" as his wife is not a "dog person" and had him move his service dog outside.  Treatment options were discussed and inpatient has been recommended.  Patient is voluntary for inpatient treatment.  He meets IVC criteria, should he decide against inpatient treatment.    Chief Complaint:  Chief Complaint  Patient presents with   Suicidal   Visit Diagnosis: PTSD    CCA Screening, Triage and Referral (STR)  Patient Reported Information How did you hear about [place]? Legal System  What Is the Reason for Your Visit/Call Today? Patient presents voluntarily via RSD after he called the [facility] crisis line to  report he was contemplating suicide.  Patient had left the house and had his gun loadad and held it under his chin three times when he decided to call the [place] crisis line.  Patient reported the triggers have been ongoing difficulty coping with losing friends from the service to suicide.  He states, "I get what they did and why."  He reports he and his wife got into an argument and he began to feel hopeless when she told him she wanted out  of the marriage.  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  Patient denies HI, AVH or SA hx.  How Long Has This Been Causing You Problems? > than 6 months  What Do You Feel Would Help You the Most Today? Treatment for Depression or other mood problem   Have You Recently Had Any Thoughts About Hurting Yourself? Yes  Are You Planning to Commit Suicide/Harm Yourself At This time? Yes (near attempt tonight - plan to shoot self, had loaded gun)   Flowsheet Row ED from 02/15/2023 in [facility] Emergency Department at [facility] ED from 01/20/2023 in [facility] Emergency Department at [facility] ED from 01/17/2023 in [facility] Emergency Department at [facility]  C-SSRS RISK CATEGORY High Risk No Risk No Risk       Have you Recently Had Thoughts About Hurting Someone Karolee Ohs? No  Are You Planning to Harm Someone at This Time? No  Explanation: N/A   Have You Used Any Alcohol or Drugs in the Past 24 Hours? No  What Did You Use and How Much? N/A   Do You Currently Have a Therapist/Psychiatrist? Yes  Name of Therapist/Psychiatrist: Name of Therapist/Psychiatrist: Patient is under the care of [facility] psychiatrist   Have You Been Recently Discharged From Any Office Practice or Programs? No  Explanation of Discharge From Practice/Program: N/A     CCA Screening Triage Referral Assessment Type of Contact: No data recorded Telemedicine Service Delivery:   Is this Initial or Reassessment?   Date Telepsych consult ordered in [facility]:    Time Telepsych consult ordered in [facility]:    Location of Assessment: [facility] Services  Provider Location: [facility] Assessment Services   Collateral Involvement: N/A   Does Patient Have a Automotive engineer Guardian? No data recorded Legal Guardian Contact Information: N/A  Copy of Legal Guardianship  Form: -- (N/A)  Legal Guardian Notified of Arrival: -- (N/A)  Legal Guardian Notified of Pending Discharge: -- (N/A)  If Minor and Not Living with Parent(s), Who has Custody? N/A  Is CPS involved or ever been involved? Never  Is APS involved or ever been involved? Never   Patient Determined To Be At Risk for Harm To Self or Others Based on Review of Patient Reported Information or Presenting Complaint? Yes, for Self-Harm  Method: -- (N/A, no HI)  Availability of Means: -- (N/A, no HI)  Intent: -- (N/A, no HI)  Notification Required: -- (N/A, no HI)  Additional Information for Danger to Others Potential: -- (N/A, no HI)  Additional Comments for Danger to Others Potential: N/A, no HI  Are There Guns or Other Weapons in Your Home? Yes  Types of Guns/Weapons: Patient reports the sheriff took his gun and took it to his wife to secure.  Are These Weapons Safely Secured?  No data recorded Who Could Verify You Are Able To Have These Secured: Sheriff, patient's wife  Do You Have any Outstanding Charges, Pending Court Dates, Parole/Probation? Denies  Contacted To Inform of Risk of Harm To Self or Others: Patent examiner; Family/Significant Other:    Does Patient Present under Involuntary Commitment? No    [place] of Residence: [place]   Patient Currently Receiving the Following Services: Medication Management   Determination of Need: Emergent (2 hours)   Options For Referral: Inpatient Hospitalization     CCA Biopsychosocial Patient Reported Schizophrenia/Schizoaffective Diagnosis in Past: No   Strengths: Patient reached out for help by calling the [facility] crisis line, he is open to inpatient treatment   Mental Health Symptoms Depression:   Change in energy/activity; Difficulty Concentrating; Hopelessness; Worthlessness; Sleep (too much or little)   Duration of Depressive symptoms:  Duration of Depressive Symptoms: Greater than two  weeks   Mania:   None   Anxiety:    Worrying; Tension; Sleep   Psychosis:   None   Duration of Psychotic symptoms:    Trauma:   Difficulty staying/falling asleep; Avoids reminders of event; Emotional numbing; Hypervigilance; Re-experience of traumatic event; Irritability/anger   Obsessions:   None   Compulsions:   None   Inattention:   N/A   Hyperactivity/Impulsivity:   N/A   Oppositional/Defiant Behaviors:   N/A   Emotional Irregularity:   Chronic feelings of emptiness; Intense/inappropriate anger; Mood lability; Potentially harmful impulsivity   Other Mood/Personality Symptoms:   PTSD from active duty and witnessing casualties    Mental Status Exam Appearance and self-care  Stature:   Average   Weight:   Average weight   Clothing:   Casual   Grooming:   Normal   Cosmetic use:   None   Posture/gait:   Normal   Motor activity:   Not Remarkable   Sensorium  Attention:   Normal   Concentration:   Normal   Orientation:   X5   Recall/memory:   Normal   Affect and Mood  Affect:   Flat   Mood:   Depressed; Hopeless   Relating  Eye contact:   Normal   Facial expression:   Depressed; Sad   Attitude toward examiner:   Cooperative   Thought and Language  Speech flow:  Clear and Coherent   Thought content:   Appropriate to Mood and Circumstances   Preoccupation:   None   Hallucinations:   None   Organization:   Intact   Company secretary of Knowledge:   Average   Intelligence:   Average   Abstraction:   Normal   Judgement:   Poor; Dangerous   Reality Testing:   Adequate   Insight:   Gaps   Decision Making:   Impulsive; Vacilates   Social Functioning  Social Maturity:   Responsible   Social Judgement:   Normal   Stress  Stressors:   Relationship; Illness; Grief/losses   Coping Ability:   Exhausted   Skill Deficits:   Communication; Interpersonal; Decision making; Self-control    Supports:   Family     Religion: Religion/Spirituality Are You A Religious Person?: No How Might This Affect Treatment?: N/A  Leisure/Recreation: Leisure / Recreation Do You Have Hobbies?: No  Exercise/Diet: Exercise/Diet Do You Exercise?: No Have You Gained or Lost A Significant Amount of Weight in the Past Six Months?: No Do You Follow a Special Diet?: No Do You Have Any Trouble Sleeping?: Yes Explanation of Sleeping Difficulties:  Patient states he hasn't slept in 2 days.   CCA Employment/Education Employment/Work Situation: Employment / Work Systems developer: On disability Why is Patient on Disability: Injuries while in service, including TBI How Long has Patient Been on Disability: unknown Has Patient ever Been in the 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?: No  Education: Education Is Patient Currently Attending School?: No Last Grade Completed: 12 Did You Product manager?: No Did You Have An Individualized Education Program (IIEP): No Did You Have Any Difficulty At School?: No Patient's Education Has Been Impacted by Current Illness: No   CCA Family/Childhood History Family and Relationship History: Family history Marital status: Married Number of Years Married:  (NA) What types of issues is patient dealing with in the relationship?: Patient's wife has been supportive of him getting treatment for PTSD, however she is now stating she is going to leave him following argument tonight. Additional relationship information: NA Does patient have children?: Yes How many children?: 3 How is patient's relationship with their children?: Patient states he and his wife raise 3 grandchildren.  He has 3 grown sons in [place].  No reported issues.  Childhood History:  Childhood History By whom was/is the patient raised?: Both parents Did patient  suffer any verbal/emotional/physical/sexual abuse as a child?: No Did patient suffer from severe childhood neglect?: No Has patient ever been sexually abused/assaulted/raped as an adolescent or adult?: No Was the patient ever a victim of a crime or a disaster?: Yes Patient description of being a victim of a crime or disaster: Active duty, combat - lost friends while in service Witnessed domestic violence?: No Has patient been affected by domestic violence as an adult?: No       CCA Substance Use Alcohol/Drug Use: Alcohol / Drug Use Pain Medications: See MAR Prescriptions: See MAR Over the Counter: See MAR History of alcohol / drug use?: No history of alcohol / drug abuse                         ASAM's:  Six Dimensions of Multidimensional Assessment  Dimension 1:  Acute Intoxication and/or Withdrawal Potential:      Dimension 2:  Biomedical Conditions and Complications:      Dimension 3:  Emotional, Behavioral, or Cognitive Conditions and Complications:     Dimension 4:  Readiness to Change:     Dimension 5:  Relapse, Continued use, or Continued Problem Potential:     Dimension 6:  Recovery/Living Environment:     ASAM Severity Score:    ASAM Recommended Level of Treatment:     Substance use Disorder (SUD)    Recommendations for Services/Supports/Treatments:    Discharge Disposition:    DSM5 Diagnoses: Patient Active Problem List   Diagnosis Date Noted   Pericarditis 03/24/2019   CAP (community acquired pneumonia) 02/25/2019   Unintentional weight loss 06/06/2016   HIV disease (HCC) 12/15/2015   Dyslipidemia 12/15/2015   Hyperglycemia 12/15/2015   Sleep apnea 12/15/2015   Former smoker 12/15/2015   Attention deficit disorder with hyperactivity 12/15/2015   PTSD (post-traumatic stress disorder) 12/15/2015     Referrals to Alternative Service(s): Referred to Alternative Service(s):   Place:   Date:   Time:    Referred to Alternative Service(s):    Place:   Date:   Time:    Referred to Alternative Service(s):   Place:   Date:  Time:    Referred to Alternative Service(s):   Place:   Date:   Time:     Fransico Meadow, [facility]

## 2023-02-16 NOTE — Progress Notes (Signed)
LCSW Progress Note  [id number]   Jason Sheppard  02/16/2023  4:12 PM    Inpatient Behavioral Health Placement  Pt meets inpatient behavioral health placement per Sindy Guadeloupe, NP. Pt has VA benefits and there will seek placement within [place] system first. At the start of 1st shift CSW contacted [facility] [phone] ext: [phone] care coordination to inquire about bed availability. CSW was advised to follow up after 1:00pm today due to PENDING discharges. CSW informed that CSW would follow up. CSW attempted to contact [facility] at 1:26pm and left a HIPAA complaint voicemail, requesting  phone call back. CSW called again at 3:47pm, and was not successful. At 4:06pm this CSW was successful with speaking with Vilinda Boehringer AOD, Marlene Bast who advised this CSW that there are no open beds for inpatient behavioral health placement. CSW sent referral to [facility], and confirmed pt's demographics with AOD, Mason.  CSW awaiting follow up phone call from AOD, Mason with [facility] who informed that pt is under review. CSW also sent referral to the following VA's for review.  Destination  Service Provider Address Phone Fax Patient Preferred  [facility]  [address] [place] [address] [phone] --  [facility]  [address], [place] [address] [phone] --  [facility]  [address], [place] [address] [phone] --  [facility] (after hours)  [address] [place] [address] [phone] --     Situation ongoing,  CSW will follow up.    Maryjean Ka, MSW, LCSWA 02/16/2023 4:12 PM

## 2023-02-16 NOTE — ED Notes (Signed)
Pt was given his meal tray and a diet pepsi

## 2023-02-16 NOTE — Progress Notes (Signed)
CSW sent COVID negative results via EPIC to [facility] afterhours per request of VA AOD. CSW awaiting resp panel and will send results when completed.   Maryjean Ka, MSW, [facility] 02/16/2023 10:49 PM

## 2023-02-16 NOTE — Progress Notes (Signed)
   02/16/23 0158  BHUC Triage Screening (Walk-ins at [facility] only)  How Did You Hear About [place]? Legal System  What Is the Reason for Your Visit/Call Today? Patient presents voluntarily via RSD after he called the [facility] crisis line to report he was contemplating suicide.  Patient had left the house and had his gun loadad and held it under his chin three times when he decided to call the [place] crisis line.  Patient reported the triggers have been ongoing difficulty coping with losing friends from the service to suicide.  He states, "I get what they did and why."  He reports he and his wife got into an argument and he began to feel hopeless when she told him she wanted out of the marriage.  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  Patient denies HI, AVH or SA hx.  How Long Has This Been Causing You Problems? > than 6 months  Have You Recently Had Any Thoughts About Hurting Yourself? Yes  How long ago did you have thoughts about hurting yourself? Had a plan and intent to shoot himself tonight, had loaded gun under his chin.  Are You Planning to Commit Suicide/Harm Yourself At This time? Yes (near attempt tonight - plan to shoot self, had loaded gun)  Have you Recently Had Thoughts About Hurting Someone Karolee Ohs? No  Are You Planning To Harm Someone At This Time? No  Are you currently experiencing any auditory, visual or other hallucinations? No  Have You Used Any Alcohol or Drugs in the Past 24 Hours? No  Do you have any current medical co-morbidities that require immediate attention? 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  Clinician description of patient physical appearance/behavior: Patient appears distressed, flat affect, AAOx5  What Do You Feel Would Help You the Most Today? Treatment for Depression or  other mood problem  If access to [facility] was not available, would you have sought care in the Emergency Department? Yes  Determination of Need Emergent (2 hours)  Options For Referral Inpatient Hospitalization

## 2023-02-16 NOTE — Progress Notes (Signed)
   02/16/23 0158  BHUC Triage Screening (Walk-ins at [facility] only)  How Did You Hear About [place]? Legal System  What Is the Reason for Your Visit/Call Today? Patient presents via RSD under IVC, after he called the [facility] crisis line to report he was contemplating suicide.  Patient had left the house and had his gun loadad and under his chin when he called the [facility] crisis line.  Patient reported the triggers have been ongoing difficulty coping with losing friends from the service to suicide.  He states, "I get what they did and why."  He reports he and his wife got into an argument and he began to feel hopeless when she told him she wanted out of the marriage.  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  Patient denies HI, AVH or SA hx.  How Long Has This Been Causing You Problems? > than 6 months  Have You Recently Had Any Thoughts About Hurting Yourself? Yes  How long ago did you have thoughts about hurting yourself? Had a plan and intent to shoot himself tonight, had loaded gun under his chin.  Are You Planning to Commit Suicide/Harm Yourself At This time? Yes (near attempt tonight - plan to shoot self, had loaded gun)  Have you Recently Had Thoughts About Hurting Someone Karolee Ohs? No  Are You Planning To Harm Someone At This Time? No  Are you currently experiencing any auditory, visual or other hallucinations? No  Have You Used Any Alcohol or Drugs in the Past 24 Hours? No  Do you have any current medical co-morbidities that require immediate attention? 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  Clinician description of patient physical appearance/behavior: Patient appears distressed, flat affect, AAOx5  What Do You Feel Would Help You the Most Today? Treatment for Depression or other mood problem  If access to  [facility] was not available, would you have sought care in the Emergency Department? Yes  Determination of Need Emergent (2 hours)  Options For Referral Inpatient Hospitalization

## 2023-02-16 NOTE — Progress Notes (Signed)
This CSW received a phone call from [facility] AOD, [employer] who informed that a COVID and flu will be needed for pt's referral to move forward with [facility].   -This CSW reviewed chart and saw that COVID had been completed but inquired to nursing Luana Shu, RN about flu. CSW will send results to [facility] when completed.  Care Team notified: Joaquin Courts, FNP, [place] Grose, RN, Haze Justin, RN, and [employer], RN  Maryjean Ka, MSW, [facility] 02/16/2023 10:06 PM

## 2023-02-16 NOTE — ED Provider Notes (Signed)
Emergency Medicine Observation Re-evaluation Note  Jason Sheppard is a 42 y.o. male, seen on rounds today.  Pt initially presented to the ED for complaints of Suicidal Currently, the patient is calm.  Physical Exam  BP 124/79 (BP Location: Right Arm)   Pulse 69   Temp 97.7 F (36.5 C)   Resp 20   Ht 1.727 m (5\' 8" )   Wt 84.8 kg   SpO2 92%   BMI 28.43 kg/m  Physical Exam General: Generally depressed Cardiac: Normal rate Lungs: Speaks in full sentences, no abnormal lung sounds Psych: Depressed and flat affect  ED Course / MDM  EKG:EKG Interpretation  Date/Time:  Thursday February 15 2023 20:36:10 EDT Ventricular Rate:  66 PR Interval:  154 QRS Duration: 110 QT Interval:  396 QTC Calculation: 415 R Axis:   83 Text Interpretation: Normal sinus rhythm Incomplete right bundle branch block Borderline ECG When compared with ECG of 05-Oct-2022 21:36, No significant change was found No significant change since last tracing Confirmed by Linwood Dibbles [phone]) on 02/15/2023 8:50:03 PM  I have reviewed the labs performed to date as well as medications administered while in observation.  Recent changes in the last 24 hours include awaiting for psychiatry bed availability, continues to be depressed, no decompensation.  Pending placement.  Plan  Current plan is for pending placement.    Eber Hong, MD 02/16/23 1040

## 2023-02-16 NOTE — ED Provider Notes (Cosign Needed Addendum)
Patient awaiting psychiatric placement. He has no acute complaints. He is medically cleared for placement. He has previously been taking duloxetine 60mg  in the morning and 30mg  at bedtime and tolerating this well. Medication not currently ordered and being requested by nursing staff. Do not anticipate placement over the weekend. Vital signs stable. Pt calm, cooperative. Placed order for this medication. Otherwise no further care provided to this patient at this time.    Richardson Dopp 02/16/23 2154    Tonette Lederer, PA-C 02/16/23 2154    Lonell Grandchild, MD 02/18/23 1159

## 2023-02-16 NOTE — ED Notes (Signed)
Pt has been very cooperative during my shift. Pt is very calm respectful, communicates with staff well, using the restroom without trying to leave the building and resting constantly.

## 2023-02-17 NOTE — Progress Notes (Addendum)
This CSW sent updated lab results from Flu testing. At this time all PENDING labs have been sent to [facility].   Final PENDING items for Admission: [facility] Form 10-[facility]9B. This CSW has sent [facility] Form 10-2649B to assigned nurse to assist.   Update, CSW will obtain assistance from psych team to complete [facility] Form 10-2649B with the assistance of Harris, FNP and onsite MD. CSW will continue to work on this transfer to [facility].  Care Team notified: Joaquin Courts, FNP, [place] Grose, RN, Haze Justin, RN, and Luana Shu, RN , Marylene Land, RN, Ysidro Evert, PA-C, Delila Pereyra, RN, Eliseo Gum, MD   Maryjean Ka, MSW, [facility] [place] 02/17/2023 9:43 AM

## 2023-02-17 NOTE — Discharge Instructions (Signed)
Transfer to [facility]

## 2023-02-17 NOTE — ED Notes (Signed)
Pt is being escorted by security and Tori NT to the [facility]. Misty Stanley

## 2023-02-17 NOTE — ED Notes (Signed)
Patient's wife is taking his belongings bag home except for his phone and wallet which will go with the patient.

## 2023-02-17 NOTE — Progress Notes (Signed)
At 12:06pm this CSW received a phone call from [facility] Dr. Yetta Barre [phone] who confirmed that pt would be accepted TODAY 02/17/23 after PENDING resp panel. CSW informed Vilinda Boehringer [place] that CSW will return during 1st shift at 8am and will follow up.   Care Team notified: Joaquin Courts, FNP, [place] Grose, RN, Haze Justin, RN, and [employer], RN    Maryjean Ka, MSW, [facility] 02/17/2023 12:18 AM

## 2023-02-17 NOTE — Progress Notes (Signed)
This CSW called and spoke with [facility] AOD, Sue Lush [phone] who informed that the PENDING information was received via fax. AOD, Sue Lush also informed CSW that she and the team is aware about this pt's acceptance. AOD, Sue Lush shared with CSW that she is leaving at 2:00pm and oncoming AOD will call this CSW back with full acceptance information. AOD, Sue Lush reported that the PENDING received information needs to be scanned into the [place] system and on -call medical provider will need to be contacted to finalize pt's admission chart. AOD, Sue Lush advised CSW if CSW does not receive a phone call by 4:00pm to call back.   CSW will update care team.   Care Team notified: Joaquin Courts, FNP, [place] Grose, RN, Haze Justin, RN, and Luana Shu, RN , Marylene Land, RN, Ysidro Evert, PA-C, Ginger Pruitt, RN  Maryjean Ka, MSW, [facility] 02/17/2023 2:07 PM

## 2023-02-17 NOTE — Progress Notes (Signed)
This CSW has faxed [phone] all requested PENDING items to [facility] completed: [facility] Transfer Information sheet and [facility] Form 10-2649B. This CSW will now call covering AOD to obtain accepting information.   Maryjean Ka, MSW, [facility] 02/17/2023 1:38 PM

## 2023-02-17 NOTE — ED Notes (Signed)
Pt has been calm and cooperative this morning. Pt ate breakfast, ambulated to and from bathroom w/o difficulty, and changed bed linen.

## 2023-02-17 NOTE — Progress Notes (Signed)
This CSW spoke with [place] AOD, [place] [phone] at the following times: 4:13pm, and 4:49pm and was informed that the medical director was processing pt's in their ED, and would get to this pt next to complete the finial admission for pt's chart. This CSW was not provided finial acceptance information, but was told that CSW would receive a call back once the medical director has complete the final admission piece. CSW informed AOD Capricia that this CSW shift has ended but would accept phone call if they call with accepting information. CSW also obtained Lennie Odor charge RN's phone number [phone] (Current Charge RN and Estate manager/land agent)  Care Team notified: Joaquin Courts, FNP, [place] Grose, RN, Haze Justin, RN, and [employer], RN , Marylene Land, RN, Ysidro Evert, PA-C, Ginger Pruitt, RN      Maryjean Ka, MSW, [facility] 02/17/2023 5:41 PM

## 2023-02-17 NOTE — Progress Notes (Signed)
This CSW received a phone call from Dr. Eduardo Osier who advised that she was completing admission orders and to call AOD for accepting information. This CSW verbalized understanding and informed CSW would call AOD, Capricia.   Pt was accepted to [facility] TODAY  02/17/2023  Pt meets inpatient criteria per Sindy Guadeloupe, NP  Attending Physician will be Dr. Eduardo Osier  Report can be called to: - [phone] Ext [phone]  Pt can arrive after: BED IS READY NOW  Care Team notified: Joaquin Courts, FNP, [place] Grose, RN, Haze Justin, RN, and Luana Shu, RN , Marylene Land, RN, Ysidro Evert, PA-C   [place], LCSWA 02/17/2023 @ 7:03 PM

## 2023-02-17 NOTE — ED Notes (Signed)
Patient went with sitter and security to the garden for fresh air.

## 2023-02-17 NOTE — ED Notes (Signed)
This nurse called [facility] and spoke with AOD Capricia, who reports she has notified the Dr several times regarding signing off to accept pt, however has not heard anything back yet, reports she will notify again, this nurse requests to let Dr know that we provide transportation and to make the Dr aware of this, Capricia says she will do that. This Consulting civil engineer gave [place], SW direct number to give to [place] so they can call with any questions and f/u on plan of care.

## 2023-03-06 ENCOUNTER — Ambulatory Visit
Admission: EM | Admit: 2023-03-06 | Discharge: 2023-03-06 | Disposition: A | Discharge status: Home / Self Care | Payer: No Typology Code available for payment source | Attending: Nurse Practitioner | Admitting: Nurse Practitioner

## 2023-03-06 ENCOUNTER — Ambulatory Visit (INDEPENDENT_AMBULATORY_CARE_PROVIDER_SITE_OTHER): Payer: No Typology Code available for payment source

## 2023-03-06 DIAGNOSIS — J069 Acute upper respiratory infection, unspecified: Secondary | ICD-10-CM

## 2023-03-06 DIAGNOSIS — R062 Wheezing: Secondary | ICD-10-CM

## 2023-03-06 DIAGNOSIS — J019 Acute sinusitis, unspecified: Secondary | ICD-10-CM

## 2023-03-06 DIAGNOSIS — B9689 Other specified bacterial agents as the cause of diseases classified elsewhere: Secondary | ICD-10-CM

## 2023-03-06 MED ORDER — BENZONATATE 100 MG PO CAPS: 100.0000 mg | ORAL_CAPSULE | Freq: Three times a day (TID) | ORAL | 0 refills | Status: DC | PRN

## 2023-03-06 MED ORDER — AMOXICILLIN-POT CLAVULANATE 875-125 MG PO TABS: 1.0000 | ORAL_TABLET | Freq: Two times a day (BID) | ORAL | 0 refills | Status: AC

## 2023-03-06 MED ORDER — ALBUTEROL SULFATE (2.5 MG/3ML) 0.083% IN NEBU
2.5000 mg | INHALATION_SOLUTION | Freq: Once | RESPIRATORY_TRACT | Status: AC
  Administered 2023-03-06: 2.5 mg via RESPIRATORY_TRACT

## 2023-03-06 MED ORDER — ALBUTEROL SULFATE HFA 108 (90 BASE) MCG/ACT IN AERS: 1.0000 | INHALATION_SPRAY | Freq: Four times a day (QID) | RESPIRATORY_TRACT | 0 refills | Status: DC | PRN

## 2023-03-06 NOTE — Discharge Instructions (Addendum)
You have a sinus infection.  Take the Augmentin as prescribed to treat it.  Symptoms should improve over the next week to 10 days.  We gave you a breathing treatment today which helped open up your airways.  Please continue to use albuterol inhaler every 4-6 hours as needed for wheezing or shortness of breath.  Chest x-ray today is negative for acute cardiopulmonary abnormality.  Some things that can make you feel better are: - Increased rest - Increasing fluid with water/sugar free electrolytes - Acetaminophen and ibuprofen as needed for fever/pain - Salt water gargling, chloraseptic spray and throat lozenges - OTC guaifenesin (Mucinex) 600 mg twice daily for congestion - Saline sinus flushes or a neti pot - Humidifying the air -Tessalon Perles during the day as needed for dry cough

## 2023-03-06 NOTE — ED Provider Notes (Signed)
[facility]    CSN: [id number] Arrival date & time: 03/06/23  0806      History   Chief Complaint No chief complaint on file.   HPI Jason Sheppard is a 42 y.o. male.   Patient presents today for 5-day history of fever Tmax 102 last night, body aches and chills, congested cough, wheezing, stuffy and runny nose, sinus pressure above his eyebrows, headache, left-sided ear pain without drainage, decreased appetite, and fatigue.  He denies shortness of breath or chest pain, abdominal pain, nausea, diarrhea.  Reports Sunday night, he did have 1 episode of vomiting but thinks this may have been acid reflux.  Patient has been taking Mucinex and ibuprofen with minimal improvement.  Reports at home COVID-19 test last night was negative.  Patient denies history of asthma or COPD.  Medical history significant for pericarditis, community-acquired pneumonia, former smoker, and PTSD.  He is a Sales executive.  During triage, he admits to nurse that he is having suicidal ideation.  However upon further questioning, it sounds like he was recently admitted to the hospital for the same voluntarily and his medications are being adjusted and he has close follow-up with psychiatry through the [place].    Past Medical History:  Diagnosis Date   Allergy    Attention deficit disorder with hyperactivity    Depression    HIV (human immunodeficiency virus infection) (HCC)    Infective pericarditis    Migraine    OSA (obstructive sleep apnea)    Plantar fascial fibromatosis    PTSD (post-traumatic stress disorder)    Seizure disorder [facility])     Patient Active Problem List   Diagnosis Date Noted   Pericarditis 03/24/2019   CAP (community acquired pneumonia) 02/25/2019   Unintentional weight loss 06/06/2016   HIV disease (HCC) 12/15/2015   Dyslipidemia 12/15/2015   Hyperglycemia 12/15/2015   Sleep apnea 12/15/2015   Former smoker 12/15/2015   Attention deficit disorder with hyperactivity  12/15/2015   PTSD (post-traumatic stress disorder) 12/15/2015    Past Surgical History:  Procedure Laterality Date   HAND SURGERY     shrapnel removal         Home Medications    Prior to Admission medications   Medication Sig Start Date End Date Taking? Authorizing Provider  albuterol (VENTOLIN HFA) 108 (90 Base) MCG/ACT inhaler Inhale 1-2 puffs into the lungs every 6 (six) hours as needed for wheezing or shortness of breath. 03/06/23  Yes Valentino Nose, NP  amoxicillin-clavulanate (AUGMENTIN) 875-125 MG tablet Take 1 tablet by mouth 2 (two) times daily for 7 days. 03/06/23 03/13/23 Yes Valentino Nose, NP  benzonatate (TESSALON) 100 MG capsule Take 1 capsule (100 mg total) by mouth 3 (three) times daily as needed for cough. Do not take with alcohol or while driving or operating heavy machinery.  May cause drowsiness. 03/06/23  Yes Valentino Nose, NP  dicyclomine (BENTYL) 20 MG tablet Take 1 tablet (20 mg total) by mouth 2 (two) times daily. Patient taking differently: Take 10 mg by mouth 3 (three) times daily as needed for spasms. 10/05/22   Peter Garter, PA  DULoxetine (CYMBALTA) 60 MG capsule Take 60 mg by mouth daily. 05/24/21   [provider]  empagliflozin (JARDIANCE) 25 MG TABS tablet TAKE ONE-HALF TABLET BY MOUTH DAILY FOR DIABETES 08/24/22   [provider]  ezetimibe (ZETIA) 10 MG tablet TAKE ONE-HALF TABLET BY MOUTH DAILY FOR CHOLESTEROL 11/01/22   [provider]  fluticasone (  FLONASE) 50 MCG/ACT nasal spray Place 2 sprays into both nostrils daily. Patient not taking: Reported on 02/16/2023 04/06/22   Leath-Warren, Sadie Haber, NP  lisinopril (ZESTRIL) 10 MG tablet Take 20 mg by mouth daily. 06/20/22   [provider]  metFORMIN (GLUCOPHAGE) 500 MG tablet Take 500 mg by mouth 2 (two) times daily with a meal.    [provider]  methocarbamol (ROBAXIN) 500 MG tablet Take 1 tablet (500 mg total) by mouth every 8 (eight)  hours as needed for muscle spasms. 02/13/22   Sabas Sous, MD  montelukast (SINGULAIR) 10 MG tablet Take 1 tablet (10 mg total) by mouth every morning. 02/27/19   Glade Lloyd, MD  mupirocin ointment (BACTROBAN) 2 % Apply 1 Application topically 2 (two) times daily. Patient not taking: Reported on 02/16/2023 06/25/22   Leath-Warren, Sadie Haber, NP  oxyCODONE (OXY IR/ROXICODONE) 5 MG immediate release tablet Take 5 mg by mouth 3 (three) times daily as needed for severe pain. Patient not taking: Reported on 02/16/2023 06/27/22   [provider]  prazosin (MINIPRESS) 2 MG capsule Take 4 mg by mouth at bedtime. 05/25/22   [provider]  traZODone (DESYREL) 50 MG tablet Take 50 mg by mouth at bedtime. 05/25/22   [provider]    Family History Family History  Problem Relation Age of Onset   Diabetes Mother    Heart disease Mother    Hypertension Mother    Diabetes Father    Heart disease Father    Hyperlipidemia Father    Colon cancer Neg Hx    Stomach cancer Neg Hx    Esophageal cancer Neg Hx     Social History Social History   Tobacco Use   Smoking status: Every Day    Packs/day: 1.00    Years: 14.00    Additional pack years: 0.00    Total pack years: 14.00    Types: Cigarettes    Last attempt to quit: 10/23/2005    Years since quitting: 17.3   Smokeless tobacco: Never  Vaping Use   Vaping Use: Never used  Substance Use Topics   Alcohol use: No    Alcohol/week: 0.0 standard drinks of alcohol   Drug use: No     Allergies   Sulfa antibiotics, Atorvastatin, Keppra [levetiracetam], Oxcarbazepine, Iodine, Latex, and Statins   Review of Systems Review of Systems Per HPI  Physical Exam Triage Vital Signs ED Triage Vitals  Enc Vitals Group     BP 03/06/23 0819 (!) 152/83     Pulse Rate 03/06/23 0819 83     Resp 03/06/23 0819 16     Temp 03/06/23 0819 98 F (36.7 C)     Temp Source 03/06/23 0819 Oral     SpO2 03/06/23 0819 97 %     Weight --       Height --      Head Circumference --      Peak Flow --      Pain Score 03/06/23 0823 0     Pain Loc --      Pain Edu? --      Excl. in GC? --    No data found.  Updated Vital Signs BP (!) 152/83 (BP Location: Right Arm)   Pulse 83   Temp 98 F (36.7 C) (Oral)   Resp 16   SpO2 97%   Visual Acuity Right Eye Distance:   Left Eye Distance:   Bilateral Distance:    Right  Eye Near:   Left Eye Near:    Bilateral Near:     Physical Exam Vitals and nursing note reviewed.  Constitutional:      General: He is not in acute distress.    Appearance: Normal appearance. He is not ill-appearing or toxic-appearing.  HENT:     Head: Normocephalic and atraumatic.     Right Ear: Tympanic membrane, ear canal and external ear normal.     Left Ear: Tympanic membrane, ear canal and external ear normal.     Nose: Congestion present. No rhinorrhea.     Right Sinus: Maxillary sinus tenderness present. No frontal sinus tenderness.     Left Sinus: Maxillary sinus tenderness present. No frontal sinus tenderness.     Mouth/Throat:     Mouth: Mucous membranes are moist.     Pharynx: Oropharynx is clear. No oropharyngeal exudate or posterior oropharyngeal erythema.  Eyes:     General: No scleral icterus.    Extraocular Movements: Extraocular movements intact.  Cardiovascular:     Rate and Rhythm: Normal rate and regular rhythm.  Pulmonary:     Effort: Pulmonary effort is normal. No respiratory distress.     Breath sounds: Decreased air movement present. Wheezing present. No rhonchi or rales.  Abdominal:     General: Abdomen is flat. Bowel sounds are normal. There is no distension.     Palpations: Abdomen is soft.  Musculoskeletal:     Cervical back: Normal range of motion and neck supple.  Lymphadenopathy:     Cervical: No cervical adenopathy.  Skin:    General: Skin is warm and dry.     Coloration: Skin is not jaundiced or pale.     Findings: No erythema or rash.  Neurological:      Mental Status: He is alert and oriented to person, place, and time.  Psychiatric:        Behavior: Behavior is cooperative.      UC Treatments / Results  Labs (all labs ordered are listed, but only abnormal results are displayed) Labs Reviewed - No data to display  EKG   Radiology DG Chest 2 View  Result Date: 03/06/2023 CLINICAL DATA:  Fever, cough EXAM: CHEST - 2 VIEW COMPARISON:  01/17/2023 FINDINGS: Cardiac size is within normal limits. There are no signs of pulmonary edema or focal pulmonary consolidation. There is no pleural effusion or pneumothorax. IMPRESSION: No active cardiopulmonary disease. Electronically Signed   By: Ernie Avena M.D.   On: 03/06/2023 09:20    Procedures Procedures (including critical care time)  Medications Ordered in UC Medications  albuterol (PROVENTIL) (2.5 MG/3ML) 0.083% nebulizer solution 2.5 mg (2.5 mg Nebulization Given 03/06/23 0905)    Initial Impression / Assessment and Plan / UC Course  I have reviewed the triage vital signs and the nursing notes.  Pertinent labs & imaging results that were available during my care of the patient were reviewed by me and considered in my medical decision making (see chart for details).   Patient is well-appearing, normotensive, afebrile, not tachycardic, not tachypneic, oxygenating well on room air.    1. Acute bacterial sinusitis Treat with Augmentin twice daily for 7 days Supportive care discussed with patient  2. Wheezing 3. Acute upper respiratory infection Albuterol breathing treatment given with improvement in air movement to bilateral lung fields No wheezing after breathing treatment Chest x-ray today is negative for acute cardiopulmonary process Start albuterol inhaler every 4-6 hours as needed for wheezing/shortness of breath Start [employer]  care for persistent or worsening symptoms despite treatment  The patient was given the opportunity to ask questions.  All  questions answered to their satisfaction.  The patient is in agreement to this plan.    Final Clinical Impressions(s) / UC Diagnoses   Final diagnoses:  Acute bacterial sinusitis  Wheezing  Acute upper respiratory infection     Discharge Instructions      You have a sinus infection.  Take the Augmentin as prescribed to treat it.  Symptoms should improve over the next week to 10 days.  We gave you a breathing treatment today which helped open up your airways.  Please continue to use albuterol inhaler every 4-6 hours as needed for wheezing or shortness of breath.  Chest x-ray today is negative for acute cardiopulmonary abnormality.  Some things that can make you feel better are: - Increased rest - Increasing fluid with water/sugar free electrolytes - Acetaminophen and ibuprofen as needed for fever/pain - Salt water gargling, chloraseptic spray and throat lozenges - OTC guaifenesin (Mucinex) 600 mg twice daily for congestion - Saline sinus flushes or a neti pot - Humidifying the air -Tessalon Perles during the day as needed for dry cough     ED Prescriptions     Medication Sig Dispense Auth. Provider   amoxicillin-clavulanate (AUGMENTIN) 875-125 MG tablet Take 1 tablet by mouth 2 (two) times daily for 7 days. 14 tablet Cathlean Marseilles A, NP   albuterol (VENTOLIN HFA) 108 (90 Base) MCG/ACT inhaler Inhale 1-2 puffs into the lungs every 6 (six) hours as needed for wheezing or shortness of breath. 18 g Cathlean Marseilles A, NP   benzonatate (TESSALON) 100 MG capsule Take 1 capsule (100 mg total) by mouth 3 (three) times daily as needed for cough. Do not take with alcohol or while driving or operating heavy machinery.  May cause drowsiness. 21 capsule Valentino Nose, NP      PDMP not reviewed this encounter.   Valentino Nose, NP 03/06/23 [phone]

## 2023-03-06 NOTE — ED Notes (Signed)
Pt states he has a plan in place if he decides to attempt to commit suicide. RN and Provider notified

## 2023-03-06 NOTE — ED Triage Notes (Addendum)
Pt c/o congestion and cough x 3 days started with a deep cough and sinus pressure, oughing up thick green mucus. Pt states he had a fever last night and treat with Ibuprofen and Mucinex DM. He says it did help some but didn't clear it all out. Pt took home COVID test and it was negative.

## 2023-03-15 ENCOUNTER — Encounter: Payer: No Typology Code available for payment source | Admitting: Gastroenterology

## 2023-03-27 ENCOUNTER — Encounter: Payer: No Typology Code available for payment source | Admitting: Gastroenterology

## 2023-05-21 ENCOUNTER — Ambulatory Visit (AMBULATORY_SURGERY_CENTER): Payer: No Typology Code available for payment source | Admitting: *Deleted

## 2023-05-21 VITALS — Ht 68.0 in | Wt 185.0 lb

## 2023-05-21 DIAGNOSIS — Z1211 Encounter for screening for malignant neoplasm of colon: Secondary | ICD-10-CM

## 2023-05-21 NOTE — Progress Notes (Signed)
Pt's name and DOB verified at the beginning of the pre-visit.  Pt denies any difficulty with ambulating,sitting, laying down or rolling side to side Gave both [facility] main # and MD on call # prior to instructions.  No egg or soy allergy known to patient  No issues known to pt with past sedation with any surgeries or procedures Pt denies having issues being intubated Pt has no issues moving head neck or swallowing No FH of Malignant Hyperthermia Pt is not on diet pills Pt is not on home 02  Pt is not on blood thinners  Pt denies issues with constipation  Pt has frequent issues with constipation RN instructed pt to use Miralax per bottles instructions a week before prep days. Pt states they will Pt is not on dialysis Pt denise any abnormal heart rhythms  Pt denies any upcoming cardiac testing Pt encouraged to use to use Singlecare or Goodrx to reduce cost  Patient's chart reviewed by Cathlyn Parsons CNRA prior to pre-visit and patient appropriate for the [facility].  Pre-visit completed and red dot placed by patient's name on their procedure day (on provider's schedule).  . Visit by phone Pt states weight is 185 LB Instructed pt why it is important to and  to call if they have any changes in health or new medications. Directed them to the # given and on instructions.   Pt states they will.  Instructions reviewed with pt and pt states understanding. Instructed to review again prior to procedure. Pt states they will.  Instructions sent by mail with coupon and by my chart

## 2023-05-30 ENCOUNTER — Encounter: Payer: No Typology Code available for payment source | Admitting: Gastroenterology

## 2023-07-10 ENCOUNTER — Ambulatory Visit
Admission: EM | Admit: 2023-07-10 | Discharge: 2023-07-10 | Disposition: A | Discharge status: Home / Self Care | Payer: No Typology Code available for payment source

## 2023-07-10 ENCOUNTER — Other Ambulatory Visit: Payer: Self-pay

## 2023-07-10 ENCOUNTER — Encounter: Payer: Self-pay | Admitting: Emergency Medicine

## 2023-07-10 DIAGNOSIS — Z1152 Encounter for screening for COVID-19: Secondary | ICD-10-CM | POA: Diagnosis not present

## 2023-07-10 DIAGNOSIS — R112 Nausea with vomiting, unspecified: Secondary | ICD-10-CM | POA: Diagnosis present

## 2023-07-10 DIAGNOSIS — R197 Diarrhea, unspecified: Secondary | ICD-10-CM | POA: Insufficient documentation

## 2023-07-10 MED ORDER — ONDANSETRON 4 MG PO TBDP: 4.0000 mg | ORAL_TABLET | Freq: Three times a day (TID) | ORAL | 0 refills | Status: AC | PRN

## 2023-07-10 NOTE — ED Triage Notes (Signed)
Pt reports emesis, diarrhea, intermittent abd cramps for last several days.

## 2023-07-10 NOTE — ED Provider Notes (Signed)
[facility]    CSN: [id number] Arrival date & time: 07/10/23  0804      History   Chief Complaint Chief Complaint  Patient presents with   Emesis    HPI IAM SENDER is a 42 y.o. male.   Patient presenting today with several day history of abdominal cramping, diarrhea, nausea, vomiting, fatigue.  Denies fever, chills, body aches, upper respiratory symptoms.  Tolerating fluids and crackers but otherwise not tolerating p.o. very well.  Multiple sick contacts in the home.  Trying his home Bentyl with minimal relief.    Past Medical History:  Diagnosis Date   Allergy    Arthritis    Attention deficit disorder with hyperactivity    Depression    Diabetes mellitus without complication (HCC)    HIV (human immunodeficiency virus infection) (HCC)    Infective pericarditis    Migraine    OSA (obstructive sleep apnea)    Plantar fascial fibromatosis    PTSD (post-traumatic stress disorder)    Seizure disorder (HCC)    Sleep apnea     Patient Active Problem List   Diagnosis Date Noted   Pericarditis 03/24/2019   CAP (community acquired pneumonia) 02/25/2019   Unintentional weight loss 06/06/2016   HIV disease (HCC) 12/15/2015   Dyslipidemia 12/15/2015   Hyperglycemia 12/15/2015   Sleep apnea 12/15/2015   Former smoker 12/15/2015   Attention deficit disorder with hyperactivity 12/15/2015   PTSD (post-traumatic stress disorder) 12/15/2015    Past Surgical History:  Procedure Laterality Date   HAND SURGERY Right    TENDON REPAIR   shrapnel removal         Home Medications    Prior to Admission medications   Medication Sig Start Date End Date Taking? Authorizing Provider  bictegravir-emtricitabine-tenofovir AF (BIKTARVY) 50-200-25 MG TABS tablet Take by mouth daily.   Yes [provider]  ondansetron (ZOFRAN-ODT) 4 MG disintegrating tablet Take 1 tablet (4 mg total) by mouth every 8 (eight) hours as needed for nausea or vomiting. 07/10/23   Yes Particia Nearing, PA-C  albuterol (VENTOLIN HFA) 108 (90 Base) MCG/ACT inhaler Inhale 1-2 puffs into the lungs every 6 (six) hours as needed for wheezing or shortness of breath. 03/06/23   Valentino Nose, NP  benzonatate (TESSALON) 100 MG capsule Take 1 capsule (100 mg total) by mouth 3 (three) times daily as needed for cough. Do not take with alcohol or while driving or operating heavy machinery.  May cause drowsiness. Patient not taking: Reported on 05/21/2023 03/06/23   Cathlean Marseilles A, NP  diclofenac Sodium (VOLTAREN) 1 % GEL Apply topically. 08/24/22   [provider]  dicyclomine (BENTYL) 20 MG tablet Take 1 tablet (20 mg total) by mouth 2 (two) times daily. Patient taking differently: Take 10 mg by mouth 3 (three) times daily as needed for spasms. 10/05/22   Peter Garter, PA  DULoxetine (CYMBALTA) 60 MG capsule Take 60 mg by mouth daily. 05/24/21   [provider]  empagliflozin (JARDIANCE) 25 MG TABS tablet TAKE ONE-HALF TABLET BY MOUTH DAILY FOR DIABETES Patient not taking: Reported on 07/10/2023 08/24/22   [provider]  ezetimibe (ZETIA) 10 MG tablet TAKE ONE-HALF TABLET BY MOUTH DAILY FOR CHOLESTEROL Patient not taking: Reported on 07/10/2023 11/01/22   [provider]  fluticasone (FLONASE) 50 MCG/ACT nasal spray Place 2 sprays into both nostrils daily. Patient not taking: Reported on 02/16/2023 04/06/22   Leath-Warren, Sadie Haber, NP  hydrOXYzine (ATARAX) 10 MG tablet  Take by mouth as needed. 04/23/23   [provider]  lisinopril (ZESTRIL) 10 MG tablet Take 20 mg by mouth daily. 06/20/22   [provider]  metFORMIN (GLUCOPHAGE) 500 MG tablet Take 500 mg by mouth 2 (two) times daily with a meal.    [provider]  methocarbamol (ROBAXIN) 500 MG tablet Take 1 tablet (500 mg total) by mouth every 8 (eight) hours as needed for muscle spasms. 02/13/22   Sabas Sous, MD  montelukast (SINGULAIR) 10 MG tablet Take  1 tablet (10 mg total) by mouth every morning. 02/27/19   Glade Lloyd, MD  mupirocin ointment (BACTROBAN) 2 % Apply 1 Application topically 2 (two) times daily. 06/25/22   Leath-Warren, Sadie Haber, NP  naproxen (NAPROSYN) 500 MG tablet Take by mouth. 08/31/22   [provider]  oxyCODONE (OXY IR/ROXICODONE) 5 MG immediate release tablet Take 5 mg by mouth 3 (three) times daily as needed for severe pain. 06/27/22   [provider]  prazosin (MINIPRESS) 2 MG capsule Take 4 mg by mouth at bedtime. 05/25/22   [provider]  traZODone (DESYREL) 50 MG tablet Take 50 mg by mouth at bedtime. Patient not taking: Reported on 05/21/2023 05/25/22   [provider]    Family History Family History  Problem Relation Age of Onset   Diabetes Mother    Heart disease Mother    Hypertension Mother    Diabetes Father    Heart disease Father    Hyperlipidemia Father    Colon cancer Neg Hx    Stomach cancer Neg Hx    Esophageal cancer Neg Hx    Colon polyps Neg Hx    Rectal cancer Neg Hx     Social History Social History   Tobacco Use   Smoking status: Every Day    Current packs/day: 0.00    Average packs/day: 1 pack/day for 14.0 years (14.0 ttl pk-yrs)    Types: Cigarettes    Start date: 10/24/1991    Last attempt to quit: 10/23/2005    Years since quitting: 17.7   Smokeless tobacco: Never  Vaping Use   Vaping status: Never Used  Substance Use Topics   Alcohol use: No    Alcohol/week: 0.0 standard drinks of alcohol   Drug use: No     Allergies   Sulfa antibiotics, Atorvastatin, Keppra [levetiracetam], Oxcarbazepine, Iodine, Latex, and Statins   Review of Systems Review of Systems Per HPI  Physical Exam Triage Vital Signs ED Triage Vitals  Encounter Vitals Group     BP 07/10/23 0824 120/75     Systolic BP Percentile --      Diastolic BP Percentile --      Pulse Rate 07/10/23 0824 76     Resp 07/10/23 0824 16     Temp 07/10/23 0824 98.1 F (36.7 C)      Temp Source 07/10/23 0824 Oral     SpO2 07/10/23 0824 96 %     Weight --      Height --      Head Circumference --      Peak Flow --      Pain Score 07/10/23 0827 2     Pain Loc --      Pain Education --      Exclude from Growth Chart --    No data found.  Updated Vital Signs BP 120/75 (BP Location: Right Arm)   Pulse 76   Temp 98.1 F (36.7 C) (Oral)  Resp 16   SpO2 96%   Visual Acuity Right Eye Distance:   Left Eye Distance:   Bilateral Distance:    Right Eye Near:   Left Eye Near:    Bilateral Near:     Physical Exam Vitals and nursing note reviewed.  Constitutional:      Appearance: Normal appearance.  HENT:     Head: Atraumatic.     Nose: Nose normal.     Mouth/Throat:     Mouth: Mucous membranes are moist.  Eyes:     Extraocular Movements: Extraocular movements intact.     Conjunctiva/sclera: Conjunctivae normal.  Cardiovascular:     Rate and Rhythm: Normal rate and regular rhythm.  Pulmonary:     Effort: Pulmonary effort is normal.     Breath sounds: Normal breath sounds.  Abdominal:     General: Bowel sounds are normal. There is no distension.     Palpations: Abdomen is soft.     Tenderness: There is abdominal tenderness. There is no right CVA tenderness, left CVA tenderness or guarding.     Comments: Very mild mid abdominal tenderness to palpation without distention or guarding  Musculoskeletal:        General: Normal range of motion.     Cervical back: Normal range of motion and neck supple.  Skin:    General: Skin is warm and dry.  Neurological:     General: No focal deficit present.     Mental Status: He is oriented to person, place, and time.  Psychiatric:        Mood and Affect: Mood normal.        Thought Content: Thought content normal.        Judgment: Judgment normal.      UC Treatments / Results  Labs (all labs ordered are listed, but only abnormal results are displayed) Labs Reviewed  SARS CORONAVIRUS 2 (TAT 6-24 HRS)     EKG   Radiology No results found.  Procedures Procedures (including critical care time)  Medications Ordered in UC Medications - No data to display  Initial Impression / Assessment and Plan / UC Course  I have reviewed the triage vital signs and the nursing notes.  Pertinent labs & imaging results that were available during my care of the patient were reviewed by me and considered in my medical decision making (see chart for details).     Vitals and exam very reassuring with no red flag findings today.  Suspicious for viral GI illness.  Treat with Zofran, brat diet, Imodium as needed.  Return for worsening symptoms.  COVID testing pending for rule out.  Final Clinical Impressions(s) / UC Diagnoses   Final diagnoses:  Nausea vomiting and diarrhea   Discharge Instructions   None    ED Prescriptions     Medication Sig Dispense Auth. Provider   ondansetron (ZOFRAN-ODT) 4 MG disintegrating tablet Take 1 tablet (4 mg total) by mouth every 8 (eight) hours as needed for nausea or vomiting. 20 tablet Particia Nearing, [place]      PDMP not reviewed this encounter.   Roosvelt Maser Eff[place], [place] 07/10/23 [phone]

## 2023-07-22 ENCOUNTER — Ambulatory Visit
Admission: EM | Admit: 2023-07-22 | Discharge: 2023-07-22 | Disposition: A | Discharge status: Home / Self Care | Payer: No Typology Code available for payment source | Attending: Internal Medicine | Admitting: Internal Medicine

## 2023-07-22 ENCOUNTER — Ambulatory Visit: Payer: No Typology Code available for payment source

## 2023-07-22 ENCOUNTER — Encounter: Payer: Self-pay | Admitting: Emergency Medicine

## 2023-07-22 DIAGNOSIS — J189 Pneumonia, unspecified organism: Secondary | ICD-10-CM | POA: Diagnosis not present

## 2023-07-22 DIAGNOSIS — R059 Cough, unspecified: Secondary | ICD-10-CM | POA: Diagnosis not present

## 2023-07-22 DIAGNOSIS — B349 Viral infection, unspecified: Secondary | ICD-10-CM

## 2023-07-22 DIAGNOSIS — Z1152 Encounter for screening for COVID-19: Secondary | ICD-10-CM | POA: Diagnosis not present

## 2023-07-22 DIAGNOSIS — R509 Fever, unspecified: Secondary | ICD-10-CM | POA: Insufficient documentation

## 2023-07-22 LAB — POCT INFLUENZA A/B
Influenza A, POC: NEGATIVE
Influenza B, POC: NEGATIVE

## 2023-07-22 MED ORDER — ALBUTEROL SULFATE HFA 108 (90 BASE) MCG/ACT IN AERS
1.0000 | INHALATION_SPRAY | Freq: Four times a day (QID) | RESPIRATORY_TRACT | 0 refills | Status: AC | PRN
Start: 2023-07-22 — End: ?

## 2023-07-22 MED ORDER — AZITHROMYCIN 250 MG PO TABS: 250.0000 mg | ORAL_TABLET | Freq: Every day | ORAL | 0 refills | Status: AC

## 2023-07-22 MED ORDER — ACETAMINOPHEN 325 MG PO TABS
975.0000 mg | ORAL_TABLET | Freq: Once | ORAL | Status: AC
  Administered 2023-07-22: 975 mg via ORAL

## 2023-07-22 MED ORDER — AMOXICILLIN-POT CLAVULANATE 875-125 MG PO TABS: 1.0000 | ORAL_TABLET | Freq: Two times a day (BID) | ORAL | 0 refills | Status: AC

## 2023-07-22 MED ORDER — BENZONATATE 100 MG PO CAPS: 100.0000 mg | ORAL_CAPSULE | Freq: Three times a day (TID) | ORAL | 0 refills | Status: AC

## 2023-07-22 NOTE — Discharge Instructions (Addendum)
You have pneumonia.  This is a bacterial infection of your lungs. Take Augmentin and azithromycin as prescribed. Take with food to avoid stomach upset. You may take Tessalon Perles every 8 hours as needed for cough. I have prescribed an albuterol inhaler for you to use every 4-6 hours as needed for cough, shortness of breath, and wheeze. You may use Tylenol and ibuprofen as needed for fever/chills. Please schedule an appointment with your primary care provider for follow-up in the next 3 to 4 days. If you continue to have fever and/or shortness of breath while on antibiotics and your symptoms are not improving, please return to urgent care.  If your symptoms are severe, please go to the nearest emergency room for further workup and evaluation. I hope you feel better!

## 2023-07-22 NOTE — ED Triage Notes (Signed)
Was seen on 9/17 for similar symptoms. Cough, body aches, diarrhea, chills, fever x 2 days.  Has been taking mucinex an ibuprofen for symptoms.

## 2023-07-22 NOTE — ED Provider Notes (Signed)
[facility]    CSN: [id number] Arrival date & time: 07/22/23  0802      History   Chief Complaint No chief complaint on file.   HPI Jason Sheppard is a 42 y.o. male.   Jason Sheppard is a 42 y.o. male presenting for chief complaint of cough, nasal congestion, diarrhea, generalized abdominal cramping, body aches, and fever/chills that started abruptly 2 days ago on July 20, 2023.  He has had a few episodes of nonbloody diarrhea over the last 24 hours.  No nausea or vomiting reported.  Patient was sick with similar symptoms on July 10, 2023 where he was seen at urgent care and received negative COVID-19 test/was diagnosed with viral illness.  States most of his symptoms fully improved then returned other than his cough which has been persistent for the last 10 to 12 days.  Cough is productive with yellow, brown, green sputum.  Fever returned 2 days ago, max temp at home 104 last night but responded well to ibuprofen.  Currently febrile at 102.4.  Current everyday cigarette smoker, denies other drug use.  Son is sick with similar symptoms.  History of HIV, diabetes, and infective pericarditis.  He denies chest pain but does report some shortness of breath associated with cough.  Blood sugars have been within normal range.  Taking ibuprofen and Mucinex with some relief of symptoms.     Past Medical History:  Diagnosis Date   Allergy    Arthritis    Attention deficit disorder with hyperactivity    Depression    Diabetes mellitus without complication (HCC)    HIV (human immunodeficiency virus infection) (HCC)    Infective pericarditis    Migraine    OSA (obstructive sleep apnea)    Plantar fascial fibromatosis    PTSD (post-traumatic stress disorder)    Seizure disorder (HCC)    Sleep apnea     Patient Active Problem List   Diagnosis Date Noted   Pericarditis 03/24/2019   CAP (community acquired pneumonia) 02/25/2019   Unintentional weight loss 06/06/2016    HIV disease (HCC) 12/15/2015   Dyslipidemia 12/15/2015   Hyperglycemia 12/15/2015   Sleep apnea 12/15/2015   Former smoker 12/15/2015   Attention deficit disorder with hyperactivity 12/15/2015   PTSD (post-traumatic stress disorder) 12/15/2015    Past Surgical History:  Procedure Laterality Date   HAND SURGERY Right    TENDON REPAIR   shrapnel removal         Home Medications    Prior to Admission medications   Medication Sig Start Date End Date Taking? Authorizing Provider  amoxicillin-clavulanate (AUGMENTIN) 875-125 MG tablet Take 1 tablet by mouth every 12 (twelve) hours. 07/22/23  Yes Carlisle Beers, FNP  azithromycin (ZITHROMAX) 250 MG tablet Take 1 tablet (250 mg total) by mouth daily. Take first 2 tablets together, then 1 every day until finished. 07/22/23  Yes Carlisle Beers, FNP  benzonatate (TESSALON) 100 MG capsule Take 1 capsule (100 mg total) by mouth every 8 (eight) hours. 07/22/23  Yes Carlisle Beers, FNP  albuterol (VENTOLIN HFA) 108 (90 Base) MCG/ACT inhaler Inhale 1-2 puffs into the lungs every 6 (six) hours as needed for wheezing or shortness of breath. 07/22/23   Carlisle Beers, FNP  bictegravir-emtricitabine-tenofovir AF (BIKTARVY) 50-200-25 MG TABS tablet Take by mouth daily.    [provider]  diclofenac Sodium (VOLTAREN) 1 % GEL Apply topically. 08/24/22   [provider]  dicyclomine (BENTYL) 20 MG tablet  Take 1 tablet (20 mg total) by mouth 2 (two) times daily. Patient taking differently: Take 10 mg by mouth 3 (three) times daily as needed for spasms. 10/05/22   Peter Garter, PA  DULoxetine (CYMBALTA) 60 MG capsule Take 60 mg by mouth daily. 05/24/21   [provider]  empagliflozin (JARDIANCE) 25 MG TABS tablet TAKE ONE-HALF TABLET BY MOUTH DAILY FOR DIABETES Patient not taking: Reported on 07/10/2023 08/24/22   [provider]  ezetimibe (ZETIA) 10 MG tablet TAKE ONE-HALF TABLET BY MOUTH DAILY  FOR CHOLESTEROL Patient not taking: Reported on 07/10/2023 11/01/22   [provider]  fluticasone (FLONASE) 50 MCG/ACT nasal spray Place 2 sprays into both nostrils daily. Patient not taking: Reported on 02/16/2023 04/06/22   Leath-Warren, Sadie Haber, NP  hydrOXYzine (ATARAX) 10 MG tablet Take by mouth as needed. 04/23/23   [provider]  lisinopril (ZESTRIL) 10 MG tablet Take 20 mg by mouth daily. 06/20/22   [provider]  metFORMIN (GLUCOPHAGE) 500 MG tablet Take 500 mg by mouth 2 (two) times daily with a meal.    [provider]  methocarbamol (ROBAXIN) 500 MG tablet Take 1 tablet (500 mg total) by mouth every 8 (eight) hours as needed for muscle spasms. 02/13/22   Sabas Sous, MD  montelukast (SINGULAIR) 10 MG tablet Take 1 tablet (10 mg total) by mouth every morning. 02/27/19   Glade Lloyd, MD  mupirocin ointment (BACTROBAN) 2 % Apply 1 Application topically 2 (two) times daily. 06/25/22   Leath-Warren, Sadie Haber, NP  naproxen (NAPROSYN) 500 MG tablet Take by mouth. 08/31/22   [provider]  ondansetron (ZOFRAN-ODT) 4 MG disintegrating tablet Take 1 tablet (4 mg total) by mouth every 8 (eight) hours as needed for nausea or vomiting. 07/10/23   Particia Nearing, PA-C  oxyCODONE (OXY IR/ROXICODONE) 5 MG immediate release tablet Take 5 mg by mouth 3 (three) times daily as needed for severe pain. 06/27/22   [provider]  prazosin (MINIPRESS) 2 MG capsule Take 4 mg by mouth at bedtime. 05/25/22   [provider]    Family History Family History  Problem Relation Age of Onset   Diabetes Mother    Heart disease Mother    Hypertension Mother    Diabetes Father    Heart disease Father    Hyperlipidemia Father    Colon cancer Neg Hx    Stomach cancer Neg Hx    Esophageal cancer Neg Hx    Colon polyps Neg Hx    Rectal cancer Neg Hx     Social History Social History   Tobacco Use   Smoking status: Every Day    Current  packs/day: 0.00    Average packs/day: 1 pack/day for 14.0 years (14.0 ttl pk-yrs)    Types: Cigarettes    Start date: 10/24/1991    Last attempt to quit: 10/23/2005    Years since quitting: 17.7   Smokeless tobacco: Never  Vaping Use   Vaping status: Never Used  Substance Use Topics   Alcohol use: No    Alcohol/week: 0.0 standard drinks of alcohol   Drug use: No     Allergies   Sulfa antibiotics, Atorvastatin, Keppra [levetiracetam], Oxcarbazepine, Iodine, Latex, and Statins   Review of Systems Review of Systems Per HPI  Physical Exam Triage Vital Signs ED Triage Vitals  Encounter Vitals Group     BP 07/22/23 0817 (!) 147/84     Systolic BP Percentile --  Diastolic BP Percentile --      Pulse Rate 07/22/23 0817 (!) 104     Resp 07/22/23 0817 20     Temp 07/22/23 0817 (!) 102.4 F (39.1 C)     Temp Source 07/22/23 0817 Oral     SpO2 07/22/23 0817 95 %     Weight --      Height --      Head Circumference --      Peak Flow --      Pain Score 07/22/23 0819 8     Pain Loc --      Pain Education --      Exclude from Growth Chart --    No data found.  Updated Vital Signs BP (!) 147/84 (BP Location: Right Arm)   Pulse (!) 103   Temp (!) 101 F (38.3 C) (Oral)   Resp 20   SpO2 94%   Visual Acuity Right Eye Distance:   Left Eye Distance:   Bilateral Distance:    Right Eye Near:   Left Eye Near:    Bilateral Near:     Physical Exam Vitals and nursing note reviewed.  Constitutional:      Appearance: He is ill-appearing. He is not toxic-appearing.  HENT:     Head: Normocephalic and atraumatic.     Right Ear: Hearing, tympanic membrane, ear canal and external ear normal.     Left Ear: Hearing, tympanic membrane, ear canal and external ear normal.     Nose: Congestion present.     Mouth/Throat:     Lips: Pink.     Mouth: Mucous membranes are moist. No injury.     Tongue: No lesions. Tongue does not deviate from midline.     Palate: No mass and lesions.      Pharynx: Oropharynx is clear. Uvula midline. Posterior oropharyngeal erythema present. No pharyngeal swelling, oropharyngeal exudate or uvula swelling.     Tonsils: No tonsillar exudate or tonsillar abscesses.  Eyes:     General: Lids are normal. Vision grossly intact. Gaze aligned appropriately.     Extraocular Movements: Extraocular movements intact.     Conjunctiva/sclera: Conjunctivae normal.  Cardiovascular:     Rate and Rhythm: Normal rate and regular rhythm.     Heart sounds: Normal heart sounds, S1 normal and S2 normal.  Pulmonary:     Effort: Pulmonary effort is normal. No respiratory distress.     Breath sounds: Normal breath sounds and air entry. No wheezing, rhonchi or rales.     Comments: Harsh cough observed in clinic.  Chest:     Chest wall: No tenderness.  Musculoskeletal:     Cervical back: Neck supple.  Lymphadenopathy:     Cervical: No cervical adenopathy.  Skin:    General: Skin is warm and dry.     Capillary Refill: Capillary refill takes less than 2 seconds.     Findings: No rash.  Neurological:     General: No focal deficit present.     Mental Status: He is alert and oriented to person, place, and time. Mental status is at baseline.     Cranial Nerves: No dysarthria or facial asymmetry.  Psychiatric:        Mood and Affect: Mood normal.        Speech: Speech normal.        Behavior: Behavior normal.        Thought Content: Thought content normal.        Judgment: Judgment normal.  UC Treatments / Results  Labs (all labs ordered are listed, but only abnormal results are displayed) Labs Reviewed  SARS CORONAVIRUS 2 (TAT 6-24 HRS)  POCT INFLUENZA A/B    EKG   Radiology DG Chest 2 View  Result Date: 07/22/2023 CLINICAL DATA:  Persistent cough and fever.  Immunosuppression. EXAM: CHEST - 2 VIEW COMPARISON:  Radiographs 03/06/2023 and 01/17/2023. Chest CT 09/29/2019. FINDINGS: The heart size and mediastinal contours are stable. There is  mild streaky opacity at the left lung base which is primarily within the lingula based on the lateral view. No consolidation demonstrated. The right lung is clear. There is no pleural effusion or pneumothorax. The bones appear unchanged. IMPRESSION: Streaky opacity at the left lung base which may reflect atelectasis or early infiltrate. No consolidation. Electronically Signed   By: Carey Bullocks M.D.   On: 07/22/2023 09:23    Procedures Procedures (including critical care time)  Medications Ordered in UC Medications  acetaminophen (TYLENOL) tablet 975 mg (975 mg Oral Given 07/22/23 0844)    Initial Impression / Assessment and Plan / UC Course  I have reviewed the triage vital signs and the nursing notes.  Pertinent labs & imaging results that were available during my care of the patient were reviewed by me and considered in my medical decision making (see chart for details).   1.  Community-acquired pneumonia of the left lower lobe of lung, fever Chest x-ray concerning for possible early infiltrate to the lower lobe of the left lung, therefore will go ahead and initiate treatment for CAP given high clinical suspicion for possible bacterial pneumonia. Augmentin and azithromycin sent to pharmacy to be taken as prescribed with food.  Tessalon Perles as needed for cough.  Albuterol inhaler every 4-6 hours as needed for cough, shortness of breath, and wheeze.  Patient does not currently meet curb 65 criteria for inpatient admission and may be treated for pneumonia outpatient.  History of HIV/immunosuppression posing higher risk for significant infection.  Discussed return precautions should symptoms fail to improve/should he remain febrile while on antibiotics over the next 24 to 48 hours.  Encouraged to follow-up with his PCP in the next 3 to 4 days to ensure improvement.  Influenza testing here is negative, COVID testing is pending. Staff will call patient if COVID is positive.  Counseled  patient on potential for adverse effects with medications prescribed/recommended today, strict ER and return-to-clinic precautions discussed, patient verbalized understanding.    Final Clinical Impressions(s) / UC Diagnoses   Final diagnoses:  Community acquired pneumonia of left lower lobe of lung  Fever, unspecified     Discharge Instructions      You have pneumonia.  This is a bacterial infection of your lungs. Take Augmentin and azithromycin as prescribed. Take with food to avoid stomach upset. You may take Tessalon Perles every 8 hours as needed for cough. I have prescribed an albuterol inhaler for you to use every 4-6 hours as needed for cough, shortness of breath, and wheeze. You may use Tylenol and ibuprofen as needed for fever/chills. Please schedule an appointment with your primary care provider for follow-up in the next 3 to 4 days. If you continue to have fever and/or shortness of breath while on antibiotics and your symptoms are not improving, please return to urgent care.  If your symptoms are severe, please go to the nearest emergency room for further workup and evaluation. I hope you feel better!     ED Prescriptions  Medication Sig Dispense Auth. Provider   amoxicillin-clavulanate (AUGMENTIN) 875-125 MG tablet Take 1 tablet by mouth every 12 (twelve) hours. 14 tablet Carlisle Beers, FNP   azithromycin (ZITHROMAX) 250 MG tablet Take 1 tablet (250 mg total) by mouth daily. Take first 2 tablets together, then 1 every day until finished. 6 tablet Reita May M, FNP   benzonatate (TESSALON) 100 MG capsule Take 1 capsule (100 mg total) by mouth every 8 (eight) hours. 21 capsule Reita May M, FNP   albuterol (VENTOLIN HFA) 108 (90 Base) MCG/ACT inhaler Inhale 1-2 puffs into the lungs every 6 (six) hours as needed for wheezing or shortness of breath. 18 g Carlisle Beers, FNP      PDMP not reviewed this encounter.   Carlisle Beers, [place] 07/22/23 1005

## 2023-07-23 LAB — SARS CORONAVIRUS 2 (TAT 6-24 HRS): SARS Coronavirus 2: NEGATIVE

## 2024-09-30 ENCOUNTER — Emergency Department (HOSPITAL_COMMUNITY)

## 2024-09-30 ENCOUNTER — Encounter (HOSPITAL_COMMUNITY): Payer: Self-pay

## 2024-09-30 ENCOUNTER — Other Ambulatory Visit: Payer: Self-pay

## 2024-09-30 ENCOUNTER — Emergency Department (HOSPITAL_COMMUNITY)
Admission: EM | Admit: 2024-09-30 | Discharge: 2024-09-30 | Disposition: A | Discharge status: Home / Self Care | Attending: Emergency Medicine | Admitting: Emergency Medicine

## 2024-09-30 DIAGNOSIS — S39012A Strain of muscle, fascia and tendon of lower back, initial encounter: Secondary | ICD-10-CM

## 2024-09-30 DIAGNOSIS — X500XXA Overexertion from strenuous movement or load, initial encounter: Secondary | ICD-10-CM | POA: Diagnosis not present

## 2024-09-30 DIAGNOSIS — M545 Low back pain, unspecified: Secondary | ICD-10-CM | POA: Diagnosis present

## 2024-09-30 DIAGNOSIS — Y9389 Activity, other specified: Secondary | ICD-10-CM | POA: Diagnosis not present

## 2024-09-30 DIAGNOSIS — T148XXA Other injury of unspecified body region, initial encounter: Secondary | ICD-10-CM

## 2024-09-30 LAB — CBC WITH DIFFERENTIAL/PLATELET
Abs Immature Granulocytes: 0.06 K/uL (ref 0.00–0.07)
Basophils Absolute: 0 K/uL (ref 0.0–0.1)
Basophils Relative: 0 %
Eosinophils Absolute: 0.2 K/uL (ref 0.0–0.5)
Eosinophils Relative: 2 %
HCT: 44.4 % (ref 39.0–52.0)
Hemoglobin: 15.7 g/dL (ref 13.0–17.0)
Immature Granulocytes: 1 %
Lymphocytes Relative: 26 %
Lymphs Abs: 3 K/uL (ref 0.7–4.0)
MCH: 30.5 pg (ref 26.0–34.0)
MCHC: 35.4 g/dL (ref 30.0–36.0)
MCV: 86.4 fL (ref 80.0–100.0)
Monocytes Absolute: 0.9 K/uL (ref 0.1–1.0)
Monocytes Relative: 8 %
Neutro Abs: 7.5 K/uL (ref 1.7–7.7)
Neutrophils Relative %: 63 %
Platelets: 305 K/uL (ref 150–400)
RBC: 5.14 MIL/uL (ref 4.22–5.81)
RDW: 12.6 % (ref 11.5–15.5)
WBC: 11.7 K/uL — ABNORMAL HIGH (ref 4.0–10.5)
nRBC: 0 % (ref 0.0–0.2)

## 2024-09-30 LAB — URINALYSIS, ROUTINE W REFLEX MICROSCOPIC
Bilirubin Urine: NEGATIVE
Glucose, UA: 50 mg/dL — AB
Hgb urine dipstick: NEGATIVE
Ketones, ur: NEGATIVE mg/dL
Leukocytes,Ua: NEGATIVE
Nitrite: NEGATIVE
Protein, ur: NEGATIVE mg/dL
Specific Gravity, Urine: 1.02 (ref 1.005–1.030)
pH: 6 (ref 5.0–8.0)

## 2024-09-30 LAB — BASIC METABOLIC PANEL WITH GFR
Anion gap: 13 (ref 5–15)
BUN: 12 mg/dL (ref 6–20)
CO2: 26 mmol/L (ref 22–32)
Calcium: 9.1 mg/dL (ref 8.9–10.3)
Chloride: 100 mmol/L (ref 98–111)
Creatinine, Ser: 0.93 mg/dL (ref 0.61–1.24)
GFR, Estimated: >60 mL/min (ref >60)
Glucose, Bld: 127 mg/dL — ABNORMAL HIGH (ref 70–99)
Potassium: 4.2 mmol/L (ref 3.5–5.1)
Sodium: 139 mmol/L (ref 135–145)

## 2024-09-30 MED ORDER — ONDANSETRON HCL 4 MG/2ML IJ SOLN
4.0000 mg | Freq: Once | INTRAMUSCULAR | Status: AC
  Administered 2024-09-30: 4 mg via INTRAVENOUS
  Filled 2024-09-30: qty 2

## 2024-09-30 MED ORDER — OXYCODONE-ACETAMINOPHEN 5-325 MG PO TABS: 1.0000 | ORAL_TABLET | ORAL | 0 refills | Status: AC | PRN

## 2024-09-30 MED ORDER — METHOCARBAMOL 500 MG PO TABS
500.0000 mg | ORAL_TABLET | Freq: Once | ORAL | Status: AC
  Administered 2024-09-30: 500 mg via ORAL
  Filled 2024-09-30: qty 1

## 2024-09-30 MED ORDER — KETOROLAC TROMETHAMINE 30 MG/ML IJ SOLN
30.0000 mg | Freq: Once | INTRAMUSCULAR | Status: AC
  Administered 2024-09-30: 30 mg via INTRAVENOUS
  Filled 2024-09-30: qty 1

## 2024-09-30 MED ORDER — METHOCARBAMOL 500 MG PO TABS: 500.0000 mg | ORAL_TABLET | Freq: Three times a day (TID) | ORAL | 0 refills | Status: AC

## 2024-09-30 MED ORDER — IBUPROFEN 800 MG PO TABS: 800.0000 mg | ORAL_TABLET | Freq: Three times a day (TID) | ORAL | 0 refills | Status: AC

## 2024-09-30 MED ORDER — HYDROMORPHONE HCL 1 MG/ML IJ SOLN
1.0000 mg | Freq: Once | INTRAMUSCULAR | Status: AC
  Administered 2024-09-30: 1 mg via INTRAVENOUS
  Filled 2024-09-30: qty 1

## 2024-09-30 NOTE — Discharge Instructions (Addendum)
 You have been prescribed muscle relaxer, anti-inflammatory medication and pain medication.  I recommend that you alternate ice on and off to the affected area of your back.  Avoid twisting bending or heavy lifting for at least 1 week.  Please follow-up with your primary care provider for recheck I have also listed a local orthopedic that you may contact to arrange follow-up appointment as needed.  Return to ER for any new or worsening symptoms.

## 2024-09-30 NOTE — ED Notes (Signed)
 Pt ambulatory to bathroom without assistance from staff, gave urine sample and walked back to room- PA aware.

## 2024-09-30 NOTE — ED Notes (Signed)
 Pt asking when he will be discharged- [name], PA made aware.

## 2024-09-30 NOTE — ED Provider Notes (Signed)
 Patient signed out to me from [facility], [place] pending CT imaging and urinalysis, suspected musculoskeletal source of back pain but further testing completed to rule out kidney stones or other injury as source of symptoms.  Negative for acute findings, he does have some degenerative disc disease at the L5-S1 region, but this is not the site of his pain.  He was ambulated in department without discomfort.  Patient will be prescribed Robaxin , oxycodone  and ibuprofen , role of ice and heat were discussed.  Referrals were given for follow-up care if symptoms persist despite today's treatment plan.    Results for orders placed or performed during the hospital encounter of 09/30/24  CBC with Differential   Collection Time: 09/30/24  6:18 PM  Result Value Ref Range   WBC 11.7 (H) 4.0 - 10.5 K/uL   RBC 5.14 4.22 - 5.81 MIL/uL   Hemoglobin 15.7 13.0 - 17.0 g/dL   HCT 55.5 60.9 - 47.9 %   MCV 86.4 80.0 - 100.0 fL   MCH 30.5 26.0 - 34.0 pg   MCHC 35.4 30.0 - 36.0 g/dL   RDW 87.3 88.4 - 84.4 %   Platelets 305 150 - 400 K/uL   nRBC 0.0 0.0 - 0.2 %   Neutrophils Relative % 63 %   Neutro Abs 7.5 1.7 - 7.7 K/uL   Lymphocytes Relative 26 %   Lymphs Abs 3.0 0.7 - 4.0 K/uL   Monocytes Relative 8 %   Monocytes Absolute 0.9 0.1 - 1.0 K/uL   Eosinophils Relative 2 %   Eosinophils Absolute 0.2 0.0 - 0.5 K/uL   Basophils Relative 0 %   Basophils Absolute 0.0 0.0 - 0.1 K/uL   Immature Granulocytes 1 %   Abs Immature Granulocytes 0.06 0.00 - 0.07 K/uL  Basic metabolic panel   Collection Time: 09/30/24  6:18 PM  Result Value Ref Range   Sodium 139 135 - 145 mmol/L   Potassium 4.2 3.5 - 5.1 mmol/L   Chloride 100 98 - 111 mmol/L   CO2 26 22 - 32 mmol/L   Glucose, Bld 127 (H) 70 - 99 mg/dL   BUN 12 6 - 20 mg/dL   Creatinine, Ser 9.06 0.61 - 1.24 mg/dL   Calcium  9.1 8.9 - 10.3 mg/dL   GFR, Estimated >39 >39 mL/min   Anion gap 13 5 - 15  Urinalysis, Routine w reflex microscopic -Urine, Clean Catch    Collection Time: 09/30/24  7:39 PM  Result Value Ref Range   Color, Urine YELLOW YELLOW   APPearance CLEAR CLEAR   Specific Gravity, Urine 1.020 1.005 - 1.030   pH 6.0 5.0 - 8.0   Glucose, UA 50 (A) NEGATIVE mg/dL   Hgb urine dipstick NEGATIVE NEGATIVE   Bilirubin Urine NEGATIVE NEGATIVE   Ketones, ur NEGATIVE NEGATIVE mg/dL   Protein, ur NEGATIVE NEGATIVE mg/dL   Nitrite NEGATIVE NEGATIVE   Leukocytes,Ua NEGATIVE NEGATIVE   CT L-SPINE NO CHARGE Result Date: 09/30/2024 CLINICAL DATA:  Back pain. EXAM: CT LUMBAR SPINE WITHOUT CONTRAST TECHNIQUE: Multidetector CT imaging of the lumbar spine was performed without intravenous contrast administration. Multiplanar CT image reconstructions were also generated. RADIATION DOSE REDUCTION: This exam was performed according to the departmental dose-optimization program which includes automated exposure control, adjustment of the mA and/or kV according to patient size and/or use of iterative reconstruction technique. COMPARISON:  None Available. FINDINGS: Segmentation: 5 lumbar type vertebrae. Alignment: No acute subluxation.  Grade 1 L5-S1 retrolisthesis. Vertebrae: No acute fracture. Paraspinal and other soft  tissues: Negative. Disc levels: No acute findings. Multilevel small Schmorl nodes. There is degenerative changes with diffuse disc bulge at L5-S1. IMPRESSION: 1. No acute/traumatic lumbar spine pathology. 2. Degenerative changes with diffuse disc bulge at L5-S1. Electronically Signed   By: Vanetta Chou M.D.   On: 09/30/2024 18:37   CT Renal Stone Study Result Date: 09/30/2024 CLINICAL DATA:  Right flank pain. EXAM: CT ABDOMEN AND PELVIS WITHOUT CONTRAST TECHNIQUE: Multidetector CT imaging of the abdomen and pelvis was performed following the standard protocol without IV contrast. RADIATION DOSE REDUCTION: This exam was performed according to the departmental dose-optimization program which includes automated exposure control, adjustment of the mA  and/or kV according to patient size and/or use of iterative reconstruction technique. COMPARISON:  01/17/2023 FINDINGS: Lower chest: Mild dependent atelectasis or scarring. Hepatobiliary: No mass visualized on this unenhanced exam. Gallbladder is unremarkable. No evidence of biliary ductal dilatation. Pancreas: No mass or inflammatory process visualized on this unenhanced exam. Spleen:  Within normal limits in size. Adrenals/Urinary tract: Chronic diffuse left renal parenchymal atrophy again seen. No evidence of urolithiasis or hydronephrosis. Unremarkable unopacified urinary bladder. Stomach/Bowel: No evidence of obstruction, inflammatory process, or abnormal fluid collections. Normal appendix visualized. Vascular/Lymphatic: No pathologically enlarged lymph nodes identified. No evidence of abdominal aortic aneurysm. Reproductive:  No mass or other significant abnormality. Other:  None. Musculoskeletal:  No suspicious bone lesions identified. IMPRESSION: No evidence of urolithiasis, hydronephrosis, or other acute findings. Chronic left renal atrophy. Electronically Signed   By: Norleen DELENA Kil M.D.   On: 09/30/2024 18:33      Birdena Clarity, PA-C 09/30/24 2132

## 2024-09-30 NOTE — ED Triage Notes (Signed)
 [facility] reports pt coming from home for back pain. Pt reports the pain started yesterday, has a history of back pain. Pt states he fell on the porch on the way to the car. No recent trauma.

## 2024-09-30 NOTE — ED Provider Notes (Signed)
 [place] EMERGENCY DEPARTMENT AT [facility] Provider Note   CSN: [id number] Arrival date & time: 09/30/24  1559     Patient presents with: Back Pain   Jason Sheppard is a 43 y.o. male.    Back Pain      Jason Sheppard is a 42 y.o. male who presents to the Emergency Department complaining of right sided mid to low back pain that began yesterday after lifting a large wooden log.  Felt a sharp pain running laterally (lower back mid back.  Pain worsens with movement, improves at rest.  He states that he slipped and fell on his porch earlier today while trying to get into his car.  He denies any abdominal pain, head injury or LOC, urine or bowel changes, numbness or weakness or pain radiating into his lower extremities.  Denies any chest pain or shortness of breath.  Prior to Admission medications   Medication Sig Start Date End Date Taking? Authorizing Provider  albuterol  (VENTOLIN  HFA) 108 (90 Base) MCG/ACT inhaler Inhale 1-2 puffs into the lungs every 6 (six) hours as needed for wheezing or shortness of breath. 07/22/23   Enedelia Dorna HERO, FNP  amoxicillin -clavulanate (AUGMENTIN ) 875-125 MG tablet Take 1 tablet by mouth every 12 (twelve) hours. 07/22/23   Enedelia Dorna HERO, FNP  azithromycin  (ZITHROMAX ) 250 MG tablet Take 1 tablet (250 mg total) by mouth daily. Take first 2 tablets together, then 1 every day until finished. 07/22/23   Enedelia Dorna HERO, FNP  benzonatate  (TESSALON ) 100 MG capsule Take 1 capsule (100 mg total) by mouth every 8 (eight) hours. 07/22/23   Enedelia Dorna HERO, FNP  bictegravir-emtricitabine -tenofovir  AF (BIKTARVY ) 50-200-25 MG TABS tablet Take by mouth daily.    [provider]  diclofenac Sodium (VOLTAREN) 1 % GEL Apply topically. 08/24/22   [provider]  dicyclomine  (BENTYL ) 20 MG tablet Take 1 tablet (20 mg total) by mouth 2 (two) times daily. Patient taking differently: Take 10 mg by mouth 3 (three) times daily  as needed for spasms. 10/05/22   Silver Fell A, PA  DULoxetine  (CYMBALTA ) 60 MG capsule Take 60 mg by mouth daily. 05/24/21   [provider]  empagliflozin (JARDIANCE) 25 MG TABS tablet TAKE ONE-HALF TABLET BY MOUTH DAILY FOR DIABETES Patient not taking: Reported on 07/10/2023 08/24/22   [provider]  ezetimibe  (ZETIA ) 10 MG tablet TAKE ONE-HALF TABLET BY MOUTH DAILY FOR CHOLESTEROL Patient not taking: Reported on 07/10/2023 11/01/22   [provider]  fluticasone  (FLONASE ) 50 MCG/ACT nasal spray Place 2 sprays into both nostrils daily. Patient not taking: Reported on 02/16/2023 04/06/22   Leath-Warren, Etta PARAS, NP  hydrOXYzine (ATARAX) 10 MG tablet Take by mouth as needed. 04/23/23   [provider]  lisinopril  (ZESTRIL ) 10 MG tablet Take 20 mg by mouth daily. 06/20/22   [provider]  metFORMIN (GLUCOPHAGE) 500 MG tablet Take 500 mg by mouth 2 (two) times daily with a meal.    [provider]  methocarbamol  (ROBAXIN ) 500 MG tablet Take 1 tablet (500 mg total) by mouth every 8 (eight) hours as needed for muscle spasms. 02/13/22   Theadore Ozell HERO, MD  montelukast  (SINGULAIR ) 10 MG tablet Take 1 tablet (10 mg total) by mouth every morning. 02/27/19   Cheryle Page, MD  mupirocin  ointment (BACTROBAN ) 2 % Apply 1 Application topically 2 (two) times daily. 06/25/22   Leath-Warren, Etta PARAS, NP  naproxen  (NAPROSYN ) 500 MG tablet Take by mouth. 08/31/22  [provider]  ondansetron  (ZOFRAN -ODT) 4 MG disintegrating tablet Take 1 tablet (4 mg total) by mouth every 8 (eight) hours as needed for nausea or vomiting. 07/10/23   Stuart Vernell Norris, PA-C  oxyCODONE  (OXY IR/ROXICODONE ) 5 MG immediate release tablet Take 5 mg by mouth 3 (three) times daily as needed for severe pain. 06/27/22   [provider]  prazosin  (MINIPRESS ) 2 MG capsule Take 4 mg by mouth at bedtime. 05/25/22   [provider]    Allergies: Sulfa antibiotics,  Atorvastatin , Keppra [levetiracetam], Oxcarbazepine, Iodine, Latex, and Statins    Review of Systems  Musculoskeletal:  Positive for back pain.  All other systems reviewed and are negative.   Updated Vital Signs BP (!) 143/96   Pulse 79   Temp 98.8 F (37.1 C) (Oral)   Resp 18   SpO2 97%   Physical Exam Vitals and nursing note reviewed.  Constitutional:      General: He is not in acute distress.    Appearance: Normal appearance. He is not ill-appearing or toxic-appearing.  Cardiovascular:     Rate and Rhythm: Normal rate and regular rhythm.     Pulses: Normal pulses.  Pulmonary:     Effort: Pulmonary effort is normal.  Abdominal:     Palpations: Abdomen is soft.  Musculoskeletal:        General: Tenderness and signs of injury present. No swelling or deformity.       Arms:     Comments: Lateral ttp along the right thoracic and lumbar paraspinal muscles.  Positive SLR on right. No midline tenderness, no step offs.  ROM of the bilateral hips  Skin:    General: Skin is warm.  Neurological:     Mental Status: He is alert.     (all labs ordered are listed, but only abnormal results are displayed) Labs Reviewed  CBC WITH DIFFERENTIAL/PLATELET - Abnormal; Notable for the following components:      Result Value   WBC 11.7 (*)    All other components within normal limits  BASIC METABOLIC PANEL WITH GFR - Abnormal; Notable for the following components:   Glucose, Bld 127 (*)    All other components within normal limits  URINALYSIS, ROUTINE W REFLEX MICROSCOPIC    EKG: None  Radiology: CT L-SPINE NO CHARGE Result Date: 09/30/2024 CLINICAL DATA:  Back pain. EXAM: CT LUMBAR SPINE WITHOUT CONTRAST TECHNIQUE: Multidetector CT imaging of the lumbar spine was performed without intravenous contrast administration. Multiplanar CT image reconstructions were also generated. RADIATION DOSE REDUCTION: This exam was performed according to the departmental dose-optimization program  which includes automated exposure control, adjustment of the mA and/or kV according to patient size and/or use of iterative reconstruction technique. COMPARISON:  None Available. FINDINGS: Segmentation: 5 lumbar type vertebrae. Alignment: No acute subluxation.  Grade 1 L5-S1 retrolisthesis. Vertebrae: No acute fracture. Paraspinal and other soft tissues: Negative. Disc levels: No acute findings. Multilevel small Schmorl nodes. There is degenerative changes with diffuse disc bulge at L5-S1. IMPRESSION: 1. No acute/traumatic lumbar spine pathology. 2. Degenerative changes with diffuse disc bulge at L5-S1. Electronically Signed   By: Vanetta Chou M.D.   On: 09/30/2024 18:37   CT Renal Stone Study Result Date: 09/30/2024 CLINICAL DATA:  Right flank pain. EXAM: CT ABDOMEN AND PELVIS WITHOUT CONTRAST TECHNIQUE: Multidetector CT imaging of the abdomen and pelvis was performed following the standard protocol without IV contrast. RADIATION DOSE REDUCTION: This exam was performed according to the departmental dose-optimization program which includes  automated exposure control, adjustment of the mA and/or kV according to patient size and/or use of iterative reconstruction technique. COMPARISON:  01/17/2023 FINDINGS: Lower chest: Mild dependent atelectasis or scarring. Hepatobiliary: No mass visualized on this unenhanced exam. Gallbladder is unremarkable. No evidence of biliary ductal dilatation. Pancreas: No mass or inflammatory process visualized on this unenhanced exam. Spleen:  Within normal limits in size. Adrenals/Urinary tract: Chronic diffuse left renal parenchymal atrophy again seen. No evidence of urolithiasis or hydronephrosis. Unremarkable unopacified urinary bladder. Stomach/Bowel: No evidence of obstruction, inflammatory process, or abnormal fluid collections. Normal appendix visualized. Vascular/Lymphatic: No pathologically enlarged lymph nodes identified. No evidence of abdominal aortic aneurysm.  Reproductive:  No mass or other significant abnormality. Other:  None. Musculoskeletal:  No suspicious bone lesions identified. IMPRESSION: No evidence of urolithiasis, hydronephrosis, or other acute findings. Chronic left renal atrophy. Electronically Signed   By: Norleen DELENA Kil M.D.   On: 09/30/2024 18:33     Procedures   Medications Ordered in the ED  ketorolac  (TORADOL ) 30 MG/ML injection 30 mg (has no administration in time range)  methocarbamol  (ROBAXIN ) tablet 500 mg (has no administration in time range)  HYDROmorphone  (DILAUDID ) injection 1 mg (1 mg Intravenous Given 09/30/24 1816)  ondansetron  (ZOFRAN ) injection 4 mg (4 mg Intravenous Given 09/30/24 1815)                                    Medical Decision Making   Patient here from home for evaluation of pain laterally along the right thoracic and lumbar region.  Pain began yesterday after attempting to lift a large log on a fire.  Pain gradually worsens with movement and improves at rest.  He denies any abdominal pain pain numbness or weakness of his lower extremities urine or bowel changes or saddle anesthesias.  He does endorse a fall earlier today on his porch.  He was already having pain of his lower back prior to the fall. On my exam, pain seems to be localized to the right thoracic and lumbar paraspinal muscles.  There is no midline tenderness or bony step-offs.  He has positive straight leg raise on the right, range of motion of the bilateral hips intact.  I suspect his injuries are musculoskeletal, since he did have a fall today we will obtain CT imaging, labs and urinalysis.  No red flags suggesting a cauda equina  Amount and/or Complexity of Data Reviewed Labs: ordered.    Details: Labs are unremarkable, urinalysis is pending Radiology: ordered.    Details: CT renal stone study without evidence of acute findings  CT and CT L-spine without acute or traumatic lumbar spine pathology there is degenerative changes with disc  bulge at L5-S1 Discussion of management or test interpretation with external provider(s):  Discussed with Julie Idol, PA-C who agrees to review urinalysis results and dispo.  I anticipate discharge home and recommend close outpatient follow-up with his PCP or with orthopedics.  He was given return precautions  Risk Prescription drug management.        Final diagnoses:  Muscle strain  Strain of lumbar region, initial encounter    ED Discharge Orders     None          Herlinda Milling, [place] 09/30/24 1947

## 2024-11-11 ENCOUNTER — Other Ambulatory Visit: Payer: Self-pay

## 2024-11-11 ENCOUNTER — Emergency Department (HOSPITAL_COMMUNITY)

## 2024-11-11 ENCOUNTER — Encounter (HOSPITAL_COMMUNITY): Payer: Self-pay

## 2024-11-11 ENCOUNTER — Emergency Department (HOSPITAL_COMMUNITY)
Admission: EM | Admit: 2024-11-11 | Discharge: 2024-11-11 | Disposition: A | Discharge status: Home / Self Care | Attending: Emergency Medicine | Admitting: Emergency Medicine

## 2024-11-11 DIAGNOSIS — Z21 Asymptomatic human immunodeficiency virus [HIV] infection status: Secondary | ICD-10-CM | POA: Diagnosis not present

## 2024-11-11 DIAGNOSIS — F17200 Nicotine dependence, unspecified, uncomplicated: Secondary | ICD-10-CM | POA: Diagnosis not present

## 2024-11-11 DIAGNOSIS — Z91040 Latex allergy status: Secondary | ICD-10-CM | POA: Diagnosis not present

## 2024-11-11 DIAGNOSIS — Z79899 Other long term (current) drug therapy: Secondary | ICD-10-CM | POA: Diagnosis not present

## 2024-11-11 DIAGNOSIS — I10 Essential (primary) hypertension: Secondary | ICD-10-CM | POA: Insufficient documentation

## 2024-11-11 DIAGNOSIS — R42 Dizziness and giddiness: Secondary | ICD-10-CM | POA: Diagnosis not present

## 2024-11-11 DIAGNOSIS — Z7984 Long term (current) use of oral hypoglycemic drugs: Secondary | ICD-10-CM | POA: Insufficient documentation

## 2024-11-11 DIAGNOSIS — R519 Headache, unspecified: Secondary | ICD-10-CM | POA: Insufficient documentation

## 2024-11-11 DIAGNOSIS — E119 Type 2 diabetes mellitus without complications: Secondary | ICD-10-CM | POA: Diagnosis not present

## 2024-11-11 DIAGNOSIS — R0789 Other chest pain: Secondary | ICD-10-CM | POA: Diagnosis present

## 2024-11-11 LAB — CBC WITH DIFFERENTIAL/PLATELET
Abs Immature Granulocytes: 0.04 K/uL (ref 0.00–0.07)
Basophils Absolute: 0 K/uL (ref 0.0–0.1)
Basophils Relative: 0 %
Eosinophils Absolute: 0.2 K/uL (ref 0.0–0.5)
Eosinophils Relative: 3 %
HCT: 43.4 % (ref 39.0–52.0)
Hemoglobin: 15.2 g/dL (ref 13.0–17.0)
Immature Granulocytes: 1 %
Lymphocytes Relative: 40 %
Lymphs Abs: 2.3 K/uL (ref 0.7–4.0)
MCH: 30 pg (ref 26.0–34.0)
MCHC: 35 g/dL (ref 30.0–36.0)
MCV: 85.6 fL (ref 80.0–100.0)
Monocytes Absolute: 0.5 K/uL (ref 0.1–1.0)
Monocytes Relative: 8 %
Neutro Abs: 2.8 K/uL (ref 1.7–7.7)
Neutrophils Relative %: 48 %
Platelets: 282 K/uL (ref 150–400)
RBC: 5.07 MIL/uL (ref 4.22–5.81)
RDW: 12.6 % (ref 11.5–15.5)
WBC: 5.8 K/uL (ref 4.0–10.5)
nRBC: 0 % (ref 0.0–0.2)

## 2024-11-11 LAB — TROPONIN T, HIGH SENSITIVITY
Troponin T High Sensitivity: 7 ng/L (ref 0–19)
Troponin T High Sensitivity: 7 ng/L (ref 0–19)

## 2024-11-11 LAB — COMPREHENSIVE METABOLIC PANEL WITH GFR
ALT: 33 U/L (ref 0–44)
AST: 21 U/L (ref 15–41)
Albumin: 4.5 g/dL (ref 3.5–5.0)
Alkaline Phosphatase: 65 U/L (ref 38–126)
Anion gap: 12 (ref 5–15)
BUN: 14 mg/dL (ref 6–20)
CO2: 26 mmol/L (ref 22–32)
Calcium: 8.9 mg/dL (ref 8.9–10.3)
Chloride: 101 mmol/L (ref 98–111)
Creatinine, Ser: 0.93 mg/dL (ref 0.61–1.24)
GFR, Estimated: >60 mL/min
Glucose, Bld: 164 mg/dL — ABNORMAL HIGH (ref 70–99)
Potassium: 4.2 mmol/L (ref 3.5–5.1)
Sodium: 138 mmol/L (ref 135–145)
Total Bilirubin: 0.5 mg/dL (ref 0.0–1.2)
Total Protein: 7 g/dL (ref 6.5–8.1)

## 2024-11-11 MED ORDER — KETOROLAC TROMETHAMINE 15 MG/ML IJ SOLN
15.0000 mg | Freq: Once | INTRAMUSCULAR | Status: AC
  Administered 2024-11-11: 15 mg via INTRAVENOUS
  Filled 2024-11-11: qty 1

## 2024-11-11 MED ORDER — PROCHLORPERAZINE EDISYLATE 10 MG/2ML IJ SOLN
10.0000 mg | Freq: Once | INTRAMUSCULAR | Status: AC
  Administered 2024-11-11: 10 mg via INTRAVENOUS
  Filled 2024-11-11: qty 2

## 2024-11-11 MED ORDER — IOHEXOL 350 MG/ML SOLN
75.0000 mL | Freq: Once | INTRAVENOUS | Status: AC | PRN
  Administered 2024-11-11: 75 mL via INTRAVENOUS

## 2024-11-11 MED ORDER — DIPHENHYDRAMINE HCL 50 MG/ML IJ SOLN
25.0000 mg | Freq: Once | INTRAMUSCULAR | Status: AC
  Administered 2024-11-11: 25 mg via INTRAVENOUS
  Filled 2024-11-11: qty 1

## 2024-11-11 MED ORDER — SODIUM CHLORIDE 0.9 % IV BOLUS
1000.0000 mL | Freq: Once | INTRAVENOUS | Status: AC
  Administered 2024-11-11: 1000 mL via INTRAVENOUS

## 2024-11-11 NOTE — ED Triage Notes (Signed)
 Pt arrived via POV from the [facility] for evaluation of chest pain and hypertension X 1 week. Pt describes pain as a squeezing sensation.

## 2024-11-11 NOTE — Discharge Instructions (Signed)
 Please follow-up closely with your primary care doctor on an outpatient basis for reevaluation.  Please continue to monitor your blood pressure at home.  Return to emergency department immediately for any new or worsening symptoms.

## 2024-11-11 NOTE — ED Notes (Signed)
 Pt also reports having floaters recently and did have a CT Scan done while at the [facility] today.

## 2024-11-11 NOTE — ED Provider Notes (Signed)
 " [facility] AT [facility] Provider Note   CSN: [id number] Arrival date & time: 11/11/24  1151     Patient presents with: Chest Pain   Jason Sheppard is a 44 y.o. male.   Patient is a 44 year old male with a past medical history of hypertension, diabetes, HIV (compliant with his Biktarvy ) who presents to the emergency department with a chief complaint of substernal chest pain which he describes as squeezing, headache, dizziness which has been ongoing for approximate the past 5 days.  He was evaluated by his primary care doctor at the [facility] with outpatient CT scan of the head which was unremarkable.  He was referred to the emergency department for further evaluation.  He was given a dose of clonidine in the PCPs office.  He is currently on lisinopril  for blood pressure and has been compliant with this.  Patient notes that he does get some intermittent shortness of breath.  He denies any numbness, paresthesias, unilateral weakness.  He has had no associated syncope.   Chest Pain      Prior to Admission medications  Medication Sig Start Date End Date Taking? Authorizing Provider  albuterol  (VENTOLIN  HFA) 108 (90 Base) MCG/ACT inhaler Inhale 1-2 puffs into the lungs every 6 (six) hours as needed for wheezing or shortness of breath. 07/22/23   Enedelia Dorna HERO, FNP  amoxicillin -clavulanate (AUGMENTIN ) 875-125 MG tablet Take 1 tablet by mouth every 12 (twelve) hours. 07/22/23   Enedelia Dorna HERO, FNP  azithromycin  (ZITHROMAX ) 250 MG tablet Take 1 tablet (250 mg total) by mouth daily. Take first 2 tablets together, then 1 every day until finished. 07/22/23   Enedelia Dorna HERO, FNP  benzonatate  (TESSALON ) 100 MG capsule Take 1 capsule (100 mg total) by mouth every 8 (eight) hours. 07/22/23   Enedelia Dorna HERO, FNP  bictegravir-emtricitabine -tenofovir  AF (BIKTARVY ) 50-200-25 MG TABS tablet Take by mouth daily.    [provider]  dicyclomine   (BENTYL ) 20 MG tablet Take 1 tablet (20 mg total) by mouth 2 (two) times daily. Patient taking differently: Take 10 mg by mouth 3 (three) times daily as needed for spasms. 10/05/22   Silver Wonda LABOR, PA  DULoxetine  (CYMBALTA ) 60 MG capsule Take 60 mg by mouth daily. 05/24/21   [provider]  empagliflozin (JARDIANCE) 25 MG TABS tablet TAKE ONE-HALF TABLET BY MOUTH DAILY FOR DIABETES Patient not taking: Reported on 07/10/2023 08/24/22   [provider]  ezetimibe  (ZETIA ) 10 MG tablet TAKE ONE-HALF TABLET BY MOUTH DAILY FOR CHOLESTEROL Patient not taking: Reported on 07/10/2023 11/01/22   [provider]  fluticasone  (FLONASE ) 50 MCG/ACT nasal spray Place 2 sprays into both nostrils daily. Patient not taking: Reported on 02/16/2023 04/06/22   Leath-Warren, Etta PARAS, NP  hydrOXYzine (ATARAX) 10 MG tablet Take by mouth as needed. 04/23/23   [provider]  ibuprofen  (ADVIL ) 800 MG tablet Take 1 tablet (800 mg total) by mouth 3 (three) times daily. Take with food 09/30/24   Triplett, [name], PA-C  lisinopril  (ZESTRIL ) 10 MG tablet Take 20 mg by mouth daily. 06/20/22   [provider]  metFORMIN (GLUCOPHAGE) 500 MG tablet Take 500 mg by mouth 2 (two) times daily with a meal.    [provider]  methocarbamol  (ROBAXIN ) 500 MG tablet Take 1 tablet (500 mg total) by mouth 3 (three) times daily. 09/30/24   Triplett, [name], PA-C  montelukast  (SINGULAIR ) 10 MG tablet Take 1 tablet (10 mg total) by mouth every morning.  02/27/19   Cheryle Page, MD  mupirocin  ointment (BACTROBAN ) 2 % Apply 1 Application topically 2 (two) times daily. 06/25/22   Leath-Warren, Etta PARAS, NP  ondansetron  (ZOFRAN -ODT) 4 MG disintegrating tablet Take 1 tablet (4 mg total) by mouth every 8 (eight) hours as needed for nausea or vomiting. 07/10/23   Stuart Vernell Norris, PA-C  oxyCODONE -acetaminophen  (PERCOCET/ROXICET) 5-325 MG tablet Take 1 tablet by mouth every 4 (four) hours as needed.  09/30/24   Triplett, [name], PA-C  prazosin  (MINIPRESS ) 2 MG capsule Take 4 mg by mouth at bedtime. 05/25/22   [provider]    Allergies: Sulfa antibiotics, Atorvastatin , Keppra [levetiracetam], Oxcarbazepine, Iodine, Latex, and Statins    Review of Systems  Cardiovascular:  Positive for chest pain.  All other systems reviewed and are negative.   Updated Vital Signs BP (!) 151/104 (BP Location: Right Arm)   Pulse 78   Temp (!) 97.4 F (36.3 C) (Temporal)   Resp 18   Ht 5' 8 (1.727 m)   Wt 84.9 kg   SpO2 100%   BMI 28.45 kg/m   Physical Exam Vitals and nursing note reviewed.  Constitutional:      General: He is not in acute distress.    Appearance: Normal appearance. He is not ill-appearing.  HENT:     Head: Normocephalic and atraumatic.     Nose: Nose normal.     Mouth/Throat:     Mouth: Mucous membranes are moist.  Eyes:     Extraocular Movements: Extraocular movements intact.     Conjunctiva/sclera: Conjunctivae normal.     Pupils: Pupils are equal, round, and reactive to light.  Cardiovascular:     Rate and Rhythm: Normal rate and regular rhythm.     Pulses: Normal pulses.     Heart sounds: Normal heart sounds. No murmur heard. Pulmonary:     Effort: Pulmonary effort is normal. No tachypnea.     Breath sounds: No decreased breath sounds, wheezing, rhonchi or rales.  Chest:     Chest wall: No tenderness.  Abdominal:     General: Abdomen is flat. Bowel sounds are normal.     Palpations: Abdomen is soft.     Tenderness: There is no abdominal tenderness. There is no guarding.  Musculoskeletal:        General: Normal range of motion.     Cervical back: Normal range of motion and neck supple.     Right lower leg: No edema.     Left lower leg: No edema.  Skin:    General: Skin is warm and dry.     Findings: No rash.  Neurological:     General: No focal deficit present.     Mental Status: He is alert and oriented to person, place, and time. Mental  status is at baseline.     Cranial Nerves: No cranial nerve deficit.     Motor: No weakness.  Psychiatric:        Mood and Affect: Mood normal.        Behavior: Behavior normal.        Thought Content: Thought content normal.        Judgment: Judgment normal.     (all labs ordered are listed, but only abnormal results are displayed) Labs Reviewed  COMPREHENSIVE METABOLIC PANEL WITH GFR - Abnormal; Notable for the following components:      Result Value   Glucose, Bld 164 (*)    All other components within normal limits  CBC  WITH DIFFERENTIAL/PLATELET  TROPONIN T, HIGH SENSITIVITY  TROPONIN T, HIGH SENSITIVITY    EKG: EKG Interpretation Date/Time:  Tuesday November 11 2024 11:59:50 EST Ventricular Rate:  77 PR Interval:  144 QRS Duration:  112 QT Interval:  386 QTC Calculation: 436 R Axis:   87  Text Interpretation: Normal sinus rhythm Incomplete right bundle branch block  no significant change since 2024 Confirmed by Freddi Hamilton [id number]) on 11/11/2024 12:05:49 PM  Radiology: [facility] Chest 2 View Result Date: 11/11/2024 CLINICAL DATA:  Chest pain and shortness of breath for 1 week. Smoker. Hypertension. EXAM: CHEST - 2 VIEW COMPARISON:  12/01/2022 FINDINGS: Cardiomediastinal silhouette and pulmonary vasculature are within normal limits. Lungs are clear. IMPRESSION: No acute cardiopulmonary process. Electronically Signed   By: Aliene Lloyd M.D.   On: 11/11/2024 13:02     Procedures   Medications Ordered in the ED  prochlorperazine  (COMPAZINE ) injection 10 mg (has no administration in time range)  diphenhydrAMINE  (BENADRYL ) injection 25 mg (has no administration in time range)  ketorolac  (TORADOL ) 15 MG/ML injection 15 mg (has no administration in time range)  sodium chloride  0.9 % bolus 1,000 mL (has no administration in time range)                                    Medical Decision Making Amount and/or Complexity of Data Reviewed Labs: ordered. Radiology:  ordered.  Risk Prescription drug management.   This patient presents to the ED for concern of chest pain, headache differential diagnosis includes ACS, pulmonary embolus, pericarditis, myocarditis, endocarditis, aortic aneurysm or dissection, pneumonia, pneumothorax, hemothorax, musculoskeletal pain, costochondritis, intracranial hemorrhage, subarachnoid hemorrhage, space-occupying lesion, meningitis, encephalitis, migraine    Additional history obtained:  Additional history obtained from medical records External records from outside source obtained and reviewed including medical records   Lab Tests:  I Ordered, and personally interpreted labs.  The pertinent results include: No leukocytosis, no anemia, normal kidney function liver function, unremarkable electrolytes, negative serial troponins   Imaging Studies ordered:  I ordered imaging studies including CTA head, MRI brain, chest x-ray I independently visualized and interpreted imaging which showed no large vessel occlusion, no ischemic changes within the brain, no acute cardiopulmonary process I agree with the radiologist interpretation   Medicines ordered and prescription drug management:  I ordered medication including Compazine , Benadryl , Toradol , IV fluids for headache and chest pain Reevaluation of the patient after these medicines showed that the patient resolved I have reviewed the patients home medicines and have made adjustments as needed   Problem List / ED Course:  Patient is doing much better at this time and is stable for discharge home.  Symptoms have completely resolved with treatment in the emergency department.  Low suspicion for ACS at this time.  EKG demonstrates no acute ischemic changes and he has negative serial troponins.  MRI brain and CTA head and neck were unremarkable.  Do not suspect subarachnoid hemorrhage, aneurysm, CVA, TIA.  Low suspicion for meningitis or encephalitis in this patient.  He has  been compliant with his Biktarvy .  Blood work is otherwise been unremarkable at this point.  Low suspicion for pulmonary embolus, aortic aneurysm or dissection, endocarditis.  Chest x-ray demonstrates no indication for pneumonia, pneumothorax, hemothorax.  Do not suspect any further emergent workup or imaging is warranted at this time.  Continue symptomatic treatment outpatient basis was discussed as well as the need for close follow-up  with primary care doctor.  Strict turn precautions were discussed for any new or worsening symptoms.  Patient voiced understanding and had no additional questions.   Social Determinants of Health:  None        Final diagnoses:  None    ED Discharge Orders     None          Daralene Lonni BIRCH, [place] 11/11/24 1714  "
